# Patient Record
Sex: Female | Born: 1945 | Race: Black or African American | Hispanic: No | Marital: Married | Smoking: Former smoker
Health system: Southern US, Community
[De-identification: ages and names within clinical notes are randomized; demographics above are authoritative.]

## PROBLEM LIST (undated history)

## (undated) DIAGNOSIS — R0600 Dyspnea, unspecified: Secondary | ICD-10-CM

## (undated) DIAGNOSIS — R42 Dizziness and giddiness: Secondary | ICD-10-CM

## (undated) DIAGNOSIS — J45909 Unspecified asthma, uncomplicated: Secondary | ICD-10-CM

## (undated) DIAGNOSIS — M810 Age-related osteoporosis without current pathological fracture: Secondary | ICD-10-CM

## (undated) DIAGNOSIS — I10 Essential (primary) hypertension: Secondary | ICD-10-CM

## (undated) DIAGNOSIS — M1990 Unspecified osteoarthritis, unspecified site: Secondary | ICD-10-CM

## (undated) DIAGNOSIS — K635 Polyp of colon: Secondary | ICD-10-CM

## (undated) HISTORY — PX: COLONOSCOPY: SHX174

## (undated) HISTORY — PX: OOPHORECTOMY: SHX86

## (undated) HISTORY — PX: ABDOMINAL HYSTERECTOMY: SHX81

## (undated) HISTORY — PX: CHOLECYSTECTOMY: SHX55

---

## 2005-08-26 ENCOUNTER — Ambulatory Visit: Payer: Self-pay | Admitting: Gastroenterology

## 2007-01-27 ENCOUNTER — Ambulatory Visit: Payer: Self-pay | Admitting: Urology

## 2010-12-08 ENCOUNTER — Ambulatory Visit: Payer: Self-pay | Admitting: Gastroenterology

## 2010-12-09 LAB — PATHOLOGY REPORT

## 2014-11-21 ENCOUNTER — Encounter: Payer: Self-pay | Admitting: *Deleted

## 2014-11-22 ENCOUNTER — Ambulatory Visit: Payer: BLUE CROSS/BLUE SHIELD | Admitting: Anesthesiology

## 2014-11-22 ENCOUNTER — Encounter
Admission: RE | Disposition: A | Discharge status: Home / Self Care | Payer: Self-pay | Source: Ambulatory Visit | Attending: Gastroenterology

## 2014-11-22 ENCOUNTER — Ambulatory Visit
Admission: RE | Admit: 2014-11-22 | Discharge: 2014-11-22 | Disposition: A | Discharge status: Home / Self Care | Payer: BLUE CROSS/BLUE SHIELD | Source: Ambulatory Visit | Attending: Gastroenterology | Admitting: Gastroenterology

## 2014-11-22 DIAGNOSIS — Z79899 Other long term (current) drug therapy: Secondary | ICD-10-CM | POA: Diagnosis not present

## 2014-11-22 DIAGNOSIS — M1990 Unspecified osteoarthritis, unspecified site: Secondary | ICD-10-CM | POA: Insufficient documentation

## 2014-11-22 DIAGNOSIS — Z800 Family history of malignant neoplasm of digestive organs: Secondary | ICD-10-CM | POA: Diagnosis not present

## 2014-11-22 DIAGNOSIS — M810 Age-related osteoporosis without current pathological fracture: Secondary | ICD-10-CM | POA: Diagnosis not present

## 2014-11-22 DIAGNOSIS — Z87891 Personal history of nicotine dependence: Secondary | ICD-10-CM | POA: Insufficient documentation

## 2014-11-22 DIAGNOSIS — K5730 Diverticulosis of large intestine without perforation or abscess without bleeding: Secondary | ICD-10-CM | POA: Diagnosis not present

## 2014-11-22 DIAGNOSIS — J45909 Unspecified asthma, uncomplicated: Secondary | ICD-10-CM | POA: Insufficient documentation

## 2014-11-22 DIAGNOSIS — I10 Essential (primary) hypertension: Secondary | ICD-10-CM | POA: Insufficient documentation

## 2014-11-22 DIAGNOSIS — D127 Benign neoplasm of rectosigmoid junction: Secondary | ICD-10-CM | POA: Insufficient documentation

## 2014-11-22 DIAGNOSIS — D123 Benign neoplasm of transverse colon: Secondary | ICD-10-CM | POA: Insufficient documentation

## 2014-11-22 DIAGNOSIS — Z7982 Long term (current) use of aspirin: Secondary | ICD-10-CM | POA: Insufficient documentation

## 2014-11-22 DIAGNOSIS — Z86010 Personal history of colonic polyps: Secondary | ICD-10-CM | POA: Diagnosis present

## 2014-11-22 HISTORY — DX: Age-related osteoporosis without current pathological fracture: M81.0

## 2014-11-22 HISTORY — DX: Unspecified asthma, uncomplicated: J45.909

## 2014-11-22 HISTORY — PX: COLONOSCOPY: SHX5424

## 2014-11-22 HISTORY — DX: Unspecified osteoarthritis, unspecified site: M19.90

## 2014-11-22 HISTORY — DX: Polyp of colon: K63.5

## 2014-11-22 HISTORY — DX: Essential (primary) hypertension: I10

## 2014-11-22 SURGERY — COLONOSCOPY
Anesthesia: General

## 2014-11-22 MED ORDER — PROPOFOL 10 MG/ML IV BOLUS
INTRAVENOUS | Status: DC | PRN
  Administered 2014-11-22: 50 mg via INTRAVENOUS

## 2014-11-22 MED ORDER — SODIUM CHLORIDE 0.9 % IV SOLN
INTRAVENOUS | Status: DC
  Administered 2014-11-22: 1000 mL via INTRAVENOUS

## 2014-11-22 MED ORDER — SODIUM CHLORIDE 0.9 % IV SOLN: INTRAVENOUS | Status: DC

## 2014-11-22 MED ORDER — MIDAZOLAM HCL 5 MG/5ML IJ SOLN
INTRAMUSCULAR | Status: DC | PRN
  Administered 2014-11-22: 1 mg via INTRAVENOUS

## 2014-11-22 MED ORDER — FENTANYL CITRATE (PF) 100 MCG/2ML IJ SOLN
INTRAMUSCULAR | Status: DC | PRN
  Administered 2014-11-22: 50 ug via INTRAVENOUS

## 2014-11-22 MED ORDER — PROPOFOL 500 MG/50ML IV EMUL
INTRAVENOUS | Status: DC | PRN
Start: 2014-11-22 — End: 2014-11-22
  Administered 2014-11-22: 120 ug/kg/min via INTRAVENOUS

## 2014-11-22 NOTE — Anesthesia Postprocedure Evaluation (Signed)
  Anesthesia Post-op Note  Patient: Jaclyn Hall  Procedure(s) Performed: Procedure(s): COLONOSCOPY (N/A)  Anesthesia type:General  Patient location: PACU  Post pain: Pain level controlled  Post assessment: Post-op Vital signs reviewed, Patient's Cardiovascular Status Stable, Respiratory Function Stable, Patent Airway and No signs of Nausea or vomiting  Post vital signs: Reviewed and stable  Last Vitals:  Filed Vitals:   11/22/14 1405  BP: 168/82  Pulse: 56  Temp:   Resp: 16    Level of consciousness: awake, alert  and patient cooperative  Complications: No apparent anesthesia complications

## 2014-11-22 NOTE — Transfer of Care (Signed)
Immediate Anesthesia Transfer of Care Note  Patient: Jaclyn Hall  Procedure(s) Performed: Procedure(s): COLONOSCOPY (N/A)  Patient Location: PACU and Endoscopy Unit  Anesthesia Type:General  Level of Consciousness: awake, alert  and oriented  Airway & Oxygen Therapy: Patient Spontanous Breathing and Patient connected to nasal cannula oxygen  Post-op Assessment: Report given to RN and Post -op Vital signs reviewed and stable  Post vital signs: Reviewed and stable  Last Vitals:  Filed Vitals:   11/22/14 1340  BP:   Pulse:   Temp: 36.9 C  Resp:     Complications: No apparent anesthesia complications

## 2014-11-22 NOTE — H&P (Signed)
Outpatient short stay form Pre-procedure 11/22/2014 12:59 PM Lollie Sails MD  Primary Physician: Dr. Maryland Pink  Reason for visit:  Colonoscopy  History of present illness:  Patient is a 69 year old female presenting today for a colonoscopy. She has a personal history of colon polyps with her last colonoscopy being done in 2012. There is also a family history of colon cancer and a primary relative, a brother. She tolerated her prep well. She takes no blood thinners. She stopped taking take 81 mg aspirin several days ago and takes no other aspirin products.    Current facility-administered medications:  .  0.9 %  sodium chloride infusion, , Intravenous, Continuous, Lollie Sails, MD, Last Rate: 20 mL/hr at 11/22/14 1240, 1,000 mL at 11/22/14 1240 .  0.9 %  sodium chloride infusion, , Intravenous, Continuous, Lollie Sails, MD  Prescriptions prior to admission  Medication Sig Dispense Refill Last Dose  . amLODipine (NORVASC) 5 MG tablet Take 5 mg by mouth daily.     Marland Kitchen aspirin 81 MG tablet Take 81 mg by mouth daily.     . cetirizine (ZYRTEC) 10 MG tablet Take 10 mg by mouth daily.     . fexofenadine (ALLEGRA) 180 MG tablet Take 180 mg by mouth daily.     . fluocinonide ointment (LIDEX) 0.03 % Apply 1 application topically 2 (two) times daily.     . hydrOXYzine (ATARAX/VISTARIL) 25 MG tablet Take 25 mg by mouth 3 (three) times daily as needed.     . meloxicam (MOBIC) 15 MG tablet Take 15 mg by mouth daily.     Marland Kitchen omega-3 acid ethyl esters (LOVAZA) 1 G capsule Take by mouth daily.     Marland Kitchen triamterene-hydrochlorothiazide (MAXZIDE) 75-50 MG tablet Take by mouth daily.     . vitamin B-12 (CYANOCOBALAMIN) 1000 MCG tablet Take 1,000 mcg by mouth daily.        No Known Allergies   Past Medical History  Diagnosis Date  . Arthritis   . Asthma   . Hypertension   . Osteoporosis   . Colon polyps     Review of systems:      Physical Exam    Heart and lungs: Regular rate and  rhythm without rub or gallop, lungs are bilaterally clear    HEENT: Normocephalic atraumatic eyes are anicteric    Other:     Pertinant exam for procedure: Soft nontender nondistended bowel sounds positive normoactive    Planned proceedures: Colonoscopy and indicated procedures I have discussed the risks benefits and complications of procedures to include not limited to bleeding, infection, perforation and the risk of sedation and the patient wishes to proceed.    Lollie Sails, MD Gastroenterology 11/22/2014  12:59 PM

## 2014-11-22 NOTE — Op Note (Signed)
[facility] Gastroenterology Patient Name: Jaclyn Hall Procedure Date: 11/22/2014 1:06 PM MRN: [id number] Account #: [id number] Date of Birth: [birth year] Admit Type: Outpatient Age: 68 Room: [facility] ENDO ROOM 3 Gender: Female Note Status: Finalized Procedure:         Colonoscopy Indications:       Personal history of colonic polyps Providers:         Lollie Sails, MD Referring MD:      Irven Easterly. Kary Kos, MD (Referring MD) Medicines:         Monitored Anesthesia Care Complications:     No immediate complications. Procedure:         Pre-Anesthesia Assessment:                    - ASA Grade Assessment: III - A patient with severe                     systemic disease.                    After obtaining informed consent, the colonoscope was                     passed under direct vision. Throughout the procedure, the                     patient's blood pressure, pulse, and oxygen saturations                     were monitored continuously. The Colonoscope was                     introduced through the anus and advanced to the the cecum,                     identified by appendiceal orifice and ileocecal valve. The                     colonoscopy was performed without difficulty. The patient                     tolerated the procedure well. The quality of the bowel                     preparation was good. Findings:      A 3 mm polyp was found at the splenic flexure. The polyp was sessile.       The polyp was removed with a cold biopsy forceps. Resection and       retrieval were complete.      Four sessile polyps were found in the recto-sigmoid colon. The polyps       were less than 1 mm in size. These polyps were removed with a cold       biopsy forceps. Resection and retrieval were complete.      The retroflexed view of the distal rectum and anal verge was normal and       showed no anal or rectal abnormalities.      The digital rectal exam was normal.  Multiple small-mouthed diverticula were found in the sigmoid colon, in       the descending colon, in the transverse colon and in the ascending colon. Impression:        - One 3 mm polyp at the splenic flexure. Resected and  retrieved.                    - Four less than 1 mm polyps at the recto-sigmoid colon.                     Resected and retrieved.                    - The distal rectum and anal verge are normal on                     retroflexion view. Recommendation:    - Await pathology results.                    - Telephone GI clinic for pathology results in 1 week. Procedure Code(s): --- Professional ---                    [phone], Colonoscopy, flexible; with biopsy, single or                     multiple Diagnosis Code(s): --- Professional ---                    211.3, Benign neoplasm of colon                    211.4, Benign neoplasm of rectum and anal canal                    V12.72, Personal history of colonic polyps CPT copyright 2014 American Medical Association. All rights reserved. The codes documented in this report are preliminary and upon coder review may  be revised to meet current compliance requirements. Lollie Sails, MD 11/22/2014 1:32:47 PM This report has been signed electronically. Number of Addenda: 0 Note Initiated On: 11/22/2014 1:06 PM Scope Withdrawal Time: 0 hours 10 minutes 26 seconds  Total Procedure Duration: 0 hours 17 minutes 46 seconds       [facility]

## 2014-11-22 NOTE — Anesthesia Procedure Notes (Signed)
Date/Time: 11/22/2014 1:10 PM Performed by: Kennon Holter Pre-anesthesia Checklist: Patient identified, Emergency Drugs available, Suction available, Patient being monitored and Timeout performed Oxygen Delivery Method: Nasal cannula Preoxygenation: Pre-oxygenation with 100% oxygen Intubation Type: IV induction

## 2014-11-22 NOTE — Anesthesia Preprocedure Evaluation (Signed)
Anesthesia Evaluation  Patient identified by MRN, date of birth, ID band Patient awake    Reviewed: Allergy & Precautions, H&P , NPO status , Patient's Chart, lab work & pertinent test results  History of Anesthesia Complications Negative for: history of anesthetic complications  Airway Mallampati: III  TM Distance: >3 FB Neck ROM: limited    Dental no notable dental hx. (+) Teeth Intact   Pulmonary neg shortness of breath, asthma , former smoker,    Pulmonary exam normal breath sounds clear to auscultation       Cardiovascular Exercise Tolerance: Good hypertension, (-) angina(-) Past MI Normal cardiovascular exam Rhythm:regular Rate:Normal     Neuro/Psych negative neurological ROS  negative psych ROS   GI/Hepatic negative GI ROS, Neg liver ROS, neg GERD  ,  Endo/Other  negative endocrine ROS  Renal/GU negative Renal ROS  negative genitourinary   Musculoskeletal  (+) Arthritis ,   Abdominal   Peds  Hematology negative hematology ROS (+)   Anesthesia Other Findings Past Medical History:   Arthritis                                                    Asthma                                                       Hypertension                                                 Osteoporosis                                                 Colon polyps                                                Past Surgical History:   ABDOMINAL HYSTERECTOMY                                        CHOLECYSTECTOMY                                               OOPHORECTOMY                                                  COLONOSCOPY  BMI    Body Mass Index   37.80 kg/m 2      Reproductive/Obstetrics negative OB ROS                             Anesthesia Physical Anesthesia Plan  ASA: III  Anesthesia Plan: General   Post-op Pain Management:     Induction:   Airway Management Planned:   Additional Equipment:   Intra-op Plan:   Post-operative Plan:   Informed Consent: I have reviewed the patients History and Physical, chart, labs and discussed the procedure including the risks, benefits and alternatives for the proposed anesthesia with the patient or authorized representative who has indicated his/her understanding and acceptance.   Dental Advisory Given  Plan Discussed with: Anesthesiologist, CRNA and Surgeon  Anesthesia Plan Comments:         Anesthesia Quick Evaluation

## 2014-11-25 ENCOUNTER — Encounter: Payer: Self-pay | Admitting: Gastroenterology

## 2014-11-26 LAB — SURGICAL PATHOLOGY

## 2015-06-30 DIAGNOSIS — H8113 Benign paroxysmal vertigo, bilateral: Secondary | ICD-10-CM | POA: Insufficient documentation

## 2015-07-15 ENCOUNTER — Ambulatory Visit: Payer: BLUE CROSS/BLUE SHIELD | Attending: Neurology

## 2015-07-15 VITALS — BP 151/58 | HR 61

## 2015-07-15 DIAGNOSIS — R42 Dizziness and giddiness: Secondary | ICD-10-CM | POA: Insufficient documentation
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
## 2015-07-15 NOTE — Patient Instructions (Signed)
Pt issued VOR x 1 horizontal, 60 seconds x 3, 4 times/day

## 2015-07-23 ENCOUNTER — Ambulatory Visit: Payer: BLUE CROSS/BLUE SHIELD | Attending: Neurology | Admitting: Physical Therapy

## 2015-07-23 DIAGNOSIS — R42 Dizziness and giddiness: Secondary | ICD-10-CM | POA: Insufficient documentation
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
## 2015-07-30 ENCOUNTER — Encounter: Payer: Self-pay | Admitting: Physical Therapy

## 2015-07-30 ENCOUNTER — Ambulatory Visit: Payer: BLUE CROSS/BLUE SHIELD | Admitting: Physical Therapy

## 2015-07-30 DIAGNOSIS — R42 Dizziness and giddiness: Secondary | ICD-10-CM | POA: Diagnosis not present
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
## 2015-08-05 ENCOUNTER — Encounter: Payer: BLUE CROSS/BLUE SHIELD | Admitting: Physical Therapy

## 2015-08-08 ENCOUNTER — Ambulatory Visit: Payer: BLUE CROSS/BLUE SHIELD | Admitting: Physical Therapy

## 2015-08-08 ENCOUNTER — Encounter: Payer: Self-pay | Admitting: Physical Therapy

## 2015-08-08 DIAGNOSIS — R42 Dizziness and giddiness: Secondary | ICD-10-CM
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
## 2015-08-12 ENCOUNTER — Encounter: Payer: BLUE CROSS/BLUE SHIELD | Admitting: Physical Therapy

## 2015-08-19 ENCOUNTER — Encounter: Payer: BLUE CROSS/BLUE SHIELD | Admitting: Physical Therapy

## 2015-08-22 ENCOUNTER — Encounter: Payer: BLUE CROSS/BLUE SHIELD | Admitting: Physical Therapy

## 2015-08-26 ENCOUNTER — Encounter: Payer: BLUE CROSS/BLUE SHIELD | Admitting: Physical Therapy

## 2015-08-29 ENCOUNTER — Encounter: Payer: BLUE CROSS/BLUE SHIELD | Admitting: Physical Therapy

## 2016-02-23 ENCOUNTER — Emergency Department
Admission: EM | Admit: 2016-02-23 | Discharge: 2016-02-23 | Disposition: A | Discharge status: Home / Self Care | Payer: BLUE CROSS/BLUE SHIELD | Attending: Emergency Medicine | Admitting: Emergency Medicine

## 2016-02-23 ENCOUNTER — Encounter: Payer: Self-pay | Admitting: Emergency Medicine

## 2016-02-23 ENCOUNTER — Emergency Department: Payer: BLUE CROSS/BLUE SHIELD

## 2016-02-23 DIAGNOSIS — H81392 Other peripheral vertigo, left ear: Secondary | ICD-10-CM | POA: Diagnosis not present

## 2016-02-23 DIAGNOSIS — Z87891 Personal history of nicotine dependence: Secondary | ICD-10-CM | POA: Diagnosis not present

## 2016-02-23 DIAGNOSIS — R42 Dizziness and giddiness: Secondary | ICD-10-CM

## 2016-02-23 DIAGNOSIS — I10 Essential (primary) hypertension: Secondary | ICD-10-CM | POA: Insufficient documentation

## 2016-02-23 DIAGNOSIS — Z79899 Other long term (current) drug therapy: Secondary | ICD-10-CM | POA: Diagnosis not present

## 2016-02-23 DIAGNOSIS — J45909 Unspecified asthma, uncomplicated: Secondary | ICD-10-CM | POA: Insufficient documentation

## 2016-02-23 HISTORY — DX: Dizziness and giddiness: R42

## 2016-02-23 LAB — CBC
HCT: 43.5 % (ref 35.0–47.0)
Hemoglobin: 15.2 g/dL (ref 12.0–16.0)
MCH: 31 pg (ref 26.0–34.0)
MCHC: 35 g/dL (ref 32.0–36.0)
MCV: 88.6 fL (ref 80.0–100.0)
PLATELETS: 277 10*3/uL (ref 150–440)
RBC: 4.91 MIL/uL (ref 3.80–5.20)
RDW: 13 % (ref 11.5–14.5)
WBC: 4.3 10*3/uL (ref 3.6–11.0)

## 2016-02-23 LAB — BASIC METABOLIC PANEL
Anion gap: 8 (ref 5–15)
BUN: 16 mg/dL (ref 6–20)
CHLORIDE: 104 mmol/L (ref 101–111)
CO2: 25 mmol/L (ref 22–32)
CREATININE: 0.96 mg/dL (ref 0.44–1.00)
Calcium: 9.7 mg/dL (ref 8.9–10.3)
GFR calc Af Amer: >60 mL/min (ref >60)
GFR calc non Af Amer: 59 mL/min — ABNORMAL LOW (ref >60)
GLUCOSE: 93 mg/dL (ref 65–99)
Potassium: 3.7 mmol/L (ref 3.5–5.1)
Sodium: 137 mmol/L (ref 135–145)

## 2016-02-23 MED ORDER — ONDANSETRON HCL 4 MG PO TABS: 4.0000 mg | ORAL_TABLET | Freq: Three times a day (TID) | ORAL | 0 refills | Status: DC | PRN

## 2016-02-23 MED ORDER — MECLIZINE HCL 12.5 MG PO TABS: 12.5000 mg | ORAL_TABLET | Freq: Three times a day (TID) | ORAL | 1 refills | Status: DC | PRN

## 2016-02-23 MED ORDER — MECLIZINE HCL 25 MG PO TABS
25.0000 mg | ORAL_TABLET | Freq: Once | ORAL | Status: AC
  Administered 2016-02-23: 25 mg via ORAL
  Filled 2016-02-23: qty 1

## 2016-02-23 MED ORDER — ONDANSETRON 4 MG PO TBDP
4.0000 mg | ORAL_TABLET | Freq: Once | ORAL | Status: AC
  Administered 2016-02-23: 4 mg via ORAL
  Filled 2016-02-23: qty 1

## 2016-02-23 NOTE — ED Notes (Signed)
D/c inst to pt and family.   

## 2016-02-23 NOTE — Discharge Instructions (Signed)
Please return immediately if condition worsens. Please contact her primary physician or the physician you were given for referral. If you have any specialist physicians involved in her treatment and plan please also contact them. Thank you for using [place] regional emergency Department. ° °

## 2016-02-23 NOTE — ED Provider Notes (Signed)
Time Seen: Approximately1843 I have reviewed the triage notes  Chief Complaint: Dizziness   History of Present Illness: Jaclyn Hall is a 71 y.o. female who states a long history of intermittent peripheral vertigo. She has seen a neurologist in the past for similar symptoms. She states she was referred here by her primary physician for evaluation of vertigo since it seemed to be relatively persistent with intermittent episodes over the past week. No focal weakness, trouble with speech or swallowing. She denies any trouble with ambulation. Spinning sensation is exclusively with movement. She denies any fever. She denies any head trauma. She describes her dizziness is exclusively when she looks to the left.  Past Medical History:  Diagnosis Date  . Arthritis   . Asthma   . Colon polyps   . Hypertension   . Osteoporosis   . Vertigo     There are no active problems to display for this patient.   Past Surgical History:  Procedure Laterality Date  . ABDOMINAL HYSTERECTOMY    . CHOLECYSTECTOMY    . COLONOSCOPY    . COLONOSCOPY N/A 11/22/2014   Procedure: COLONOSCOPY;  Surgeon: Lollie Sails, MD;  Location: [facility] ENDOSCOPY;  Service: Endoscopy;  Laterality: N/A;  . OOPHORECTOMY      Past Surgical History:  Procedure Laterality Date  . ABDOMINAL HYSTERECTOMY    . CHOLECYSTECTOMY    . COLONOSCOPY    . COLONOSCOPY N/A 11/22/2014   Procedure: COLONOSCOPY;  Surgeon: Lollie Sails, MD;  Location: [facility] ENDOSCOPY;  Service: Endoscopy;  Laterality: N/A;  . OOPHORECTOMY      Current Outpatient Rx  . Order #: [id number] Class: Historical Med  . Order #: [id number] Class: Historical Med  . Order #: [id number] Class: Historical Med  . Order #: [id number] Class: Historical Med  . Order #: [id number] Class: Historical Med  . Order #: [id number] Class: Historical Med  . Order #: [id number] Class: Historical Med  . Order #: [id number] Class: Historical Med  . Order #: [id number] Class:  Historical Med  . Order #: [id number] Class: Historical Med  . Order #: [id number] Class: Historical Med  . Order #: [id number] Class: Historical Med    Allergies:  Patient has no known allergies.  Family History: No family history on file.  Social History: Social History  Substance Use Topics  . Smoking status: Former Research scientist (life sciences)  . Smokeless tobacco: Never Used  . Alcohol use No     Review of Systems:   10 point review of systems was performed and was otherwise negative:  Constitutional: No fever Eyes: No visual disturbances. No blind spots ENT: No sore throat, ear pain Cardiac: No chest pain Respiratory: No shortness of breath, wheezing, or stridor Abdomen: No abdominal pain, no vomiting, No diarrhea Endocrine: No weight loss, No night sweats Extremities: No peripheral edema, cyanosis Skin: No rashes, easy bruising Neurologic: No focal weakness, trouble with speech or swollowing Urologic: No dysuria, Hematuria, or urinary frequency   Physical Exam:  ED Triage Vitals [02/23/16 1433]  Enc Vitals Group     BP (!) 194/64     Pulse Rate 71     Resp 20     Temp 98.3 F (36.8 C)     Temp Source Oral     SpO2 100 %     Weight 190 lb (86.2 kg)     Height 5\' 1"  (1.549 m)     Head Circumference      Peak Flow      Pain Score      Pain Loc  Pain Edu?      Excl. in [place]?     General: Awake , Alert , and Oriented times 3; GCS 15 Head: Normal cephalic , atraumatic Eyes: Pupils equal , round, reactive to light. Right lateral nystagmus which is exhaustive Nose/Throat: No nasal drainage, patent upper airway without erythema or exudate.  Neck: Supple, Full range of motion, No anterior adenopathy or palpable thyroid masses Lungs: Clear to ascultation without wheezes , rhonchi, or rales Heart: Regular rate, regular rhythm without murmurs , gallops , or rubs Abdomen: Soft, non tender without rebound, guarding , or rigidity; bowel sounds positive and symmetric in all 4 quadrants.  No organomegaly .        Extremities: 2 plus symmetric pulses. No edema, clubbing or cyanosis Neurologic: normal ambulation, Motor symmetric without deficits, sensory intact Skin: warm, dry, no rashes   Labs:   All laboratory work was reviewed including any pertinent negatives or positives listed below:  Labs Reviewed  BASIC METABOLIC PANEL - Abnormal; Notable for the following:       Result Value   GFR calc non Af Amer 59 (*)    All other components within normal limits  CBC  Laboratory work was reviewed and showed no clinically significant abnormalities.   EKG:  ED ECG REPORT I, Daymon Larsen, the attending physician, personally viewed and interpreted this ECG.  Date: 02/23/2016 EKG Time: 1402 Rate: 62 Rhythm: normal sinus rhythm QRS Axis: normal Intervals: normal ST/T Wave abnormalities: normal Conduction Disturbances: none Narrative Interpretation: unremarkable Left ventricular hypertrophy  No acute ischemic changes   Radiology: * "Ct Head Wo Contrast  Result Date: 02/23/2016 CLINICAL DATA:  Dizziness and vertigo EXAM: CT HEAD WITHOUT CONTRAST TECHNIQUE: Contiguous axial images were obtained from the base of the skull through the vertex without intravenous contrast. COMPARISON:  None. FINDINGS: Brain: No mass lesion, intraparenchymal hemorrhage or extra-axial collection. No evidence of acute cortical infarct. Brain parenchyma and CSF-containing spaces are normal for age. Vascular: No hyperdense vessel or unexpected calcification. Skull: Normal visualized skull base, calvarium and extracranial soft tissues. Sinuses/Orbits: No sinus fluid levels or advanced mucosal thickening. No mastoid effusion. Normal orbits. IMPRESSION: Normal head CT. Electronically Signed   By: Ulyses Jarred M.D.   On: 02/23/2016 19:32  "   I personally reviewed the radiologic studies     ED Course:  Patient's stay here was uneventful and I felt her vertigo is most likely peripheral vertigo  based on her clinical presentation and objective findings. Patient was advised continue with outpatient follow-up and was prescribed Antivert and Zofran for nausea. She was advised take Tylenol for the mild abdominal pain that she has some dry heaves. She was advised to return here if she has any focal weakness, increasing headache, persistent vertiginous symptoms even if she is lying still in the bed.     Assessment:  Peripheral vertigo     Plan: * Outpatient " New Prescriptions   MECLIZINE (ANTIVERT) 12.5 MG TABLET    Take 1 tablet (12.5 mg total) by mouth 3 (three) times daily as needed for dizziness or nausea.   ONDANSETRON (ZOFRAN) 4 MG TABLET    Take 1 tablet (4 mg total) by mouth every 8 (eight) hours as needed for nausea or vomiting.  " Patient was advised to return immediately if condition worsens. Patient was advised to follow up with their primary care physician or other specialized physicians involved in their outpatient care. The patient and/or family member/power of attorney had laboratory  results reviewed at the bedside. All questions and concerns were addressed and appropriate discharge instructions were distributed by the nursing staff.             Daymon Larsen, MD 02/23/16 2002

## 2016-02-23 NOTE — ED Notes (Signed)
Pt reports dizziness for 2 years.  Pt states recent episode lasting for 1 week.  Pt taking meclizine without relief. No chest pain or sob.  Intermittent nausea.  Pt alert.  Speech clear.  Family with pt.

## 2016-02-23 NOTE — ED Triage Notes (Signed)
Pt with vertigo since last week, comes and goes. Has been taking meclizine and is not helping. Pt states her dr sent her here.

## 2016-04-14 ENCOUNTER — Ambulatory Visit
Admission: RE | Admit: 2016-04-14 | Discharge: 2016-04-14 | Disposition: A | Discharge status: Home / Self Care | Payer: BLUE CROSS/BLUE SHIELD | Source: Ambulatory Visit | Attending: Medical | Admitting: Medical

## 2016-04-14 ENCOUNTER — Encounter: Payer: Self-pay | Admitting: Medical

## 2016-04-14 ENCOUNTER — Ambulatory Visit: Payer: Self-pay | Admitting: Medical

## 2016-04-14 VITALS — BP 200/88 | HR 66 | Temp 97.6°F | Resp 16 | Ht 62.0 in | Wt 207.0 lb

## 2016-04-14 DIAGNOSIS — J40 Bronchitis, not specified as acute or chronic: Secondary | ICD-10-CM

## 2016-04-14 DIAGNOSIS — R05 Cough: Secondary | ICD-10-CM | POA: Diagnosis present

## 2016-04-14 DIAGNOSIS — R062 Wheezing: Secondary | ICD-10-CM | POA: Diagnosis not present

## 2016-04-14 DIAGNOSIS — R0602 Shortness of breath: Secondary | ICD-10-CM | POA: Diagnosis not present

## 2016-04-14 DIAGNOSIS — R059 Cough, unspecified: Secondary | ICD-10-CM

## 2016-04-14 MED ORDER — BENZONATATE 100 MG PO CAPS: 100.0000 mg | ORAL_CAPSULE | Freq: Two times a day (BID) | ORAL | 0 refills | Status: DC | PRN

## 2016-04-14 MED ORDER — ALBUTEROL SULFATE HFA 108 (90 BASE) MCG/ACT IN AERS: 2.0000 | INHALATION_SPRAY | Freq: Four times a day (QID) | RESPIRATORY_TRACT | 1 refills | Status: DC | PRN

## 2016-04-14 NOTE — Progress Notes (Signed)
Subjective:    Patient ID: Jaclyn Hall, female    DOB: February 05, 1945, 71 y.o.   MRN: [id number]  HPI Started 10 days with cough and bodyaches, stayed in bed for  3 days, with fever and chills ( "flu like symtoms").  Seemed to get better after 3 days but now has a lingering cough that is productive white and clear and thick.She has an expired albuterol MDI and would like a refill for her wheezing. Her grandchildren were over her house the weekend before and they had the flu and this is where she thinks she got it.    Review of Systems  Constitutional: Negative for chills, fatigue and fever.  HENT: Positive for congestion and sneezing. Negative for sore throat.   Eyes: Negative.   Respiratory: Positive for cough, chest tightness, shortness of breath and wheezing.   Cardiovascular: Negative for chest pain.  Gastrointestinal: Positive for nausea. Negative for diarrhea, rectal pain and vomiting.  Genitourinary: Negative for frequency and hematuria.  Musculoskeletal: Positive for back pain and myalgias.  Skin: Negative for rash and wound.  Neurological: Positive for dizziness, light-headedness and headaches. Negative for syncope.  Hematological: Negative for adenopathy.  Psychiatric/Behavioral: Negative for hallucinations and self-injury.   A lot of these are symptoms she says she had last week. Back pain and body aches, nausea, shortness of breath during the  3 days she spent in bed.. She continues to wheeze so that is what brought her in today. Now with soreness in sides of chest and back from coughing so much.  Patient had taken her maxide and norvasc at 5:30 am today.    Objective:   Physical Exam  Constitutional: She is oriented to person, place, and time. She appears well-developed and well-nourished.  HENT:  Head: Normocephalic and atraumatic.  Eyes: EOM are normal. Pupils are equal, round, and reactive to light.  Neck: Normal range of motion. Neck supple.  Cardiovascular: Normal  rate, regular rhythm and normal heart sounds.  Exam reveals no gallop and no friction rub.   No murmur heard. Pulmonary/Chest: Effort normal. She has wheezes.  Musculoskeletal: Normal range of motion.  Lymphadenopathy:    She has no cervical adenopathy.  Neurological: She is alert and oriented to person, place, and time.  Skin: Skin is warm and dry.  Psychiatric: She has a normal mood and affect. Her behavior is normal.  Nursing note and vitals reviewed.   s/p albuterol nebuliz treatment wheezing resolved but rhonci present.. Patient in no distress and states she feels better.      Assessment & Plan:  Viral Bronchitis s/p influenza ( last week). Albuterol nebulizer in clinic, wheezing  Almost resolved but still present on inspiration some rhonchi in bases. Sent patient for chest xray. Chest xray within normal limits. Called patient with results.  Prescribed Albuterol MDI  2 puffs every 6 hours as needed for cough, wheezing or shortness of breath. Benzonatate perles 100mg  one -two by mouth every eight hours #30 no rx Tussionex 30ml by mouth every 12 hours may cause sedation 30cc no rx Reviewed with patient to use one or the other of the cough medications , she verbalizes understanding. Since she had already taken her blood pressure medication , I recommended she contact her PCP and see if she could take another Norvasc 5mg  pill.  I also reviewed with patient that headaches and dizziness can be a symptom of high blood pressure. She says she will call him today. She is to return to  the clinic as needed.

## 2016-04-21 ENCOUNTER — Ambulatory Visit: Payer: Self-pay | Admitting: Medical

## 2017-03-14 ENCOUNTER — Encounter: Payer: Self-pay | Admitting: Medical

## 2017-03-14 ENCOUNTER — Ambulatory Visit: Payer: Self-pay | Admitting: Registered Nurse

## 2017-03-14 VITALS — BP 182/75 | HR 60 | Temp 98.2°F | Resp 18 | Ht 62.0 in | Wt 200.0 lb

## 2017-03-14 DIAGNOSIS — J302 Other seasonal allergic rhinitis: Secondary | ICD-10-CM

## 2017-03-14 DIAGNOSIS — M810 Age-related osteoporosis without current pathological fracture: Secondary | ICD-10-CM | POA: Insufficient documentation

## 2017-03-14 DIAGNOSIS — H6993 Unspecified Eustachian tube disorder, bilateral: Secondary | ICD-10-CM

## 2017-03-14 DIAGNOSIS — R42 Dizziness and giddiness: Secondary | ICD-10-CM

## 2017-03-14 DIAGNOSIS — I10 Essential (primary) hypertension: Secondary | ICD-10-CM | POA: Insufficient documentation

## 2017-03-14 DIAGNOSIS — M1990 Unspecified osteoarthritis, unspecified site: Secondary | ICD-10-CM | POA: Insufficient documentation

## 2017-03-14 DIAGNOSIS — T7840XA Allergy, unspecified, initial encounter: Secondary | ICD-10-CM | POA: Insufficient documentation

## 2017-03-14 DIAGNOSIS — H6983 Other specified disorders of Eustachian tube, bilateral: Secondary | ICD-10-CM

## 2017-03-14 MED ORDER — ACETAMINOPHEN 500 MG PO TABS: 1000.0000 mg | ORAL_TABLET | Freq: Four times a day (QID) | ORAL | 0 refills | Status: AC | PRN

## 2017-03-14 MED ORDER — FLUCONAZOLE 150 MG PO TABS: 150.0000 mg | ORAL_TABLET | Freq: Once | ORAL | 0 refills | Status: AC

## 2017-03-14 MED ORDER — AMOXICILLIN-POT CLAVULANATE 875-125 MG PO TABS: 1.0000 | ORAL_TABLET | Freq: Two times a day (BID) | ORAL | 0 refills | Status: DC

## 2017-03-14 NOTE — Patient Instructions (Signed)
Start taking meclizine 12.5mg  by mouth every 12 hours Continue allegra 180mg  by mouth daily If worsening ear pain despite allegra and meclizine and tylenol start augmentin Vaginal Yeast infection, Adult Vaginal yeast infection is a condition that causes soreness, swelling, and redness (inflammation) of the vagina. It also causes vaginal discharge. This is a common condition. Some women get this infection frequently. What are the causes? This condition is caused by a change in the normal balance of the yeast (candida) and bacteria that live in the vagina. This change causes an overgrowth of yeast, which causes the inflammation. What increases the risk? This condition is more likely to develop in: Women who take antibiotic medicines. Women who have diabetes. Women who take birth control pills. Women who are pregnant. Women who douche often. Women who have a weak defense (immune) system. Women who have been taking steroid medicines for a long time. Women who frequently wear tight clothing.  What are the signs or symptoms? Symptoms of this condition include: White, thick vaginal discharge. Swelling, itching, redness, and irritation of the vagina. The lips of the vagina (vulva) may be affected as well. Pain or a burning feeling while urinating. Pain during sex.  How is this diagnosed? This condition is diagnosed with a medical history and physical exam. This will include a pelvic exam. Your health care provider will examine a sample of your vaginal discharge under a microscope. Your health care provider may send this sample for testing to confirm the diagnosis. How is this treated? This condition is treated with medicine. Medicines may be over-the-counter or prescription. You may be told to use one or more of the following: Medicine that is taken orally. Medicine that is applied as a cream. Medicine that is inserted directly into the vagina (suppository).  Follow these instructions at  home: Take or apply over-the-counter and prescription medicines only as told by your health care provider. Do not have sex until your health care provider has approved. Tell your sex partner that you have a yeast infection. That person should go to his or her health care provider if he or she develops symptoms. Do not wear tight clothes, such as pantyhose or tight pants. Avoid using tampons until your health care provider approves. Eat more yogurt. This may help to keep your yeast infection from returning. Try taking a sitz bath to help with discomfort. This is a warm water bath that is taken while you are sitting down. The water should only come up to your hips and should cover your buttocks. Do this 3-4 times per day or as told by your health care provider. Do not douche. Wear breathable, cotton underwear. If you have diabetes, keep your blood sugar levels under control. Contact a health care provider if: You have a fever. Your symptoms go away and then return. Your symptoms do not get better with treatment. Your symptoms get worse. You have new symptoms. You develop blisters in or around your vagina. You have blood coming from your vagina and it is not your menstrual period. You develop pain in your abdomen. This information is not intended to replace advice given to you by your health care provider. Make sure you discuss any questions you have with your health care provider. Document Released: 10/14/2004 Document Revised: 06/18/2015 Document Reviewed: 07/08/2014 Elsevier Interactive Patient Education  2018 [employer].   Vertigo Vertigo is the feeling that you or your surroundings are moving when they are not. Vertigo can be dangerous if it occurs while  you are doing something that could endanger you or others, such as driving. What are the causes? This condition is caused by a disturbance in the signals that are sent by your body's sensory systems to your brain. Different causes of  a disturbance can lead to vertigo, including: Infections, especially in the inner ear. A bad reaction to a drug, or misuse of alcohol and medicines. Withdrawal from drugs or alcohol. Quickly changing positions, as when lying down or rolling over in bed. Migraine headaches. Decreased blood flow to the brain. Decreased blood pressure. Increased pressure in the brain from a head or neck injury, stroke, infection, tumor, or bleeding. Central nervous system disorders.  What are the signs or symptoms? Symptoms of this condition usually occur when you move your head or your eyes in different directions. Symptoms may start suddenly, and they usually last for less than a minute. Symptoms may include: Loss of balance and falling. Feeling like you are spinning or moving. Feeling like your surroundings are spinning or moving. Nausea and vomiting. Blurred vision or double vision. Difficulty hearing. Slurred speech. Dizziness. Involuntary eye movement (nystagmus).  Symptoms can be mild and cause only slight annoyance, or they can be severe and interfere with daily life. Episodes of vertigo may return (recur) over time, and they are often triggered by certain movements. Symptoms may improve over time. How is this diagnosed? This condition may be diagnosed based on medical history and the quality of your nystagmus. Your health care provider may test your eye movements by asking you to quickly change positions to trigger the nystagmus. This may be called the Dix-Hallpike test, head thrust test, or roll test. You may be referred to a health care provider who specializes in ear, nose, and throat (ENT) problems (otolaryngologist) or a provider who specializes in disorders of the central nervous system (neurologist). You may have additional testing, including: A physical exam. Blood tests. MRI. A CT scan. An electrocardiogram (ECG). This records electrical activity in your heart. An electroencephalogram  (EEG). This records electrical activity in your brain. Hearing tests.  How is this treated? Treatment for this condition depends on the cause and the severity of the symptoms. Treatment options include: Medicines to treat nausea or vertigo. These are usually used for severe cases. Some medicines that are used to treat other conditions may also reduce or eliminate vertigo symptoms. These include: Medicines that control allergies (antihistamines). Medicines that control seizures (anticonvulsants). Medicines that relieve depression (antidepressants). Medicines that relieve anxiety (sedatives). Head movements to adjust your inner ear back to normal. If your vertigo is caused by an ear problem, your health care provider may recommend certain movements to correct the problem. Surgery. This is rare.  Follow these instructions at home: Safety Move slowly.Avoid sudden body or head movements. Avoid driving. Avoid operating heavy machinery. Avoid doing any tasks that would cause danger to you or others if you would have a vertigo episode during the task. If you have trouble walking or keeping your balance, try using a cane for stability. If you feel dizzy or unstable, sit down right away. Return to your normal activities as told by your health care provider. Ask your health care provider what activities are safe for you. General instructions Take over-the-counter and prescription medicines only as told by your health care provider. Avoid certain positions or movements as told by your health care provider. Drink enough fluid to keep your urine clear or pale yellow. Keep all follow-up visits as told by your health  care provider. This is important. Contact a health care provider if: Your medicines do not relieve your vertigo or they make it worse. You have a fever. Your condition gets worse or you develop new symptoms. Your family or friends notice any behavioral changes. Your nausea or vomiting  gets worse. You have numbness or a "pins and needles" sensation in part of your body. Get help right away if: You have difficulty moving or speaking. You are always dizzy. You faint. You develop severe headaches. You have weakness in your hands, arms, or legs. You have changes in your hearing or vision. You develop a stiff neck. You develop sensitivity to light. This information is not intended to replace advice given to you by your health care provider. Make sure you discuss any questions you have with your health care provider. Document Released: 10/14/2004 Document Revised: 06/18/2015 Document Reviewed: 04/29/2014 Elsevier Interactive Patient Education  2018 [employer]. Otitis Media, Adult Otitis media occurs when there is inflammation and fluid in the middle ear. Your middle ear is a part of the ear that contains bones for hearing as well as air that helps send sounds to your brain. What are the causes? This condition is caused by a blockage in the eustachian tube. This tube drains fluid from the ear to the back of the nose (nasopharynx). A blockage in this tube can be caused by an object or by swelling (edema) in the tube. Problems that can cause a blockage include:  A cold or other upper respiratory infection.  Allergies.  An irritant, such as tobacco smoke.  Enlarged adenoids. The adenoids are areas of soft tissue located high in the back of the throat, behind the nose and the roof of the mouth.  A mass in the nasopharynx.  Damage to the ear caused by pressure changes (barotrauma).  What are the signs or symptoms? Symptoms of this condition include:  Ear pain.  A fever.  Decreased hearing.  A headache.  Tiredness (lethargy).  Fluid leaking from the ear.  Ringing in the ear.  How is this diagnosed? This condition is diagnosed with a physical exam. During the exam your health care provider will use an instrument called an otoscope to look into your ear and  check for redness, swelling, and fluid. He or she will also ask about your symptoms. Your health care provider may also order tests, such as:  A test to check the movement of the eardrum (pneumatic otoscopy). This test is done by squeezing a small amount of air into the ear.  A test that changes air pressure in the middle ear to check how well the eardrum moves and whether the eustachian tube is working (tympanogram).  How is this treated? This condition usually goes away on its own within 3-5 days. But if the condition is caused by a bacteria infection and does not go away own its own, or keeps coming back, your health care provider may:  Prescribe antibiotic medicines to treat the infection.  Prescribe or recommend medicines to control pain.  Follow these instructions at home:  Take over-the-counter and prescription medicines only as told by your health care provider.  If you were prescribed an antibiotic medicine, take it as told by your health care provider. Do not stop taking the antibiotic even if you start to feel better.  Keep all follow-up visits as told by your health care provider. This is important. Contact a health care provider if:  You have bleeding from your nose.  There is a lump on your neck.  You are not getting better in 5 days.  You feel worse instead of better. Get help right away if:  You have severe pain that is not controlled with medicine.  You have swelling, redness, or pain around your ear.  You have stiffness in your neck.  A part of your face is paralyzed.  The bone behind your ear (mastoid) is tender when you touch it.  You develop a severe headache. Summary  Otitis media is redness, soreness, and swelling of the middle ear.  This condition usually goes away on its own within 3-5 days.  If the problem does not go away in 3-5 days, your health care provider may prescribe or recommend medicines to treat your symptoms.  If you were  prescribed an antibiotic medicine, take it as told by your health care provider. This information is not intended to replace advice given to you by your health care provider. Make sure you discuss any questions you have with your health care provider. Document Released: 10/10/2003 Document Revised: 12/26/2015 Document Reviewed: 12/26/2015 Elsevier Interactive Patient Education  2018 [place]. Eustachian Tube Dysfunction The eustachian tube connects the middle ear to the back of the nose. It regulates air pressure in the middle ear by allowing air to move between the ear and nose. It also helps to drain fluid from the middle ear space. When the eustachian tube does not function properly, air pressure, fluid, or both can build up in the middle ear. Eustachian tube dysfunction can affect one or both ears. What are the causes? This condition happens when the eustachian tube becomes blocked or cannot open normally. This may result from:  Ear infections.  Colds and other upper respiratory infections.  Allergies.  Irritation, such as from cigarette smoke or acid from the stomach coming up into the esophagus (gastroesophageal reflux).  Sudden changes in air pressure, such as from descending in an airplane.  Abnormal growths in the nose or throat, such as nasal polyps, tumors, or enlarged tissue at the back of the throat (adenoids).  What increases the risk? This condition may be more likely to develop in people who smoke and people who are overweight. Eustachian tube dysfunction may also be more likely to develop in children, especially children who have:  Certain birth defects of the mouth, such as cleft palate.  Large tonsils and adenoids.  What are the signs or symptoms? Symptoms of this condition may include:  A feeling of fullness in the ear.  Ear pain.  Clicking or popping noises in the ear.  Ringing in the ear.  Hearing loss.  Loss of balance.  Symptoms may get worse  when the air pressure around you changes, such as when you travel to an area of high elevation or fly on an airplane. How is this diagnosed? This condition may be diagnosed based on:  Your symptoms.  A physical exam of your ear, nose, and throat.  Tests, such as those that measure: ? The movement of your eardrum (tympanogram). ? Your hearing (audiometry).  How is this treated? Treatment depends on the cause and severity of your condition. If your symptoms are mild, you may be able to relieve your symptoms by moving air into ("popping") your ears. If you have symptoms of fluid in your ears, treatment may include:  Decongestants.  Antihistamines.  Nasal sprays or ear drops that contain medicines that reduce swelling (steroids).  In some cases, you may need to have  a procedure to drain the fluid in your eardrum (myringotomy). In this procedure, a small tube is placed in the eardrum to:  Drain the fluid.  Restore the air in the middle ear space.  Follow these instructions at home:  Take over-the-counter and prescription medicines only as told by your health care provider.  Use techniques to help pop your ears as recommended by your health care provider. These may include: ? Chewing gum. ? Yawning. ? Frequent, forceful swallowing. ? Closing your mouth, holding your nose closed, and gently blowing as if you are trying to blow air out of your nose.  Do not do any of the following until your health care provider approves: ? Travel to high altitudes. ? Fly in airplanes. ? Work in a [employer] scheme manager or room. ? Scuba dive.  Keep your ears dry. Dry your ears completely after showering or bathing.  Do not smoke.  Keep all follow-up visits as told by your health care provider. This is important. Contact a health care provider if:  Your symptoms do not go away after treatment.  Your symptoms come back after treatment.  You are unable to pop your ears.  You have: ? A  fever. ? Pain in your ear. ? Pain in your head or neck. ? Fluid draining from your ear.  Your hearing suddenly changes.  You become very dizzy.  You lose your balance. This information is not intended to replace advice given to you by your health care provider. Make sure you discuss any questions you have with your health care provider. Document Released: 01/31/2015 Document Revised: 06/12/2015 Document Reviewed: 01/23/2014 Elsevier Interactive Patient Education  [employer].

## 2017-03-14 NOTE — Progress Notes (Signed)
Subjective:    Patient ID: Jaclyn Hall, female    DOB: [birth year], 72 y.o.   MRN: [id number]  71y/o african [place] female established patient here for right ear pain and vertigo.  Has been taking meclizine once a day and allegra daily.  Thinks she has an ear infection.  PMHx seasonal spring allergies      Review of Systems  Constitutional: Negative for activity change, appetite change, chills, diaphoresis, fatigue, fever and unexpected weight change.  HENT: Positive for ear pain and postnasal drip. Negative for congestion, dental problem, drooling, ear discharge, facial swelling, hearing loss, mouth sores, nosebleeds, rhinorrhea, sinus pressure, sinus pain, sneezing, sore throat, tinnitus, trouble swallowing and voice change.   Eyes: Negative for photophobia, pain, discharge, redness, itching and visual disturbance.  Respiratory: Negative for cough, choking, chest tightness, shortness of breath, wheezing and stridor.   Cardiovascular: Negative for chest pain, palpitations and leg swelling.  Gastrointestinal: Negative for abdominal distention, abdominal pain, blood in stool, constipation, diarrhea, nausea and vomiting.  Endocrine: Negative for cold intolerance and heat intolerance.  Genitourinary: Negative for difficulty urinating, dysuria and hematuria.  Musculoskeletal: Negative for arthralgias, back pain, gait problem, joint swelling, myalgias, neck pain and neck stiffness.  Skin: Negative for color change, pallor, rash and wound.  Allergic/Immunologic: Positive for environmental allergies. Negative for food allergies.  Neurological: Positive for dizziness. Negative for tremors, seizures, syncope, facial asymmetry, speech difficulty, weakness, light-headedness, numbness and headaches.  Hematological: Negative for adenopathy. Does not bruise/bleed easily.  Psychiatric/Behavioral: Negative for agitation, confusion and sleep disturbance.       Objective:   Physical Exam    Constitutional: She is oriented to person, place, and time. She appears well-developed and well-nourished. She is active and cooperative.  Non-toxic appearance. She does not have a sickly appearance. She appears ill. No distress.  HENT:  Head: Normocephalic and atraumatic.  Right Ear: Hearing, external ear and ear canal normal. A middle ear effusion is present.  Left Ear: Hearing, external ear and ear canal normal. A middle ear effusion is present.  Nose: Mucosal edema and rhinorrhea present. No nose lacerations, sinus tenderness, nasal deformity, septal deviation or nasal septal hematoma. No epistaxis.  No foreign bodies. Right sinus exhibits no maxillary sinus tenderness and no frontal sinus tenderness. Left sinus exhibits no maxillary sinus tenderness and no frontal sinus tenderness.  Mouth/Throat: Uvula is midline and mucous membranes are normal. Mucous membranes are not pale, not dry and not cyanotic. She does not have dentures. No oral lesions. No trismus in the jaw. Normal dentition. No dental abscesses, uvula swelling, lacerations or dental caries. Posterior oropharyngeal edema and posterior oropharyngeal erythema present. No oropharyngeal exudate or tonsillar abscesses.  Cobblestoning posterior pharynx; bilateral TMs air fluid level right 75% opacity bulging compared to left only opacity at 6 oclock; bilateral nasal turbinates edema erythema scant clear yellow discharge; bilateral allergic shiners  Eyes: Conjunctivae, EOM and lids are normal. Pupils are equal, round, and reactive to light. Right eye exhibits no chemosis, no discharge, no exudate and no hordeolum. No foreign body present in the right eye. Left eye exhibits no chemosis, no discharge, no exudate and no hordeolum. No foreign body present in the left eye. Right conjunctiva is not injected. Right conjunctiva has no hemorrhage. Left conjunctiva is not injected. Left conjunctiva has no hemorrhage. No scleral icterus. Right eye exhibits  normal extraocular motion and no nystagmus. Left eye exhibits normal extraocular motion and no nystagmus. Right pupil is round and reactive. Left  pupil is round and reactive. Pupils are equal.  Neck: Trachea normal, normal range of motion and phonation normal. Neck supple. No tracheal tenderness and no muscular tenderness present. No neck rigidity. No tracheal deviation, no edema, no erythema and normal range of motion present. No thyroid mass and no thyromegaly present.  Cardiovascular: Normal rate, regular rhythm, S1 normal, S2 normal, normal heart sounds and intact distal pulses. PMI is not displaced. Exam reveals no gallop and no friction rub.  No murmur heard. Pulmonary/Chest: Effort normal and breath sounds normal. No accessory muscle usage or stridor. No respiratory distress. She has no decreased breath sounds. She has no wheezes. She has no rhonchi. She has no rales. She exhibits no tenderness.  Abdominal: Soft. Normal appearance. She exhibits no distension, no fluid wave and no ascites. There is no rigidity and no guarding.  Musculoskeletal: Normal range of motion. She exhibits no edema or tenderness.       Right shoulder: Normal.       Left shoulder: Normal.       Right elbow: Normal.      Left elbow: Normal.       Right hip: Normal.       Left hip: Normal.       Right knee: Normal.       Left knee: Normal.       Cervical back: Normal.       Thoracic back: She exhibits pain.       Lumbar back: Normal.       Back:       Right hand: Normal.       Left hand: Normal.  Right pain under my shoulder blade at rest or with moving  Lymphadenopathy:       Head (right side): No submental, no submandibular, no tonsillar, no preauricular, no posterior auricular and no occipital adenopathy present.       Head (left side): No submental, no submandibular, no tonsillar, no preauricular, no posterior auricular and no occipital adenopathy present.    She has no cervical adenopathy.       Right  cervical: No superficial cervical, no deep cervical and no posterior cervical adenopathy present.      Left cervical: No superficial cervical, no deep cervical and no posterior cervical adenopathy present.  Neurological: She is alert and oriented to person, place, and time. She has normal strength. She is not disoriented. She displays no atrophy and no tremor. No cranial nerve deficit or sensory deficit. She exhibits normal muscle tone. She displays no seizure activity. Coordination and gait normal. GCS eye subscore is 4. GCS verbal subscore is 5. GCS motor subscore is 6.  In/out of chair without difficulty; slight dizziness requiring assist onto exam table; gait sure and steady on discharge in hallway  Skin: Skin is warm, dry and intact. No abrasion, no bruising, no burn, no ecchymosis, no laceration, no lesion, no petechiae and no rash noted. She is not diaphoretic. No cyanosis or erythema. No pallor. Nails show no clubbing.  Psychiatric: She has a normal mood and affect. Her speech is normal and behavior is normal. Judgment and thought content normal. Cognition and memory are normal.  Vitals reviewed.         Assessment & Plan:  A-eustachian tube dysfunction, vertigo, seasonal allergic rhinitis  P-Patient may use normal saline nasal spray 2 sprays each nostril q2h wa as needed. flonase 13mcg 1 spray each nostril BID OTC.  Patient denied personal or family history of ENT  cancer.  OTC antihistamine of choice allegra 180mg  po daily.  Avoid triggers if possible.  Shower prior to bedtime if exposed to triggers.  If allergic dust/dust mites recommend mattress/pillow covers/encasements; washing linens, vacuuming, sweeping, dusting weekly.  Call or return to clinic as needed if these symptoms worsen or fail to improve as anticipated.   Exitcare handout on allergic rhinitis and sinus rinse given to patient.  Patient verbalized understanding of instructions, agreed with plan of care and had no further  questions at this time.  P2:  Avoidance and hand washing.  Discussed fluid starting to get cloudy could turn into an ear infectionSupportive treatment.   No evidence of invasive bacterial infection, non toxic and well hydrated.  This is most likely self limiting viral infection.  I do not see where any further testing or imaging is necessary at this time.   I will suggest supportive care, rest, good hygiene and encourage the patient to take adequate fluids.  The patient is to return to clinic or EMERGENCY ROOM if symptoms worsen or change significantly e.g. ear pain, fever, purulent discharge from ears or bleeding.  Exitcare handout on otitis media and eustachian tube dysfunction given to patient.  Patient verbalized agreement and understanding of treatment plan and had no further questions at this time.    Discussed with patient otitis media with effusion probably causing vertigo but could also be age. Supportive treatment may take up to 8 doses meclizine 12.5mg  per day max 100mg  per 24 hours.  Discussed signs/symptoms stroke. recommended not driving during vertigo episodes.  Follow up if aphasia, dysphasia, visual changes, weakness, fall, worst headache of life, incoordination, fever, ear discharge.  Consider ENT evaluation/follow up with PCM if worsening symptoms not controlled with meclizine or needs Rx refill.  Patient verbalized understanding of information/agreed with plan of care and had no further questions at this time.

## 2017-06-01 ENCOUNTER — Encounter: Payer: Self-pay | Admitting: Adult Health

## 2017-06-01 ENCOUNTER — Ambulatory Visit: Payer: Self-pay | Admitting: Adult Health

## 2017-06-01 VITALS — BP 137/82 | HR 63 | Temp 98.0°F | Resp 16 | Ht 62.0 in | Wt 203.0 lb

## 2017-06-01 DIAGNOSIS — N898 Other specified noninflammatory disorders of vagina: Secondary | ICD-10-CM

## 2017-06-01 DIAGNOSIS — B372 Candidiasis of skin and nail: Secondary | ICD-10-CM

## 2017-06-01 DIAGNOSIS — R399 Unspecified symptoms and signs involving the genitourinary system: Secondary | ICD-10-CM

## 2017-06-01 LAB — POCT URINALYSIS DIPSTICK
APPEARANCE: NORMAL
Bilirubin, UA: NEGATIVE
Glucose, UA: NEGATIVE
Ketones, UA: NEGATIVE
LEUKOCYTES UA: NEGATIVE
Nitrite, UA: NEGATIVE
PH UA: 8 (ref 5.0–8.0)
PROTEIN UA: NEGATIVE
Spec Grav, UA: 1.025 (ref 1.010–1.025)
UROBILINOGEN UA: 0.2 U/dL

## 2017-06-01 MED ORDER — NYSTATIN 100000 UNIT/GM EX CREA: 1.0000 "application " | TOPICAL_CREAM | Freq: Two times a day (BID) | CUTANEOUS | 1 refills | Status: AC

## 2017-06-01 MED ORDER — SULFAMETHOXAZOLE-TRIMETHOPRIM 800-160 MG PO TABS: 1.0000 | ORAL_TABLET | Freq: Two times a day (BID) | ORAL | 0 refills | Status: DC

## 2017-06-01 NOTE — Patient Instructions (Signed)
Return to office for a repeat urine to be sure blood has resolved 5 days after finishing antibiotic. Call for appointment.   Hematuria, Adult Hematuria is blood in your urine. It can be caused by a bladder infection, kidney infection, prostate infection, kidney stone, or cancer of your urinary tract. Infections can usually be treated with medicine, and a kidney stone usually will pass through your urine. If neither of these is the cause of your hematuria, further workup to find out the reason may be needed. It is very important that you tell your health care provider about any blood you see in your urine, even if the blood stops without treatment or happens without causing pain. Blood in your urine that happens and then stops and then happens again can be a symptom of a very serious condition. Also, pain is not a symptom in the initial stages of many urinary cancers. Follow these instructions at home:  Drink lots of fluid, 3-4 quarts a day. If you have been diagnosed with an infection, cranberry juice is especially recommended, in addition to large amounts of water.  Avoid caffeine, tea, and carbonated beverages because they tend to irritate the bladder.  Avoid alcohol because it may irritate the prostate.  Take all medicines as directed by your health care provider.  If you were prescribed an antibiotic medicine, finish it all even if you start to feel better.  If you have been diagnosed with a kidney stone, follow your health care provider's instructions regarding straining your urine to catch the stone.  Empty your bladder often. Avoid holding urine for long periods of time.  After a bowel movement, women should cleanse front to back. Use each tissue only once.  Empty your bladder before and after sexual intercourse if you are a female. Contact a health care provider if:  You develop back pain.  You have a fever.  You have a feeling of sickness in your stomach (nausea) or  vomiting.  Your symptoms are not better in 3 days. Return sooner if you are getting worse. Get help right away if:  You develop severe vomiting and are unable to keep the medicine down.  You develop severe back or abdominal pain despite taking your medicines.  You begin passing a large amount of blood or clots in your urine.  You feel extremely weak or faint, or you pass out. This information is not intended to replace advice given to you by your health care provider. Make sure you discuss any questions you have with your health care provider. Document Released: 01/04/2005 Document Revised: 06/12/2015 Document Reviewed: 09/04/2012 Elsevier Interactive Patient Education  2017 [employer] Vaginal Yeast infection, Adult Vaginal yeast infection is a condition that causes soreness, swelling, and redness (inflammation) of the vagina. It also causes vaginal discharge. This is a common condition. Some women get this infection frequently. What are the causes? This condition is caused by a change in the normal balance of the yeast (candida) and bacteria that live in the vagina. This change causes an overgrowth of yeast, which causes the inflammation. What increases the risk? This condition is more likely to develop in:  Women who take antibiotic medicines.  Women who have diabetes.  Women who take birth control pills.  Women who are pregnant.  Women who douche often.  Women who have a weak defense (immune) system.  Women who have been taking steroid medicines for a long time.  Women who frequently wear tight clothing.  What are the  signs or symptoms? Symptoms of this condition include:  White, thick vaginal discharge.  Swelling, itching, redness, and irritation of the vagina. The lips of the vagina (vulva) may be affected as well.  Pain or a burning feeling while urinating.  Pain during sex.  How is this diagnosed? This condition is diagnosed with a medical history and  physical exam. This will include a pelvic exam. Your health care provider will examine a sample of your vaginal discharge under a microscope. Your health care provider may send this sample for testing to confirm the diagnosis. How is this treated? This condition is treated with medicine. Medicines may be over-the-counter or prescription. You may be told to use one or more of the following:  Medicine that is taken orally.  Medicine that is applied as a cream.  Medicine that is inserted directly into the vagina (suppository).  Follow these instructions at home:  Take or apply over-the-counter and prescription medicines only as told by your health care provider.  Do not have sex until your health care provider has approved. Tell your sex partner that you have a yeast infection. That person should go to his or her health care provider if he or she develops symptoms.  Do not wear tight clothes, such as pantyhose or tight pants.  Avoid using tampons until your health care provider approves.  Eat more yogurt. This may help to keep your yeast infection from returning.  Try taking a sitz bath to help with discomfort. This is a warm water bath that is taken while you are sitting down. The water should only come up to your hips and should cover your buttocks. Do this 3-4 times per day or as told by your health care provider.  Do not douche.  Wear breathable, cotton underwear.  If you have diabetes, keep your blood sugar levels under control. Contact a health care provider if:  You have a fever.  Your symptoms go away and then return.  Your symptoms do not get better with treatment.  Your symptoms get worse.  You have new symptoms.  You develop blisters in or around your vagina.  You have blood coming from your vagina and it is not your menstrual period.  You develop pain in your abdomen. This information is not intended to replace advice given to you by your health care provider.  Make sure you discuss any questions you have with your health care provider. Document Released: 10/14/2004 Document Revised: 06/18/2015 Document Reviewed: 07/08/2014 Elsevier Interactive Patient Education  2018 [employer]. Urinary Tract Infection, Adult A urinary tract infection (UTI) is an infection of any part of the urinary tract. The urinary tract includes the:  Kidneys.  Ureters.  Bladder.  Urethra.  These organs make, store, and get rid of pee (urine) in the body. Follow these instructions at home:  Take over-the-counter and prescription medicines only as told by your doctor.  If you were prescribed an antibiotic medicine, take it as told by your doctor. Do not stop taking the antibiotic even if you start to feel better.  Avoid the following drinks: ? Alcohol. ? Caffeine. ? Tea. ? Carbonated drinks.  Drink enough fluid to keep your pee clear or pale yellow.  Keep all follow-up visits as told by your doctor. This is important.  Make sure to: ? Empty your bladder often and completely. Do not to hold pee for long periods of time. ? Empty your bladder before and after sex. ? Wipe from front to back  after a bowel movement if you are female. Use each tissue one time when you wipe. Contact a doctor if:  You have back pain.  You have a fever.  You feel sick to your stomach (nauseous).  You throw up (vomit).  Your symptoms do not get better after 3 days.  Your symptoms go away and then come back. Get help right away if:  You have very bad back pain.  You have very bad lower belly (abdominal) pain.  You are throwing up and cannot keep down any medicines or water. This information is not intended to replace advice given to you by your health care provider. Make sure you discuss any questions you have with your health care provider. Document Released: 06/23/2007 Document Revised: 06/12/2015 Document Reviewed: 11/25/2014 Elsevier Interactive Patient Education   [employer].

## 2017-06-01 NOTE — Progress Notes (Addendum)
Subjective:     Patient ID: Jaclyn Hall, female   DOB: May 15, 1945, 72 y.o.   MRN: [id number]  HPI  Patient is a 72 year old female in no acute distress she reports she was treated for  H pylor in April 2019. She is feeling much better now.   She comes in today for burning with urination and urgency  x 5 days- vaginal irritation and inner thigh rash and itching. She has been on Clarithromycin and Amoxicillin for H Pylori and was also on Augmentin in February.   She reports vaginal itching and rash inner thighs present x 2 weeks.  She denies any repeat urinary tract infection.   Patient  denies any fever, body aches,chills, rash, chest pain, shortness of breath, nausea, vomiting, or diarrhea.    Review of Systems  Constitutional: Negative.   HENT: Negative.   Eyes: Negative.   Respiratory: Negative.   Cardiovascular: Negative.   Endocrine: Negative.   Genitourinary: Positive for dysuria, hematuria (by POCT none seen in toilet per patient- denies history of . ) and urgency. Negative for decreased urine volume, difficulty urinating, dyspareunia, enuresis, flank pain, frequency, genital sores, menstrual problem, pelvic pain, vaginal bleeding, vaginal discharge and vaginal pain.  Musculoskeletal: Negative.   Skin: Negative.   Neurological: Negative.   Hematological: Negative.   Psychiatric/Behavioral: Negative.        Objective:   Physical Exam  Constitutional: She is oriented to person, place, and time. She appears well-developed and well-nourished.  HENT:  Head: Normocephalic and atraumatic.  Eyes: Pupils are equal, round, and reactive to light. Conjunctivae and EOM are normal.  Neck: Normal range of motion. Neck supple.  Cardiovascular: Normal rate, regular rhythm, normal heart sounds and intact distal pulses. Exam reveals no gallop and no friction rub.  No murmur heard. Pulmonary/Chest: Effort normal and breath sounds normal. No stridor. No respiratory distress. She has no  wheezes. She has no rales. She exhibits no tenderness.  Abdominal: Soft. Bowel sounds are normal.  Genitourinary:  Genitourinary Comments: No gynecology exams done in this office currently and no equipment available. Patient is aware she will have to see gynecology if needed for any pelvic/vaginal exam and will follow up with gynecology/obgyn as needed. Yearly gynecology pelvic exam recommended. Patient verbalized understanding of instructions and denies any further questions at this time.    Musculoskeletal: Normal range of motion.  Neurological: She is alert and oriented to person, place, and time.  Skin: Skin is warm and dry. Capillary refill takes less than 2 seconds. Rash noted. No cyanosis. Nails show no clubbing.     Bilateral inner thighs with darker moist skin with flat mild erythema  bilateral inner thighs and groin folds   Psychiatric: She has a normal mood and affect. Her behavior is normal. Judgment and thought content normal.  Vitals reviewed.      Assessment:     Urinary symptom or sign - Plan: POCT urinalysis dipstick, Urine Culture  Itching in the vaginal area  Yeast infection of the skin     Results for orders placed or performed in visit on 06/01/17 (from the past 24 hour(s))  POCT urinalysis dipstick     Status: Normal   Collection Time: 06/01/17  3:21 PM  Result Value Ref Range   Color, UA yellow    Clarity, UA clear    Glucose, UA negative    Bilirubin, UA negative    Ketones, UA negative    Spec Grav, UA 1.025 1.010 -  1.025   Blood, UA moderate    pH, UA 8.0 5.0 - 8.0   Protein, UA negative    Urobilinogen, UA 0.2 0.2 or 1.0 E.U./dL   Nitrite, UA negative    Leukocytes, UA Negative Negative   Appearance normal    Odor none       Plan:     Meds ordered this encounter  Medications  . sulfamethoxazole-trimethoprim (BACTRIM DS,SEPTRA DS) 800-160 MG tablet    Sig: Take 1 tablet by mouth 2 (two) times daily.    Dispense:  14 tablet    Refill:  0   . nystatin cream (MYCOSTATIN)    Sig: Apply 1 application topically 2 (two) times daily.    Dispense:  30 g    Refill:  1    Apply Nystatin to outer vaginal area and inner thighs/ groin folds. Discussed eating yogurt with probiotics, keeping skin clean and dry.   Discussed ways  to prevent yeast infection. Also advised having recheck on urine POCT and follow up appointment after 5 days of completing Bactrim.   Advised patient call the office or your primary care doctor for an appointment if no improvement within 72 hours or if any symptoms change or worsen at any time  Advised ER or urgent Care if after hours or on weekend. Call 911 for emergency symptoms at any time.Patinet verbalized understanding of all instructions given/reviewed and treatment plan and has no further questions or concerns at this time.    Patient verbalized understanding of all instructions given and denies any further questions at this time.

## 2017-06-03 LAB — URINE CULTURE

## 2017-06-03 NOTE — Progress Notes (Signed)
No significant bacterial growth - keeping with office visit plan and follow up.

## 2017-06-23 ENCOUNTER — Ambulatory Visit: Payer: Self-pay

## 2017-06-24 ENCOUNTER — Encounter: Payer: Self-pay | Admitting: Adult Health

## 2017-06-24 ENCOUNTER — Ambulatory Visit: Payer: Self-pay | Admitting: Adult Health

## 2017-06-24 VITALS — BP 150/82 | HR 73 | Temp 98.1°F | Resp 16 | Wt 206.2 lb

## 2017-06-24 DIAGNOSIS — Z09 Encounter for follow-up examination after completed treatment for conditions other than malignant neoplasm: Secondary | ICD-10-CM

## 2017-06-24 DIAGNOSIS — R319 Hematuria, unspecified: Secondary | ICD-10-CM

## 2017-06-24 LAB — POCT URINALYSIS DIPSTICK
Bilirubin, UA: NEGATIVE
GLUCOSE UA: NEGATIVE
Ketones, UA: NEGATIVE
LEUKOCYTES UA: NEGATIVE
Nitrite, UA: 0.2
Protein, UA: NEGATIVE
Spec Grav, UA: 1.01 (ref 1.010–1.025)
Urobilinogen, UA: NEGATIVE E.U./dL — AB
pH, UA: 6.5 (ref 5.0–8.0)

## 2017-06-24 NOTE — Patient Instructions (Signed)

## 2017-06-24 NOTE — Progress Notes (Signed)
Subjective:     Patient ID: Jaclyn Hall, female   DOB: February 26, 1945, 72 y.o.   MRN: [id number]  HPI   Blood pressure (!) 150/82, pulse 73, temperature 98.1 F (36.7 C), temperature source Tympanic, resp. rate 16, weight 206 lb 3.2 oz (93.5 kg), SpO2 100 %.  Patient is a 72 year old female in no acute distress who comes to the clinic for follow up urine dipstick and exam after treatment 06/01/17 with Bactrim x 7 days.She also was given Nystatin cream for groin/inner thigh yeast rash. She reports she has no symptoms now and rash inner thighs and groin has resolved.   She finished all of her Bactrim and used Nystatin cream as directed.  She denies any symptoms currently.   She denies any vaginal or rectal bleeding. Denies seeing any blood in urine.  Patient  denies any fever, body aches,chills, rash, chest pain, shortness of breath, nausea, vomiting, or diarrhea.   Today she has no leukocytes in urine dipstick but does have trace hematuria on dipstick. Provider discussed this with patient and recommend urology consult. Patient declines and says she has had this before in past and will see her primary care MD for full physical the first of July and she will discuss with him and have him recheck her urine at that time. Provider completely discussed differential diagnosis not limited to but including possible malignancy. Patient verbalized understanding and still declines referral at this time. She is aware she can call the office at anytime to have this referral placed.    No LMP recorded. Patient has had a hysterectomy.    Review of Systems  Constitutional: Negative.   HENT: Negative.   Eyes: Negative.   Respiratory: Negative.   Cardiovascular: Negative.   Gastrointestinal: Negative.   Endocrine: Negative.   Genitourinary: Negative.   Musculoskeletal: Negative.   Skin: Negative.   Allergic/Immunologic: Negative.   Neurological: Negative.   Hematological: Negative.    Psychiatric/Behavioral: Negative.        Objective:   Physical Exam  Constitutional: She is oriented to person, place, and time. She appears well-developed and well-nourished. No distress.  Patient is alert and oriented and responsive to questions Engages in eye contact with provider. Speaks in full sentences without any pauses without any shortness of breath or distress.    HENT:  Head: Normocephalic and atraumatic.  Mouth/Throat: No oropharyngeal exudate.  Eyes: Pupils are equal, round, and reactive to light. Conjunctivae and EOM are normal.  Neck: Normal range of motion. Neck supple.  Cardiovascular: Normal rate, regular rhythm, normal heart sounds and intact distal pulses. Exam reveals no gallop and no friction rub.  No murmur heard. Pulmonary/Chest: Effort normal and breath sounds normal. No stridor. No respiratory distress. She has no wheezes. She has no rales. She exhibits no tenderness.  Abdominal: Soft. Bowel sounds are normal. She exhibits no distension and no mass. There is no tenderness. There is no rebound and no guarding. No hernia.  Genitourinary:  Genitourinary Comments: No gynecology exams done in this office currently and no equipment available. Patient is aware she will have to see gynecology if needed for any pelvic/vaginal exam and will follow up with gynecology/obgyn as needed. Yearly gynecology pelvic exam recommended. Patient verbalized understanding of instructions and denies any further questions at this time.    Musculoskeletal: Normal range of motion.  Patient moves on and off of exam table and in room without difficulty. Gait is normal in hall and in room. Patient is  oriented to person place time and situation. Patient answers questions appropriately and engages in conversation.   Neurological: She is alert and oriented to person, place, and time.  Patient moves on and off of exam table and in room without difficulty. Gait is normal in hall and in room. Patient  is oriented to person place time and situation. Patient answers questions appropriately and engages in conversation.   Skin: Skin is warm and dry. Capillary refill takes less than 2 seconds. She is not diaphoretic.  Psychiatric: She has a normal mood and affect. Her behavior is normal. Judgment and thought content normal.  Vitals reviewed.      Assessment:     Follow up - Plan: POCT urinalysis dipstick  Results for orders placed or performed in visit on 06/24/17 (from the past 24 hour(s))  POCT urinalysis dipstick     Status: Abnormal   Collection Time: 06/24/17  8:09 AM  Result Value Ref Range   Color, UA yellow    Clarity, UA clear    Glucose, UA Negative Negative   Bilirubin, UA neg    Ketones, UA neg    Spec Grav, UA 1.010 1.010 - 1.025   Blood, UA trace    pH, UA 6.5 5.0 - 8.0   Protein, UA Negative Negative   Urobilinogen, UA negative (A) 0.2 or 1.0 E.U./dL   Nitrite, UA 0.2    Leukocytes, UA Negative Negative   Appearance     Odor        Plan:      Provider recommends referral to urology - patient declines despite education- risks versus benefits. She will call if she decides she would like referral to urology.   Keep July 2019 physical appointment for physical and also discuss hematuria - blood in urine with primary care for further evaluation and work up.   Advised patient call the office or your primary care doctor for an appointment if no improvement within 72 hours or if any symptoms change or worsen at any time  Advised ER or urgent Care if after hours or on weekend. Call 911 for emergency symptoms at any time.Patinet verbalized understanding of all instructions given/reviewed and treatment plan and has no further questions or concerns at this time.    Patient verbalized understanding of all instructions given and denies any further questions at this time.

## 2017-07-18 ENCOUNTER — Ambulatory Visit: Payer: Self-pay | Admitting: Medical

## 2017-07-18 ENCOUNTER — Encounter: Payer: Self-pay | Admitting: Medical

## 2017-07-18 VITALS — BP 170/89 | HR 79 | Temp 98.2°F | Resp 18 | Wt 200.2 lb

## 2017-07-18 DIAGNOSIS — H6983 Other specified disorders of Eustachian tube, bilateral: Secondary | ICD-10-CM

## 2017-07-18 DIAGNOSIS — R42 Dizziness and giddiness: Secondary | ICD-10-CM

## 2017-07-18 DIAGNOSIS — H6993 Unspecified Eustachian tube disorder, bilateral: Secondary | ICD-10-CM

## 2017-07-18 DIAGNOSIS — J302 Other seasonal allergic rhinitis: Secondary | ICD-10-CM

## 2017-07-18 MED ORDER — FLUTICASONE PROPIONATE 50 MCG/ACT NA SUSP: 2.0000 | Freq: Every day | NASAL | 6 refills | Status: AC

## 2017-07-18 MED ORDER — MECLIZINE HCL 12.5 MG PO TABS: 12.5000 mg | ORAL_TABLET | Freq: Three times a day (TID) | ORAL | 0 refills | Status: DC

## 2017-07-18 NOTE — Patient Instructions (Signed)
Allergic Rhinitis, Adult Allergic rhinitis is an allergic reaction that affects the mucous membrane inside the nose. It causes sneezing, a runny or stuffy nose, and the feeling of mucus going down the back of the throat (postnasal drip). Allergic rhinitis can be mild to severe. There are two types of allergic rhinitis:  Seasonal. This type is also called hay fever. It happens only during certain seasons.  Perennial. This type can happen at any time of the year.  What are the causes? This condition happens when the body's defense system (immune system) responds to certain harmless substances called allergens as though they were germs.  Seasonal allergic rhinitis is triggered by pollen, which can come from grasses, trees, and weeds. Perennial allergic rhinitis may be caused by:  House dust mites.  Pet dander.  Mold spores.  What are the signs or symptoms? Symptoms of this condition include:  Sneezing.  Runny or stuffy nose (nasal congestion).  Postnasal drip.  Itchy nose.  Tearing of the eyes.  Trouble sleeping.  Daytime sleepiness.  How is this diagnosed? This condition may be diagnosed based on:  Your medical history.  A physical exam.  Tests to check for related conditions, such as: ? Asthma. ? Pink eye. ? Ear infection. ? Upper respiratory infection.  Tests to find out which allergens trigger your symptoms. These may include skin or blood tests.  How is this treated? There is no cure for this condition, but treatment can help control symptoms. Treatment may include:  Taking medicines that block allergy symptoms, such as antihistamines. Medicine may be given as a shot, nasal spray, or pill.  Avoiding the allergen.  Desensitization. This treatment involves getting ongoing shots until your body becomes less sensitive to the allergen. This treatment may be done if other treatments do not help.  If taking medicine and avoiding the allergen does not work, new,  stronger medicines may be prescribed.  Follow these instructions at home:  Find out what you are allergic to. Common allergens include smoke, dust, and pollen.  Avoid the things you are allergic to. These are some things you can do to help avoid allergens: ? Replace carpet with wood, tile, or vinyl flooring. Carpet can trap dander and dust. ? Do not smoke. Do not allow smoking in your home. ? Change your heating and air conditioning filter at least once a month. ? During allergy season:  Keep windows closed as much as possible.  Plan outdoor activities when pollen counts are lowest. This is usually during the evening hours.  When coming indoors, change clothing and shower before sitting on furniture or bedding.  Take over-the-counter and prescription medicines only as told by your health care provider.  Keep all follow-up visits as told by your health care provider. This is important. Contact a health care provider if:  You have a fever.  You develop a persistent cough.  You make whistling sounds when you breathe (you wheeze).  Your symptoms interfere with your normal daily activities. Get help right away if:  You have shortness of breath. Summary  This condition can be managed by taking medicines as directed and avoiding allergens.  Contact your health care provider if you develop a persistent cough or fever.  During allergy season, keep windows closed as much as possible. This information is not intended to replace advice given to you by your health care provider. Make sure you discuss any questions you have with your health care provider. Document Released: 09/29/2000 Document Revised: 02/12/2016  Document Reviewed: 02/12/2016 Elsevier Interactive Patient Education  2018 [place]. Eustachian Tube Dysfunction The eustachian tube connects the middle ear to the back of the nose. It regulates air pressure in the middle ear by allowing air to move between the ear and  nose. It also helps to drain fluid from the middle ear space. When the eustachian tube does not function properly, air pressure, fluid, or both can build up in the middle ear. Eustachian tube dysfunction can affect one or both ears. What are the causes? This condition happens when the eustachian tube becomes blocked or cannot open normally. This may result from:  Ear infections.  Colds and other upper respiratory infections.  Allergies.  Irritation, such as from cigarette smoke or acid from the stomach coming up into the esophagus (gastroesophageal reflux).  Sudden changes in air pressure, such as from descending in an airplane.  Abnormal growths in the nose or throat, such as nasal polyps, tumors, or enlarged tissue at the back of the throat (adenoids).  What increases the risk? This condition may be more likely to develop in people who smoke and people who are overweight. Eustachian tube dysfunction may also be more likely to develop in children, especially children who have:  Certain birth defects of the mouth, such as cleft palate.  Large tonsils and adenoids.  What are the signs or symptoms? Symptoms of this condition may include:  A feeling of fullness in the ear.  Ear pain.  Clicking or popping noises in the ear.  Ringing in the ear.  Hearing loss.  Loss of balance.  Symptoms may get worse when the air pressure around you changes, such as when you travel to an area of high elevation or fly on an airplane. How is this diagnosed? This condition may be diagnosed based on:  Your symptoms.  A physical exam of your ear, nose, and throat.  Tests, such as those that measure: ? The movement of your eardrum (tympanogram). ? Your hearing (audiometry).  How is this treated? Treatment depends on the cause and severity of your condition. If your symptoms are mild, you may be able to relieve your symptoms by moving air into ("popping") your ears. If you have symptoms of  fluid in your ears, treatment may include:  Decongestants.  Antihistamines.  Nasal sprays or ear drops that contain medicines that reduce swelling (steroids).  In some cases, you may need to have a procedure to drain the fluid in your eardrum (myringotomy). In this procedure, a small tube is placed in the eardrum to:  Drain the fluid.  Restore the air in the middle ear space.  Follow these instructions at home:  Take over-the-counter and prescription medicines only as told by your health care provider.  Use techniques to help pop your ears as recommended by your health care provider. These may include: ? Chewing gum. ? Yawning. ? Frequent, forceful swallowing. ? Closing your mouth, holding your nose closed, and gently blowing as if you are trying to blow air out of your nose.  Do not do any of the following until your health care provider approves: ? Travel to high altitudes. ? Fly in airplanes. ? Work in a Pension scheme manager or room. ? Scuba dive.  Keep your ears dry. Dry your ears completely after showering or bathing.  Do not smoke.  Keep all follow-up visits as told by your health care provider. This is important. Contact a health care provider if:  Your symptoms do not go away  after treatment.  Your symptoms come back after treatment.  You are unable to pop your ears.  You have: ? A fever. ? Pain in your ear. ? Pain in your head or neck. ? Fluid draining from your ear.  Your hearing suddenly changes.  You become very dizzy.  You lose your balance. This information is not intended to replace advice given to you by your health care provider. Make sure you discuss any questions you have with your health care provider. Document Released: 01/31/2015 Document Revised: 06/12/2015 Document Reviewed: 01/23/2014 Elsevier Interactive Patient Education  2018 [employer]. Vertigo Vertigo means that you feel like you are moving when you are not. Vertigo can also make  you feel like things around you are moving when they are not. This feeling can come and go at any time. Vertigo often goes away on its own. Follow these instructions at home:  Avoid making fast movements.  Avoid driving.  Avoid using heavy machinery.  Avoid doing any task or activity that might cause danger to you or other people if you would have a vertigo attack while you are doing it.  Sit down right away if you feel dizzy or have trouble with your balance.  Take over-the-counter and prescription medicines only as told by your doctor.  Follow instructions from your doctor about which positions or movements you should avoid.  Drink enough fluid to keep your pee (urine) clear or pale yellow.  Keep all follow-up visits as told by your doctor. This is important. Contact a doctor if:  Medicine does not help your vertigo.  You have a fever.  Your problems get worse or you have new symptoms.  Your family or friends see changes in your behavior.  You feel sick to your stomach (nauseous) or you throw up (vomit).  You have a "pins and needles" feeling or you are numb in part of your body. Get help right away if:  You have trouble moving or talking.  You are always dizzy.  You pass out (faint).  You get very bad headaches.  You feel weak or have trouble using your hands, arms, or legs.  You have changes in your hearing.  You have changes in your seeing (vision).  You get a stiff neck.  Bright light starts to bother you. This information is not intended to replace advice given to you by your health care provider. Make sure you discuss any questions you have with your health care provider. Document Released: 10/14/2007 Document Revised: 06/12/2015 Document Reviewed: 04/29/2014 Elsevier Interactive Patient Education  [employer].

## 2017-07-18 NOTE — Progress Notes (Signed)
Subjective:    Patient ID: Jaclyn Hall, female    DOB: Nov 27, 1945, 72 y.o.   MRN: [id number]  HPI Started in the afternoon when she woke up on Tuesday. Took her Meclizine with 2-3 doses. Then she ran out of medication and husband went and got Dramamine which made her symptoms worse. She felt the Meclizine was working.  History of Vertigo. Patient says this feels like her Vertigo. Did not  On 3rd shift takes medication at night. But when she is off she take blood pressure medication in the morning.    Blood pressure (!) 170/89, pulse 79, temperature 98.2 F (36.8 C), temperature source Tympanic, resp. rate 18, weight 200 lb 3.2 oz (90.8 kg), SpO2 99 %.  Review of Systems  Constitutional: Negative for chills and fever.  HENT: Positive for ear pain (right started 2 weeks ago, eats and hears a cracking sound while chewing,, ear felt sensitive treated with sweet oil.) and sinus pressure (around eyes and bridge nose.). Negative for congestion, facial swelling, hearing loss and sore throat.   Eyes: Negative for photophobia and visual disturbance.  Respiratory: Positive for chest tightness and wheezing (2 weeks ago had "Bronchitis" did not see a doctor , "it did not last long"). Negative for cough and shortness of breath.   Cardiovascular: Positive for leg swelling (swollen legs  2 weeks ago "it is good now"). Negative for chest pain and palpitations.  Gastrointestinal: Negative for abdominal pain and blood in stool.  Endocrine: Positive for polyuria ("yes I take a fluid pill"). Negative for polydipsia and polyphagia.  Genitourinary: Negative for dysuria.  Musculoskeletal: Positive for neck stiffness (with the dizziness, room spining and loss of balance , "I was bumping into stuff"). Negative for back pain.  Skin: Negative for rash.  Neurological: Positive for dizziness, weakness (inintially with shaking and was hot, similar to episodes of Vertigo) and headaches. Negative for tremors,  seizures, syncope, speech difficulty, light-headedness and numbness.  Hematological: Negative for adenopathy.  Psychiatric/Behavioral: Negative for behavioral problems, confusion, hallucinations, self-injury and suicidal ideas.   Has a back of headache. Taking Claritin not Allergra No headache now Has had to have the crystals in ears realigned.    Objective:   Physical Exam  Constitutional: She appears well-developed and well-nourished.  HENT:  Head: Normocephalic and atraumatic.  Right Ear: External ear normal. A middle ear effusion is present.  Left Ear: External ear normal. A middle ear effusion is present.  Nose: Mucosal edema (right side) present.  Mouth/Throat: Uvula is midline, oropharynx is clear and moist and mucous membranes are normal. Tonsils are 0 on the right. Tonsils are 0 on the left.  Eyes: Pupils are equal, round, and reactive to light. Conjunctivae and EOM are normal.  Neck: Normal range of motion. Neck supple.  Cardiovascular: Normal rate, regular rhythm and normal heart sounds.  Pulmonary/Chest: Effort normal and breath sounds normal.  Musculoskeletal: Normal range of motion.  Lymphadenopathy:    She has no cervical adenopathy.       Right: No supraclavicular adenopathy present.       Left: No supraclavicular adenopathy present.  Neurological: She is alert. She has normal strength. No cranial nerve deficit or sensory deficit. She displays a negative Romberg sign. Coordination and gait normal. GCS eye subscore is 4. GCS verbal subscore is 5. GCS motor subscore is 6.  Skin: Skin is warm and dry.  Psychiatric: She has a normal mood and affect. Her behavior is normal. Judgment and thought content  normal.  Nursing note and vitals reviewed.  Negative Rhomberg, finger to nose wnl Easily gets on /off the exam table Gait wnl, steady        Assessment & Plan:  Vertigo, Eustachian tube dysfunction , allergic rhinitis Meds ordered this encounter  Medications  .  meclizine (ANTIVERT) 12.5 MG tablet    Sig: Take 1 tablet (12.5 mg total) by mouth 3 (three) times daily.    Dispense:  30 tablet    Refill:  0  . fluticasone (FLONASE) 50 MCG/ACT nasal spray    Sig: Place 2 sprays into both nostrils daily.    Dispense:  16 g    Refill:  6  She says she will take her blood pressure medication when she gets home. Reviewed with patient the importance of taking her blood pressure medication. Reviewed signs and symptoms of stroke, patient knew most of them. If these are to occur she is to call 911 and go to the Emergency Department Return to the clinic as needed. Patient verbalizes understanding and has no questions at discharge.

## 2017-10-04 ENCOUNTER — Encounter: Payer: Self-pay | Admitting: Emergency Medicine

## 2017-10-04 ENCOUNTER — Emergency Department
Admission: EM | Admit: 2017-10-04 | Discharge: 2017-10-04 | Disposition: A | Discharge status: Other Acute Inpatient Hospital | Payer: BLUE CROSS/BLUE SHIELD | Attending: Emergency Medicine | Admitting: Emergency Medicine

## 2017-10-04 ENCOUNTER — Emergency Department: Payer: BLUE CROSS/BLUE SHIELD

## 2017-10-04 ENCOUNTER — Other Ambulatory Visit: Payer: Self-pay

## 2017-10-04 DIAGNOSIS — Z79899 Other long term (current) drug therapy: Secondary | ICD-10-CM | POA: Diagnosis not present

## 2017-10-04 DIAGNOSIS — Z87891 Personal history of nicotine dependence: Secondary | ICD-10-CM | POA: Insufficient documentation

## 2017-10-04 DIAGNOSIS — J45909 Unspecified asthma, uncomplicated: Secondary | ICD-10-CM | POA: Diagnosis not present

## 2017-10-04 DIAGNOSIS — Z7982 Long term (current) use of aspirin: Secondary | ICD-10-CM | POA: Diagnosis not present

## 2017-10-04 DIAGNOSIS — I10 Essential (primary) hypertension: Secondary | ICD-10-CM | POA: Diagnosis not present

## 2017-10-04 DIAGNOSIS — Z9049 Acquired absence of other specified parts of digestive tract: Secondary | ICD-10-CM | POA: Diagnosis not present

## 2017-10-04 DIAGNOSIS — G9520 Unspecified cord compression: Secondary | ICD-10-CM | POA: Diagnosis not present

## 2017-10-04 DIAGNOSIS — M545 Low back pain: Secondary | ICD-10-CM | POA: Diagnosis present

## 2017-10-04 LAB — BASIC METABOLIC PANEL
ANION GAP: 10 (ref 5–15)
BUN: 13 mg/dL (ref 8–23)
CO2: 29 mmol/L (ref 22–32)
Calcium: 9.7 mg/dL (ref 8.9–10.3)
Chloride: 100 mmol/L (ref 98–111)
Creatinine, Ser: 0.87 mg/dL (ref 0.44–1.00)
GFR calc Af Amer: >60 mL/min (ref >60)
GLUCOSE: 107 mg/dL — AB (ref 70–99)
POTASSIUM: 3.7 mmol/L (ref 3.5–5.1)
Sodium: 139 mmol/L (ref 135–145)

## 2017-10-04 LAB — CBC
HEMATOCRIT: 39.8 % (ref 35.0–47.0)
HEMOGLOBIN: 14.2 g/dL (ref 12.0–16.0)
MCH: 31.8 pg (ref 26.0–34.0)
MCHC: 35.8 g/dL (ref 32.0–36.0)
MCV: 88.8 fL (ref 80.0–100.0)
Platelets: 312 10*3/uL (ref 150–440)
RBC: 4.48 MIL/uL (ref 3.80–5.20)
RDW: 13 % (ref 11.5–14.5)
WBC: 5.2 10*3/uL (ref 3.6–11.0)

## 2017-10-04 MED ORDER — MIDAZOLAM HCL 5 MG/5ML IJ SOLN
INTRAMUSCULAR | Status: AC
  Filled 2017-10-04: qty 5

## 2017-10-04 MED ORDER — KETOROLAC TROMETHAMINE 60 MG/2ML IM SOLN
30.0000 mg | Freq: Once | INTRAMUSCULAR | Status: AC
  Administered 2017-10-04: 30 mg via INTRAMUSCULAR
  Filled 2017-10-04: qty 2

## 2017-10-04 MED ORDER — MORPHINE SULFATE (PF) 4 MG/ML IV SOLN
4.0000 mg | Freq: Once | INTRAVENOUS | Status: AC
  Administered 2017-10-04: 4 mg via INTRAVENOUS
  Filled 2017-10-04: qty 1

## 2017-10-04 MED ORDER — MIDAZOLAM HCL 2 MG/2ML IJ SOLN: 2.0000 mg | Freq: Once | INTRAMUSCULAR | Status: DC

## 2017-10-04 MED ORDER — KETOROLAC TROMETHAMINE 30 MG/ML IJ SOLN: 30.0000 mg | Freq: Once | INTRAMUSCULAR | Status: DC

## 2017-10-04 NOTE — ED Provider Notes (Signed)
[facility] Emergency Department Provider Note ____________________________________________   First MD Initiated Contact with Patient 10/04/17 1159     (approximate)  I have reviewed the triage vital signs and the nursing notes.   HISTORY  Chief Complaint No chief complaint on file.    HPI Jaclyn Hall is a 72 y.o. female with PMH as noted below who presents with bilateral mid and lower back pain over the last week, acute onset after she states that she pulled a muscle in her back, and associated with tingling and numbness to her lower abdomen and legs.  The patient states that she initially injured herself when jerking backwards to keep from tripping over a dog.  She felt a sudden pain across her mid back.  She reports that the pain has been persistent since then despite taking a muscle relaxant and seeing her doctor twice.  She also reports that over the last day she has developed decreased sensation from her mid abdomen down, and tingling in bilateral lower extremities.  She denies urinary incontinence but does report some numbness and tingling around her pelvic region.  She states that she has had difficulty walking.  Past Medical History:  Diagnosis Date  . Arthritis   . Asthma   . Colon polyps   . Hypertension   . Osteoporosis   . Vertigo     Patient Active Problem List   Diagnosis Date Noted  . Allergic state 03/14/2017  . Arthritis 03/14/2017  . Hypertension 03/14/2017  . Osteoporosis, post-menopausal 03/14/2017  . 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 ABDOMINAL HYSTERECTOMY    . CHOLECYSTECTOMY    . COLONOSCOPY    . COLONOSCOPY N/A 11/22/2014   Procedure: COLONOSCOPY;  Surgeon: Lollie Sails, MD;  Location: [facility] ENDOSCOPY;  Service: Endoscopy;  Laterality: N/A;  . OOPHORECTOMY      Prior to Admission medications   Medication  Sig Start Date End Date Taking? Authorizing Provider  amLODipine (NORVASC) 5 MG tablet Take 5 mg by mouth daily.    [provider]  aspirin 81 MG tablet Take 81 mg by mouth daily.    [provider]  fexofenadine (ALLEGRA) 180 MG tablet Take 180 mg by mouth daily.    [provider]  fluocinonide ointment (LIDEX) 6.96 % Apply 0.5 application topically as needed.    [provider]  fluticasone (FLONASE) 50 MCG/ACT nasal spray Place 2 sprays into both nostrils daily. 07/18/17   Ratcliffe, Heather R, PA-C  hydrOXYzine (ATARAX/VISTARIL) 25 MG tablet Take 25 mg by mouth 3 (three) times daily as needed. Reported on 07/15/2015    [provider]  lisinopril (PRINIVIL,ZESTRIL) 10 MG tablet Take 10 mg by mouth daily. 03/09/17   [provider]  meclizine (ANTIVERT) 12.5 MG tablet Take 1 tablet (12.5 mg total) by mouth 3 (three) times daily. 07/18/17   Ratcliffe, Heather R, PA-C  meloxicam (MOBIC) 15 MG tablet Take 15 mg by mouth daily.    [provider]  Multiple Vitamins-Minerals (MULTIVITAMIN WITH MINERALS) tablet Take 1 tablet by mouth daily.    [provider]  nystatin cream (MYCOSTATIN) Apply 1 application topically 2 (two) times daily. 06/01/17   Flinchum, Kelby Aline, FNP  omega-3 acid ethyl esters (LOVAZA) 1 G capsule Take by mouth daily.    [provider]  omeprazole (PRILOSEC) 20 MG capsule Take 20 mg by mouth daily.  10/04/16 10/04/17  [provider]  triamterene-hydrochlorothiazide (MAXZIDE) 75-50 MG tablet Take 75 tablets by mouth daily. 08/11/15   [provider]  vitamin B-12 (CYANOCOBALAMIN) 1000 MCG tablet Take 1,000 mcg by mouth daily.    [provider]    Allergies Patient has no known allergies.  No family history on file.  Social History Social History   Tobacco Use  . Smoking status: Former Research scientist (life sciences)  . Smokeless tobacco: Never Used  Substance Use Topics  . Alcohol use: No    . Drug use: No    Review of Systems  Constitutional: No fever. Eyes: No redness. ENT: No neck pain. Cardiovascular: Denies chest pain. Respiratory: Denies shortness of breath. Gastrointestinal: No abdominal pain.  Genitourinary: Negative for dysuria.  Musculoskeletal: Positive for back pain. Skin: Negative for rash. Neurological: Negative for headache.  Positive for tingling and numbness to bilateral lower extremities.   ____________________________________________   PHYSICAL EXAM:  VITAL SIGNS: ED Triage Vitals  Enc Vitals Group     BP 10/04/17 1109 (!) 138/58     Pulse Rate 10/04/17 1109 64     Resp 10/04/17 1109 16     Temp 10/04/17 1109 97.7 F (36.5 C)     Temp Source 10/04/17 1109 Oral     SpO2 10/04/17 1109 96 %     Weight 10/04/17 1108 200 lb (90.7 kg)     Height 10/04/17 1108 5\' 4"  (1.626 m)     Head Circumference --      Peak Flow --      Pain Score 10/04/17 1108 8     Pain Loc --      Pain Edu? --      Excl. in [place]? --     Constitutional: Alert and oriented.  Uncomfortable appearing but in no acute distress. Eyes: Conjunctivae are normal.  Head: Atraumatic. Nose: No congestion/rhinnorhea. Mouth/Throat: Mucous membranes are moist.   Neck: Normal range of motion.  No midline cervical spinal tenderness.  Full range of motion. Cardiovascular: Good peripheral circulation. Respiratory: Normal respiratory effort.  Gastrointestinal: Soft and nontender. No distention.  Genitourinary: No flank tenderness. Musculoskeletal: No lower extremity edema.  Extremities warm and well perfused.  No midline spinal tenderness. Neurologic:  Normal speech and language.  4/5 motor strength in bilateral lower extremities.  Subjective decreased sensation to lower abdomen and bilateral lower extremities.  Skin:  Skin is warm and dry. No rash noted. Psychiatric: Mood and affect are normal. Speech and behavior are normal.  ____________________________________________    LABS (all labs ordered are listed, but only abnormal results are displayed)  Labs Reviewed  BASIC METABOLIC PANEL - Abnormal; Notable for the following components:      Result Value   Glucose, Bld 107 (*)    All other components within normal limits  CBC  URINALYSIS, COMPLETE (UACMP) WITH MICROSCOPIC  CBG MONITORING, ED   ____________________________________________  EKG   ____________________________________________  RADIOLOGY  MR thoracic spine: T4-T5 facet area inflammation with swelling and spinal cord compression; no evidence of abscess MR lumbar spine: No acute finding  ____________________________________________   PROCEDURES  Procedure(s) performed: No  Procedures  Critical Care performed: No ____________________________________________   INITIAL IMPRESSION / ASSESSMENT AND PLAN / ED COURSE  Pertinent labs & imaging results that were available during my care of the patient were reviewed by me and considered in my medical decision making (see chart for details).  72 year old female with PMH as noted above presents with primarily thoracic back pain  since an injury last week, with development of bilateral lower abdominal and lower extremity decreased sensation and tingling since yesterday.  On exam, the patient is uncomfortable but not acutely ill-appearing.  Her vital signs are normal.  The remainder of the exam is as described above, with decreased sensation of bilateral lower extremities and some decreased strength as well.  Overall I am concerned for possible spinal involvement especially in the thoracic or upper lumbar region based on the location of her initial pain.  However the fact that it is bilateral makes this somewhat less likely.  Differential also includes sciatica, radiculopathy, or muscle strain.  We will obtain basic labs, give analgesia, and MR of the thoracic and lumbar spine.  There is no evidence of cervical involvement based on the  location of the symptoms and the lack of any neck pain.  ----------------------------------------- 5:19 PM on 10/04/2017 -----------------------------------------  MRI shows inflammatory process around the T4-T5 area with compression on the spinal cord.  This is consistent with the patient's symptoms.  I consulted Dr. Wyline Mood from neurosurgery who evaluated the patient.  He advises that she will need transfer to another facility with higher level neurosurgical care.  He does not recommend any medical intervention at this time.  I have contacted the [facility] transfer center and am waiting to hear from the neurosurgeon.  ----------------------------------------- 5:58 PM on 10/04/2017 -----------------------------------------  Patient has been accepted to direct neurosurgery under Dr. Cari Caraway.  Awaiting a bed.  Her symptoms are stable and her pain is under control at this time. ____________________________________________   FINAL CLINICAL IMPRESSION(S) / ED DIAGNOSES  Final diagnoses:  Spinal cord compression ([place])      NEW MEDICATIONS STARTED DURING THIS VISIT:  New Prescriptions   No medications on file     Note:  This document was prepared using Dragon voice recognition software and may include unintentional dictation errors.    Arta Silence, MD 10/04/17 1758

## 2017-10-04 NOTE — ED Notes (Signed)
Pt taken [facility] in [place] via [facility]. VSS. NAD.

## 2017-10-04 NOTE — Consult Note (Signed)
NEUROSURGERY INPATIENT CONSULT  Jaclyn Hall Oct 01, 1945 [id number] 10/04/2017 0  Date of Service:  10/04/2017  Patient Care Team: [place] Pink, MD as PCP - General (Family Medicine)  Consult Requested by:  Arta Silence, M.D.  Chief Complaint: I can't stand.  History of Present Illness: 72 year old woman presented to emergency room department today complaining of inability to stand.  She reports that approximately a week ago she injured her upper back when trying to avoid stepping on her new puppy.  When she jerked out of position she noted pain behind the right scapula.  This pain persisted but she did not fall.  Yesterday morning when she awoke she noted numbness and tingling involving both lower extremities however more severe on the right.  This morning when she awoke she reports feeling heaviness in both legs as well as a sensation of numbness from the mid abdomen down.  She attempted to stand and walk however is unable to do so without assistance.  No recent fevers, chills, or sweats.  Past Medical History  Patient Active Problem List   Diagnosis Date Noted  . Allergic state 03/14/2017  . Arthritis 03/14/2017  . Hypertension 03/14/2017  . Osteoporosis, post-menopausal 03/14/2017  . Benign paroxysmal positional vertigo due to bilateral vestibular disorder 06/30/2015    Past Medical History:  Diagnosis Date  . Arthritis   . Asthma   . Colon polyps   . Hypertension   . Osteoporosis   . Vertigo      Past Surgical History:  Procedure Laterality Date  . ABDOMINAL HYSTERECTOMY    . CHOLECYSTECTOMY    . COLONOSCOPY    . COLONOSCOPY N/A 11/22/2014   Procedure: COLONOSCOPY;  Surgeon: Lollie Sails, MD;  Location: [facility] ENDOSCOPY;  Service: Endoscopy;  Laterality: N/A;  . OOPHORECTOMY      No Known Allergies   Social History   Tobacco Use  . Smoking status: Former Research scientist (life sciences)  . Smokeless tobacco: Never Used  Substance Use Topics  . Alcohol use: No     No  family history on file.   ROS: General: Not Present- Fever/chills and weight Loss. Skin: Not Present- Bruising and rash. HEENT: Not Present- Mouth sores, change/blurred vision, diplopia, ear infection, hearing loss, tinnitus, vertigo, nasal congestion and recent sore throat. Respiratory: Not Present- Cough and shortness of breath. Cardiovascular: Not Present- Calf pain when walking, chest pain, leg swelling, irregular heart beat.  Gastrointestinal: Not Present- Abdominal pain, blood in stool, diarrhea, nausea and vomiting. Genitourinary: Not Present- Dysuria, hematuria and incontinence. Musculoskeletal: Not Present- Joint pain/swelling and muscle pain. Neurological:  As per HPI Psychiatric: Not Present- Anxiety and depression. Endocrine: Not Present- Excessive thirst, cold/heat intolerance, low blood sugar, hormone replacement therapy, and steroid use. Hematology: Not Present- Excessive bleeding.   EXAMINATION  VITALS: BP (!) 194/58 (BP Location: Left Arm)   Pulse 66   Temp 97.7 F (36.5 C) (Oral)   Resp 16   Ht 5\' 4"  (1.626 m)   Wt 90.7 kg   SpO2 98%   BMI 34.33 kg/m    Vitals Current Average / Min / Max     Temp    97.7 F (36.5 C)    Temp  Min: 97.7 F (36.5 C)  Max: 97.7 F (36.5 C)     BP     (!) 194/58     BP  Min: 138/58  Max: 194/58     HR    66    Pulse  Avg: 65  Min:  15  Max: 66     RR    16    Resp  Avg: 16  Min: 16  Max: 16     Sats    98 %    SpO2  Min: 96 %  Max: 98 %     Weight    90.7 kg    Admit: 90.7 kg    GENERAL: Healthy-appearing, Well-developed, well-nourished.   ABDOMEN: Soft and nontender.    MUSCULOSKELETAL:  Normal bulk and tone of upper and lower extremities.   No abnormal movements or fasiculations.   HIGHER INTEGRATIVE FUNCTIONS:  GCS: 15 Oriented to person, place, and time Recent and remote memory normal Attention and concentration normal Language function: normal naming, repetition and repeating phrases Fund of knowledge  normal.   CRANIAL NERVES: 3rd, 4th, 6th Cranial nerves:  Pupils equal, round, react to light, full extraocular movements. 7th CN:  Facial muscles strong and symmetric. 9th CN:  Palate movement full and symmetric. 11th CN:  No deficit in shoulder shrug. 12th CN:  Tongue protrusion Midline.   STRENGTH:  Full and symmetric strength in upper extremities, including deltoid, biceps, triceps, brachioradialis, grasp, wrist and finger extensors, wrist flexors.   Right lower extremity strength is 4+/5 throughout. Left lower extremity strength is 5/5.   REFLEXES: Normal 2+ biceps triceps brachioradialis; 3+ patellar and 2+ Achilles bilaterally. Babinski: right-mute; left - downgoing No clonus bilaterally No Hoffman's sign bilaterally   SENSATION: RLE reduced to LT  PROPRIOCEPTION: Intact on left Unable to detect "down" on right   Unable to stand without assistance; required full support by examiner Unable to ambulate due to RLE weakness  LABORATORY: Lab Results  Component Value Date   NA 139 10/04/2017   K 3.7 10/04/2017   CL 100 10/04/2017   WBC 5.2 10/04/2017   HGB 14.2 10/04/2017   HCT 39.8 10/04/2017     IMAGING:  I personally reviewed the patient's thoracic and lumbar MRI.  There is a dorsal mass on the right at T4-5 causing anterior displacement and compression of the high thoracic cord.  No cord signal change.  See the radiology report for additional details.  IMPRESSION: 1. Inflammatory posterior epidural process at T4-5, likely related to inflammation of the right T4-5 facet joint. This could be due to infection or noninfectious inflammation. There is resulting mass effect on upper thoracic cord which may contribute to the patient's symptoms. Neuro surgical consultation recommended. 2. No evidence of drainable epidural abscess, discitis or osteomyelitis. 3. No acute findings in the lumbar spine. 4. Mild multifactorial spinal stenosis at L4-5 with severe facet  and ligamentous hypertrophy. 5. These results were called by telephone at the time of interpretation on 10/04/2017 at 3:38 pm to Dr. Arta Silence , who verbally acknowledged these results.   ASSESSMENT AND PLAN:   73 year old woman with acute thoracic myelopathy.  Her symptoms have progressed in a relatively rapid fashion as yesterday she had tingling involving the lower extremities and today is unable to support herself due to weakness of the right lower extremity.  There are currently no long track signs however her reflexes in the patella bilaterally are 3+.  MRI demonstrates a dorsal lesion to the cord at T4-5 that appears inflammatory in nature, displaces the cord anteriorly with mild compression.  There is no evidence of cord signal change.  It is in the best medical interest of the patient to be transferred to a tertiary care center with expertise in treating complex spinal disorders.  The care she requires is currently beyond the scope of [facility].  I have discussed this with the patient, her husband, and Dr. Cherylann Banas.  Arrangements are attempting to be made for transfer to the [place].  DIAGNOSIS: Thoracic myelopathy  This note was dictated using voice recognition software.  Please contact me if there are any questions regarding its content.  Electronically signed by: @MECRED @ 10/04/2017 4:54 PM

## 2017-10-04 NOTE — ED Notes (Signed)
Pt resting in bed , No questions or concerns at this time. Awaiting room assignment. VSS. NAD. Will continue to monitor for changes.

## 2017-10-04 NOTE — ED Notes (Signed)
Pt assisted to the toilet, while trying to make it to the toilet her leg would give out on her several times. She is unable to bend or move her right leg. States that her stomach feels numb, she was unable to urinate or have a BM while on the toilet.

## 2017-10-04 NOTE — ED Notes (Signed)
[facility] called and report given at this time.

## 2017-10-04 NOTE — ED Triage Notes (Signed)
Pt to ED via POV with c/o shoulder blade pain after" pulling a muscle" last week, pt states numbness and tingling/pain to lower extrem since yesterday. Speech clear, denies headache.

## 2017-10-04 NOTE — ED Notes (Signed)
Attempted to call report, gave RN my number to return my call. I called [phone].

## 2017-10-04 NOTE — ED Notes (Signed)
Pt returned to ER room at this time.

## 2017-10-04 NOTE — ED Notes (Signed)
Pt to MRI

## 2017-10-26 ENCOUNTER — Ambulatory Visit: Payer: BLUE CROSS/BLUE SHIELD | Attending: Nurse Practitioner | Admitting: Physical Therapy

## 2017-10-26 ENCOUNTER — Other Ambulatory Visit: Payer: Self-pay

## 2017-10-26 DIAGNOSIS — M6281 Muscle weakness (generalized): Secondary | ICD-10-CM | POA: Diagnosis present

## 2017-10-26 DIAGNOSIS — R262 Difficulty in walking, not elsewhere classified: Secondary | ICD-10-CM | POA: Diagnosis not present

## 2017-10-26 MED ORDER — LORATADINE 10 MG PO TABS
10.00 | ORAL_TABLET | ORAL | Status: DC
Start: 2017-10-26 — End: 2017-10-26

## 2017-10-26 MED ORDER — SIMETHICONE 80 MG PO CHEW
80.00 | CHEWABLE_TABLET | ORAL | Status: DC
Start: 2017-10-25 — End: 2017-10-26

## 2017-10-26 MED ORDER — ONDANSETRON 4 MG PO TBDP
4.00 | ORAL_TABLET | ORAL | Status: DC
Start: ? — End: 2017-10-26

## 2017-10-26 MED ORDER — TRIAMTERENE-HCTZ 75-50 MG PO TABS
0.50 | ORAL_TABLET | ORAL | Status: DC
Start: 2017-10-26 — End: 2017-10-26

## 2017-10-26 MED ORDER — SENNOSIDES-DOCUSATE SODIUM 8.6-50 MG PO TABS
2.00 | ORAL_TABLET | ORAL | Status: DC
Start: 2017-10-25 — End: 2017-10-26

## 2017-10-26 MED ORDER — THERA PO TABS
1.00 | ORAL_TABLET | ORAL | Status: DC
Start: 2017-10-26 — End: 2017-10-26

## 2017-10-26 MED ORDER — ENOXAPARIN SODIUM 40 MG/0.4ML ~~LOC~~ SOLN
40.00 | SUBCUTANEOUS | Status: DC
Start: 2017-10-26 — End: 2017-10-26

## 2017-10-26 MED ORDER — GENERIC EXTERNAL MEDICATION
1.00 | Status: DC
Start: ? — End: 2017-10-26

## 2017-10-26 MED ORDER — MECLIZINE HCL 12.5 MG PO TABS
12.50 | ORAL_TABLET | ORAL | Status: DC
Start: ? — End: 2017-10-26

## 2017-10-26 MED ORDER — GENERIC EXTERNAL MEDICATION
1000.00 | Status: DC
Start: 2017-10-26 — End: 2017-10-26

## 2017-10-26 MED ORDER — AMLODIPINE BESYLATE 5 MG PO TABS
5.00 | ORAL_TABLET | ORAL | Status: DC
Start: 2017-10-26 — End: 2017-10-26

## 2017-10-26 MED ORDER — GABAPENTIN 100 MG PO CAPS
200.00 | ORAL_CAPSULE | ORAL | Status: DC
Start: 2017-10-25 — End: 2017-10-26

## 2017-10-26 MED ORDER — CALCIUM CARBONATE-VITAMIN D 600-400 MG-UNIT PO TABS
1.00 | ORAL_TABLET | ORAL | Status: DC
Start: 2017-10-25 — End: 2017-10-26

## 2017-10-26 MED ORDER — GENERIC EXTERNAL MEDICATION
Status: DC
Start: ? — End: 2017-10-26

## 2017-10-26 MED ORDER — VITAMIN C 500 MG PO TABS
500.00 | ORAL_TABLET | ORAL | Status: DC
Start: 2017-10-25 — End: 2017-10-26

## 2017-10-26 MED ORDER — METHOCARBAMOL 500 MG PO TABS
500.00 | ORAL_TABLET | ORAL | Status: DC
Start: 2017-10-25 — End: 2017-10-26

## 2017-10-26 MED ORDER — ALBUTEROL SULFATE HFA 108 (90 BASE) MCG/ACT IN AERS
2.00 | INHALATION_SPRAY | RESPIRATORY_TRACT | Status: DC
Start: ? — End: 2017-10-26

## 2017-10-26 MED ORDER — PANTOPRAZOLE SODIUM 40 MG PO TBEC
40.00 | DELAYED_RELEASE_TABLET | ORAL | Status: DC
Start: 2017-10-26 — End: 2017-10-26

## 2017-10-26 MED ORDER — ZINC SULFATE 220 (50 ZN) MG PO CAPS
220.00 | ORAL_CAPSULE | ORAL | Status: DC
Start: 2017-10-26 — End: 2017-10-26

## 2017-10-26 MED ORDER — OXYCODONE HCL 5 MG PO TABS
5.00 | ORAL_TABLET | ORAL | Status: DC
Start: ? — End: 2017-10-26

## 2017-10-26 MED ORDER — ACETAMINOPHEN 325 MG PO TABS
975.00 | ORAL_TABLET | ORAL | Status: DC
Start: 2017-10-25 — End: 2017-10-26

## 2017-10-26 MED ORDER — POLYETHYLENE GLYCOL 3350 17 G PO PACK
17.00 | PACK | ORAL | Status: DC
Start: ? — End: 2017-10-26

## 2017-10-26 MED ORDER — BISACODYL 10 MG RE SUPP
10.00 | RECTAL | Status: DC
Start: ? — End: 2017-10-26

## 2017-10-26 NOTE — Therapy (Signed)
[place] MAIN [facility] SERVICES [address] [place], [place], [address] Phone: [phone]   Fax:  [phone]  Physical Therapy Evaluation  Patient Details  Name: Jaclyn Hall MRN: [id number] Date of Birth: 1945-09-11 Referring Provider (PT): Merrilee Seashore, NP   Encounter Date: 10/26/2017    Past Medical History:  Diagnosis Date  . Arthritis   . Asthma   . Colon polyps   . Hypertension   . Osteoporosis   . Vertigo     Past Surgical History:  Procedure Laterality Date  . ABDOMINAL HYSTERECTOMY    . CHOLECYSTECTOMY    . COLONOSCOPY    . COLONOSCOPY N/A 11/22/2014   Procedure: COLONOSCOPY;  Surgeon: Lollie Sails, MD;  Location: [facility] ENDOSCOPY;  Service: Endoscopy;  Laterality: N/A;  . OOPHORECTOMY      There were no vitals filed for this visit.  Subjective Assessment - 10/26/17 1421    Subjective  Pt is a pleasant 72 year old female presenting to clinic s/p laminectomy at T4/5 with c/o of decreased mobility, decreased independence, increased pain, decreased strength in BLE, and decreased participation in social activities. Patient wears a TLSO when OOB and is performing log roll for bed mobility. Patient reports having knee buckling without predictability as well as hip pain L>R.  Patient describes B LE pain into her feet that goes to the toes and feels like burning "when you come in from the cold." She expressed that she is very motivated and has strong support at home and church. 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 Patient goes back to see her neurologist on 11/08/2017.     Patient is accompained by:  Family member   husband Licensed conveyancer)   Pertinent History  Patient is 72 year old female with diagnosis of spinal cord compression at T4/5 s/p laminectomy with decreased mobility and function. PMH significant for hypertension, osteoporosis, arthritis, and BPPV.     Limitations   Lifting;Standing;Walking;House hold activities    How long can you sit comfortably?  30 min    How long can you stand comfortably?  <5 min    How long can you walk comfortably?  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  Yes    Pain Score  4     Pain Location  Back    Pain Orientation  Mid;Lower    Pain Descriptors / Indicators  Numbness;Tightness;Burning    Pain Type  Acute pain    Pain Radiating Towards  abdomen to feet and toes     Pain Onset  1 to 4 weeks ago    Pain Frequency  Constant    Aggravating Factors   standing or WB    Pain Relieving Factors  rest, tylenol    Effect of Pain on Daily Activities  overall mobility limited    Multiple Pain Sites  Yes    Pain Score  4    Pain Location  Hip    Pain Orientation  Left;Right    Pain Descriptors / Indicators  Burning    Pain Radiating Towards  toes    Pain Onset  1 to 4 weeks ago    Pain Frequency  Intermittent    Aggravating Factors   standing, walking  Pain Relieving Factors  rest    Effect of Pain on Daily Activities  overall mobility limited        [facility] PT Assessment - 10/26/17 0001      Assessment   Medical Diagnosis  T4/5 spinal cord compression    Referring Provider (PT)  Merrilee Seashore, NP    Onset Date/Surgical Date  10/05/16    Hand Dominance  Right    Prior Therapy  inpatient ([facility]) s/p laminectomy 09/2017; outpatient for BPPV 2017      Precautions   Precautions  Back    Precaution Comments  WBAT, NO BLT    Required Braces or Orthoses  --   TLSO OOB     Restrictions   Weight Bearing Restrictions  Yes   WBAT, TLSO     Balance Screen   Has the patient fallen in the past 6 months  No    Has the patient had a decrease in activity level because of a fear of falling?   Yes      [place]  Private residence    Living  Arrangements  Spouse/significant other    Type of [place] to enter   3   Entrance Stairs-Number of Steps  3    Entrance Stairs-Rails  None   patio wall that can be reached to hold   Home Layout  One level    [place] - 2 wheels;Wheelchair - manual      Prior Function   Level of Independence  Independent    Vocation  Full time employment    Vocation Requirements  custodial work, 3rd shift      Cognition   Overall Cognitive Status  Within Functional Limits for tasks assessed    Attention  Focused    Focused Attention  Appears intact    Memory  Appears intact    Awareness  Appears intact    Problem Solving  Appears intact      Observation/Other Assessments   Modified [place]  48   severe disability     Sensation   Light Touch  Appears Intact      Coordination   Gross Motor Movements are Fluid and Coordinated  Yes    Fine Motor Movements are Fluid and Coordinated  Yes    Heel Shin Test  Limitations 2/2 ROM on L hip, no indication for coordination deficits      Posture/Postural Control   Posture/Postural Control  Postural limitations    Postural Limitations  Rounded Shoulders;Forward head;Increased thoracic kyphosis      ROM / Strength   AROM / PROM / Strength  AROM;Strength      AROM   Right/Left Shoulder  Right;Left   WFL for hygiene and ADL tasks   Right/Left Hip  Right;Left   WFL for BADLs and gait   Right/Left Knee  Right;Left   WNL   Right/Left Ankle  Right;Left   WNL     Strength   Strength Assessment Site  Hip;Knee;Ankle    Right/Left Hip  Right;Left    Right Hip Flexion  3/5    Right Hip ABduction  4+/5   sitting   Right Hip ADduction  4+/5   sitting   Left Hip Flexion  3+/5    Left Hip ABduction  4+/5   sitting   Left Hip ADduction  4+/5   sitting   Right/Left Knee  Right;Left  Right Knee Flexion  4-/5    Right Knee Extension  5/5    Left Knee Flexion  3+/5    Left Knee Extension  5/5    Right/Left  Ankle  Right;Left    Right Ankle Dorsiflexion  4/5   sitting   Right Ankle Plantar Flexion  4/5   sitting   Left Ankle Dorsiflexion  4/5   sitting   Left Ankle Plantar Flexion  4/5   sitting     Transfers   Transfers  Sit to Stand;Stand to Sit;Stand Pivot Transfers    Sit to Stand  6: Modified independent (Device/Increase time);5: Supervision    Sit to Stand Details  Verbal cues for technique    Five time sit to stand comments   29 sec    Stand to Sit  6: Modified independent (Device/Increase time)    Stand Pivot Transfers  4: Min guard      Ambulation/Gait   Ambulation/Gait  Yes    Ambulation/Gait Assistance  4: Min guard    Ambulation Distance (Feet)  25 Feet   25 ft, seated rest break, 20 ft standing rest break, 10 ft   Assistive device  Rolling walker    Gait Pattern  Step-through pattern;Within Functional Limits   increased WB through BUE   Ambulation Surface  Level;Indoor    Gait velocity  decreased      Static Sitting Balance   Static Sitting - Balance Support  No upper extremity supported    Static Sitting - Level of Assistance  7: Independent    Static Sitting - Comment/# of Minutes  unlimited      Static Standing Balance   Static Standing - Balance Support  No upper extremity supported    Static Standing - Level of Assistance  6: Modified independent (Device/Increase time)   RW available   Static Standing - Comment/# of Minutes  >5 minutes      Dynamic Standing Balance   Dynamic Standing - Balance Support  Right upper extremity supported;Left upper extremity supported   alternates support   Dynamic Standing - Level of Assistance  6: Modified independent (Device/Increase time);5: Stand by assistance    Dynamic Standing - Balance Activities  Forward lean/weight shifting;Lateral lean/weight shitfting;Reaching for objects    Dynamic Standing - Comments  No LOB, patient articulated balance strategies (ankle and stepping)      Standardized Balance Assessment    Standardized Balance Assessment  Five Times Sit to Stand;Berg Balance Test    Five times sit to stand comments   29 sec   increased risk for falls     Berg Balance Test   Sit to Stand  Able to stand  independently using hands    Standing Unsupported  Able to stand 2 minutes with supervision    Sitting with Back Unsupported but Feet Supported on Floor or Stool  Able to sit safely and securely 2 minutes    Stand to Sit  Controls descent by using hands    Transfers  Able to transfer with verbal cueing and /or supervision    Standing Unsupported with Eyes Closed  Unable to keep eyes closed 3 seconds but stays steady    Standing Ubsupported with Feet Together  Able to place feet together independently but unable to hold for 30 seconds    From Standing, Reach Forward with Outstretched Arm  Can reach forward >12 cm safely (5")    From Standing Position, Pick up Object from Floor  Unable  to try/needs assist to keep balance    From Standing Position, Turn to Look Behind Over each Shoulder  Needs assist to keep from losing balance and falling    Turn 360 Degrees  Able to turn 360 degrees safely but slowly    Standing Unsupported, Alternately Place Feet on Step/Stool  Needs assistance to keep from falling or unable to try    Standing Unsupported, One Foot in [employer] balance while stepping or standing    Standing on One Leg  Unable to try or needs assist to prevent fall    Total Score  23    Berg comment:  High Fall risk      High Level Balance   High Level Balance Activites  Turns    High Level Balance Comments  no LOB during turns around obstacles   RW       Objective measurements completed on examination: See above findings.   TREATMENT  Therapeutic exercise Glute sets x10 Sit-to-stand w/ RW and UE support: 3x5 with ample rest  Patient and spouse educated on proper body mechanics during transfers and bed mobility, HEP and reviewed precautions for transfers and overall safety.      PT End of Session - 10/27/17 0832    Visit Number  1    Number of Visits  25    Date for PT Re-Evaluation  01/19/18    Authorization Type  1/10 progress note (10/27/2017)    PT Start Time  1430    PT Stop Time  1526    PT Time Calculation (min)  56 min    Equipment Utilized During Treatment  Gait belt    Activity Tolerance  Patient tolerated treatment well;Patient limited by pain;Patient limited by fatigue;No increased pain    Behavior During Therapy  [facility] for tasks assessed/performed        Plan - 10/27/17 0834    Clinical Impression Statement  Patient is a pleasant 72 year old female presenting to clinic with decreased mobility, decreased strength, decreased balance, decreased endurance, and decreased tolerance to activity s/p T4/5 laminectomy. Patient demonstrates good understanding of precautions for movement (no bend, lift, twist), log roll technique for bed mobility, and safe transfers for sit <>stand. Patient's limitations in BLE strength is most evident in hip flexion (grossly 3/5) and knee flexion (grossly 3/5). Her decreased tolerance to activity and limitations in BLE muscular endurance are evident during gait with the patient able to ambulate 10-25 feet on level surface, CGA, using a RW  before patient decreases gait speed, narrows base of support, relies more heavily on UE support, and c/o of unsteadiness. Patient requires CGA for transfers to and from the w/c with minimal verbal cues for set-up. Patient will benefit from skilled therapeutic intervention to address aforementioned deficits and limitations, as well as improve overall QOL and return to PLOF (independent).     History and Personal Factors relevant to plan of care:  strong family support, prior level of function: independent, single story home, OA, HTN, osteoporosis, age, impaired sensation in BLE    Clinical Presentation  Evolving    Clinical Presentation due to:  s/p laminectomy, pain, TLSO and RW, pain, decreased  endurance    Clinical Decision Making  Moderate    Rehab Potential  Good    PT Frequency  2x / week    PT Duration  12 weeks    PT Treatment/Interventions  Cryotherapy;Moist Heat;Ultrasound;Gait training;Stair training;Functional mobility training;Therapeutic activities;Therapeutic exercise;Balance training;Neuromuscular re-education;Patient/family education;Orthotic Fit/Training;Manual  techniques;Scar mobilization;Dry needling;Energy conservation;Electrical Stimulation    PT Next Visit Plan  assess TUG if pt amenable, B LE strengthening, balance    PT Home Exercise Plan  glute sets, LAQ, sit-to-stand with supervision and RW, standing hip aBduction (HEP provided at hospital d/c)    Consulted and Agree with Plan of Care  Patient;Family member/caregiver    Family Member Consulted  Spouse      PT Short Term Goals - 10/27/17 [phone]      PT SHORT TERM GOAL #1   Title  Patient will be adherent to HEP at least 3x a week to improve functional strength in BLE and balance in order to decrease risk of falls and maintain safety at home.    Baseline  Not inititated (10/26/2017)    Time  3    Period  Weeks    Status  New    Target Date  11/17/17      PT SHORT TERM GOAL #2   Title  Patient will complete five times sit to stand test in < 24 seconds (2 MCIDs) indicating increased LE strength and improved balance in order to improve mobility and decrease risk of falls.    Baseline  (10/26/2017) 29 sec    Time  3    Period  Weeks    Status  New    Target Date  11/17/17      PT SHORT TERM GOAL #3   Title  Patient will ambulate with RW, SBA, on level surfaces for >150 feet with no LOB and no sitting breaks in order to demonstrate increased independence and improved mobility.    Baseline  (10/26/2017) <25 feet    Time  3    Period  Weeks    Status  New    Target Date  11/17/17      PT Long Term Goals - 10/27/17 1004      PT LONG TERM GOAL #1   Title  Patient will increase Berg Balance score to 46/56  to demonstrate decreased fall risk during functional activities and maintain safety.    Baseline  (10/26/2017) 23/56    Time  8    Period  Weeks    Status  New    Target Date  12/22/17      PT LONG TERM GOAL #2   Title  Patient will complete five times sit to stand test in < 15 seconds (2 MCIDs) indicating increased LE strength and improved balance in order to improve mobility and decrease risk of falls.    Baseline  (10/26/2017) 29 sec    Time  12    Period  Weeks    Status  New    Target Date  01/19/18      PT LONG TERM GOAL #3   Title  Patient will be independent in bending down towards floor and picking up small object (<5 pounds) and then stand back up without loss of balance in order to return to PLOF and meet demands at work.    Baseline  (10/26/2017) not assessed 2/2 BLT precautions    Time  12    Period  Weeks    Status  New    Target Date  01/19/18      PT LONG TERM GOAL #4   Title  Patient will reduce timed up and go to <11 seconds to reduce fall risk and demonstrate improved transfer/gait ability.    Baseline  10/26/2017: not assessed 2/2 to fatigue/endurance  Time  12    Period  Weeks    Status  New    Target Date  01/19/18         Patient will benefit from skilled therapeutic intervention in order to improve the following deficits and impairments:    Abnormal gait; Decreased endurance; Impaired sensation; Decreased activity tolerance; Decreased strength; Pain; Decreased balance; Decreased mobility; Difficulty walking; Improper body mechanics; Decreased range of motion; Decreased coordination; Postural dysfunction  Visit Diagnosis: Difficulty in walking, not elsewhere classified  Muscle weakness (generalized)     Problem List Patient Active Problem List   Diagnosis Date Noted  . Allergic state 03/14/2017  . Arthritis 03/14/2017  . Hypertension 03/14/2017  . Osteoporosis, post-menopausal 03/14/2017  . Benign paroxysmal positional vertigo due to bilateral  vestibular disorder 06/30/2015   Myles Gip PT, DPT [phone] 10/26/2017, 6:01 PM  [place] MAIN [facility] SERVICES [address] [place], [place], [address] Phone: [phone]   Fax:  [phone]  Name: Jaclyn Hall MRN: [id number] Date of Birth: August 21, 1945

## 2017-10-31 ENCOUNTER — Ambulatory Visit: Payer: BLUE CROSS/BLUE SHIELD

## 2017-10-31 DIAGNOSIS — M6281 Muscle weakness (generalized): Secondary | ICD-10-CM

## 2017-10-31 DIAGNOSIS — R262 Difficulty in walking, not elsewhere classified: Secondary | ICD-10-CM

## 2017-10-31 NOTE — Therapy (Signed)
[place] MAIN [facility] SERVICES [address] [place], [place], [address] Phone: [phone]   Fax:  [phone]  Physical Therapy Treatment  Patient Details  Name: Jaclyn Hall MRN: [id number] Date of Birth: [birth year] Referring Provider (PT): Merrilee Seashore, NP   Encounter Date: 10/31/2017  PT End of Session - 10/31/17 1441    Visit Number  2    Number of Visits  25    Date for PT Re-Evaluation  01/19/18    Authorization Type  1/10 progress note (10/27/2017)    PT Start Time  1432    PT Stop Time  1512    PT Time Calculation (min)  40 min    Activity Tolerance  Patient tolerated treatment well;Patient limited by fatigue;No increased pain    Behavior During Therapy  WFL for tasks assessed/performed       Past Medical History:  Diagnosis Date  . Arthritis   . Asthma   . Colon polyps   . Hypertension   . Osteoporosis   . Vertigo     Past Surgical History:  Procedure Laterality Date  . ABDOMINAL HYSTERECTOMY    . CHOLECYSTECTOMY    . COLONOSCOPY    . COLONOSCOPY N/A 11/22/2014   Procedure: COLONOSCOPY;  Surgeon: Lollie Sails, MD;  Location: [facility] ENDOSCOPY;  Service: Endoscopy;  Laterality: N/A;  . OOPHORECTOMY      There were no vitals filed for this visit.  Subjective Assessment - 10/31/17 1434    Subjective  Pt reports things are going well in general. She reports some continued pain in the Left trochanteric area of the hip, particularly worse with light or sustained pressure. Pt reports continued numbness in toes, but motor funciton seems to be improving. Motor fucniton in legs seems transient per patient, esp regarding HEP performance.     Patient is accompained by:  Family member   [name]    Pertinent History  Patient is 72 year old female with diagnosis of spinal cord compression at T4/5 s/p laminectomy with decreased mobility and function. PMH significant for hypertension, osteoporosis, arthritis, and BPPV.     Currently in Pain?  No/denies    Pain Score  5     Pain Location  --   Left hip and Left Knee       Therapeutic Exercise:  -Stand pivot transfer WC to/from plinth, no Assistive device, MinA for hand jhelp assist for safety.   -Bridging 2x15, heels wide and near buttocks to target hip extensors and decrease lumbar spine loading, VC to knee control in transverse plane -Heel Slides 2x15 bilat (modA for AA/ROM on Right) -Hooklying Marching: 2x15, MaxA for AA/ROM needed bilat -SAQ over 9" bolster: 2x15 bilat, VC for eccentric control  -Hooklying Clamshells with yellowTB, soccer ball betwixt feet 2x15, (mina to LLE to equalize excursion of each limb)  -seated STS transfers elevated plinth, with chair back self assist: 1x15  -seated heel raises 1x20 -seated ankle DF 1x15      PT Short Term Goals - 10/27/17 0956      PT SHORT TERM GOAL #1   Title  Patient will be adherent to HEP at least 3x a week to improve functional strength in BLE and balance in order to decrease risk of falls and maintain safety at home.    Baseline  Not inititated (10/26/2017)    Time  3    Period  Weeks    Status  New    Target Date  11/17/17      PT SHORT TERM GOAL #2   Title  Patient will complete five times sit to stand test in < 24 seconds (2 MCIDs) indicating increased LE strength and improved balance in order to improve mobility and decrease risk of falls.    Baseline  (10/26/2017) 29 sec    Time  3    Period  Weeks    Status  New    Target Date  11/17/17      PT SHORT TERM GOAL #3   Title  Patient will ambulate with RW, SBA, on level surfaces for >150 feet with no LOB and no sitting breaks in order to demonstrate increased independence and improved mobility.    Baseline  (10/26/2017) <25 feet    Time  3    Period  Weeks    Status  New    Target Date  11/17/17        PT Long Term Goals - 10/27/17 1004      PT LONG TERM GOAL #1   Title  Patient will increase Berg Balance score to 46/56 to  demonstrate decreased fall risk during functional activities and maintain safety.    Baseline  (10/26/2017) 23/56    Time  8    Period  Weeks    Status  New    Target Date  12/22/17      PT LONG TERM GOAL #2   Title  Patient will complete five times sit to stand test in < 15 seconds (2 MCIDs) indicating increased LE strength and improved balance in order to improve mobility and decrease risk of falls.    Baseline  (10/26/2017) 29 sec    Time  12    Period  Weeks    Status  New    Target Date  01/19/18      PT LONG TERM GOAL #3   Title  Patient will be independent in bending down towards floor and picking up small object (<5 pounds) and then stand back up without loss of balance in order to return to PLOF and meet demands at work.    Baseline  (10/26/2017) not assessed 2/2 BLT precautions    Time  12    Period  Weeks    Status  New    Target Date  01/19/18      PT LONG TERM GOAL #4   Title  Patient will reduce timed up and go to <11 seconds to reduce fall risk and demonstrate improved transfer/gait ability.    Baseline  10/26/2017: not assessed 2/2 to fatigue/endurance    Time  12    Period  Weeks    Status  New    Target Date  01/19/18            Plan - 10/31/17 1500    Clinical Impression Statement  Pt tolerating session well today overall, able to progress generalized strengthening program for hips, knees, ankles, and trunk. Pt requires intermittent physical assistance for varying muscle groups throughout, but is encouraged to control movement. Pt remains highly motivated, and is less limited by pain this date. TLSO is maintained throuhgout.     Rehab Potential  Good    PT Frequency  2x / week    PT Duration  12 weeks    PT Treatment/Interventions  Cryotherapy;Moist Heat;Ultrasound;Gait training;Stair training;Functional mobility training;Therapeutic activities;Therapeutic exercise;Balance training;Neuromuscular re-education;Patient/family education;Orthotic  Fit/Training;Manual techniques;Scar mobilization;Dry needling;Energy conservation;Electrical Stimulation    PT Next Visit Plan  assess TUG  if pt amenable, B LE strengthening, balance    PT Home Exercise Plan  glute sets, LAQ, sit-to-stand with supervision and RW, standing hip aBduction (HEP provided at hospital d/c)    Consulted and Agree with Plan of Care  Patient;Family member/caregiver    Family Member Consulted  Spouse       Patient will benefit from skilled therapeutic intervention in order to improve the following deficits and impairments:  Abnormal gait, Decreased endurance, Impaired sensation, Decreased activity tolerance, Decreased strength, Pain, Decreased balance, Decreased mobility, Difficulty walking, Improper body mechanics, Decreased range of motion, Decreased coordination, Postural dysfunction  Visit Diagnosis: Difficulty in walking, not elsewhere classified  Muscle weakness (generalized)     Problem List Patient Active Problem List   Diagnosis Date Noted  . Allergic state 03/14/2017  . Arthritis 03/14/2017  . Hypertension 03/14/2017  . Osteoporosis, post-menopausal 03/14/2017  . Benign paroxysmal positional vertigo due to bilateral vestibular disorder 06/30/2015   3:12 PM, 10/31/17 Etta Grandchild, PT, DPT Physical Therapist - [facility]  Outpatient Physical Therapy- [place] [phone]     Etta Grandchild 10/31/2017, 3:05 PM  [place] MAIN [facility] SERVICES [address] [place], [place], [address] Phone: [phone]   Fax:  [phone]  Name: SKYLAN GIFT MRN: [id number] Date of Birth: 03-03-1945

## 2017-11-02 ENCOUNTER — Ambulatory Visit: Payer: BLUE CROSS/BLUE SHIELD

## 2017-11-02 DIAGNOSIS — M6281 Muscle weakness (generalized): Secondary | ICD-10-CM

## 2017-11-02 DIAGNOSIS — R262 Difficulty in walking, not elsewhere classified: Secondary | ICD-10-CM

## 2017-11-02 NOTE — Therapy (Signed)
[place] MAIN [facility] SERVICES [address] [place], [place], [address] Phone: [phone]   Fax:  [phone]  Physical Therapy Treatment  Patient Details  Name: Jaclyn Hall MRN: [id number] Date of Birth: 09/01/1945 Referring Provider (PT): Merrilee Seashore, NP   Encounter Date: 11/02/2017  PT End of Session - 11/02/17 1307    Visit Number  3    Number of Visits  25    Date for PT Re-Evaluation  01/19/18    Authorization Type  3/10 progress note (10/27/2017)    PT Start Time  1302    PT Stop Time  1345    PT Time Calculation (min)  43 min    Activity Tolerance  Patient tolerated treatment well;Patient limited by fatigue;No increased pain    Behavior During Therapy  WFL for tasks assessed/performed       Past Medical History:  Diagnosis Date  . Arthritis   . Asthma   . Colon polyps   . Hypertension   . Osteoporosis   . Vertigo     Past Surgical History:  Procedure Laterality Date  . ABDOMINAL HYSTERECTOMY    . CHOLECYSTECTOMY    . COLONOSCOPY    . COLONOSCOPY N/A 11/22/2014   Procedure: COLONOSCOPY;  Surgeon: Lollie Sails, MD;  Location: [facility] ENDOSCOPY;  Service: Endoscopy;  Laterality: N/A;  . OOPHORECTOMY      There were no vitals filed for this visit.  Subjective Assessment - 11/02/17 1301    Subjective  Pt reports things are going well in general. She denies any pain upon arrival but reports feeling tired. No specific questions or concerns at this time.    Patient is accompained by:  Family member   [name]    Pertinent History  Patient is 72 year old female with diagnosis of spinal cord compression at T4/5 s/p laminectomy with decreased mobility and function. PMH significant for hypertension, osteoporosis, arthritis, and BPPV.     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  No/denies        TREATMENT  Ther-ex  Octane L2 x 4 minutes for warm-up during history (3 minutes unbilled); TUG performed with patient who took 43 seconds to perform Sit to stand without UE support from elevated mat table 2 x 10; Hooklying marching: 2 x 15; Hooklying bridging 2 x 15, heels wide and near buttocks to target hip extensors and decrease lumbar spine loading, VC to knee control in transverse plane Heel Slides 2 x 15 bilat (modA for AA/ROM on Right) Hooklying isometric clams with manual resistance 3s hold x 10; Hooklying isometric adduction squeeze with manual resistance 3s hold x 10; SAQ over 9" bolster x 15 bilateral;    Pt educated throughout session about proper posture and technique with exercises. Improved exercise technique, movement at target joints, use of target muscles after min to mod verbal, visual, tactile cues.    Pt demonstrates excellent motivation during session. She is able to progress generalized strengthening program for hips, knees, ankles, and trunk. She is very weak with hip flexion and requires assist for motor control. TLSO is maintained throuhgout. Pt will benefit from PT services to address deficits in strength, balance, and mobility in order to return to full function at home and work.  PT Short Term Goals - 10/27/17 0956      PT SHORT TERM GOAL #1   Title  Patient will be adherent to HEP at least 3x a week to improve functional strength in BLE and balance in order to decrease risk of falls and maintain safety at home.    Baseline  Not inititated (10/26/2017)    Time  3    Period  Weeks    Status  New    Target Date  11/17/17      PT SHORT TERM GOAL #2   Title  Patient will complete five times sit to stand test in < 24 seconds (2 MCIDs) indicating increased LE strength and improved balance in order to improve mobility and decrease risk of falls.    Baseline  (10/26/2017) 29 sec    Time  3     Period  Weeks    Status  New    Target Date  11/17/17      PT SHORT TERM GOAL #3   Title  Patient will ambulate with RW, SBA, on level surfaces for >150 feet with no LOB and no sitting breaks in order to demonstrate increased independence and improved mobility.    Baseline  (10/26/2017) <25 feet    Time  3    Period  Weeks    Status  New    Target Date  11/17/17        PT Long Term Goals - 11/02/17 1315      PT LONG TERM GOAL #1   Title  Patient will increase Berg Balance score to 46/56 to demonstrate decreased fall risk during functional activities and maintain safety.    Baseline  (10/26/2017) 23/56    Time  8    Period  Weeks    Status  New    Target Date  12/22/17      PT LONG TERM GOAL #2   Title  Patient will complete five times sit to stand test in < 15 seconds (2 MCIDs) indicating increased LE strength and improved balance in order to improve mobility and decrease risk of falls.    Baseline  (10/26/2017) 29 sec    Time  12    Period  Weeks    Status  New    Target Date  01/19/18      PT LONG TERM GOAL #3   Title  Patient will be independent in bending down towards floor and picking up small object (<5 pounds) and then stand back up without loss of balance in order to return to PLOF and meet demands at work.    Baseline  (10/26/2017) not assessed 2/2 BLT precautions    Time  12    Period  Weeks    Status  New    Target Date  01/19/18      PT LONG TERM GOAL #4   Title  Patient will reduce timed up and go to <11 seconds to reduce fall risk and demonstrate improved transfer/gait ability.    Baseline  10/26/2017: not assessed 2/2 to fatigue/endurance; 11/02/17: 43 seconds    Time  12    Period  Weeks    Status  New    Target Date  01/19/17            Plan - 11/02/17 1307    Clinical Impression Statement  Pt demonstrates excellent motivation during session. She is able to progress generalized strengthening program for hips, knees, ankles, and trunk. She is  very  weak with hip flexion and requires assist for motor control. TLSO is maintained throuhgout. Pt will benefit from PT services to address deficits in strength, balance, and mobility in order to return to full function at home and work.     Rehab Potential  Good    PT Frequency  2x / week    PT Duration  12 weeks    PT Treatment/Interventions  Cryotherapy;Moist Heat;Ultrasound;Gait training;Stair training;Functional mobility training;Therapeutic activities;Therapeutic exercise;Balance training;Neuromuscular re-education;Patient/family education;Orthotic Fit/Training;Manual techniques;Scar mobilization;Dry needling;Energy conservation;Electrical Stimulation    PT Next Visit Plan  B LE strengthening, balance    PT Home Exercise Plan  glute sets, LAQ, sit-to-stand with supervision and RW, standing hip aBduction (HEP provided at hospital d/c)    Consulted and Agree with Plan of Care  Patient;Family member/caregiver    Family Member Consulted  Spouse       Patient will benefit from skilled therapeutic intervention in order to improve the following deficits and impairments:  Abnormal gait, Decreased endurance, Impaired sensation, Decreased activity tolerance, Decreased strength, Pain, Decreased balance, Decreased mobility, Difficulty walking, Improper body mechanics, Decreased range of motion, Decreased coordination, Postural dysfunction  Visit Diagnosis: Difficulty in walking, not elsewhere classified  Muscle weakness (generalized)     Problem List Patient Active Problem List   Diagnosis Date Noted  . Allergic state 03/14/2017  . Arthritis 03/14/2017  . Hypertension 03/14/2017  . Osteoporosis, post-menopausal 03/14/2017  . Benign paroxysmal positional vertigo due to bilateral vestibular disorder 06/30/2015   Phillips Grout PT, DPT, GCS  Aeralyn Barna 11/02/2017, 1:46 PM  [place] MAIN [facility] SERVICES [address] [place], [place], [address] Phone:  [phone]   Fax:  [phone]  Name: Jaclyn Hall MRN: [id number] Date of Birth: Mar 06, 1945

## 2017-11-07 ENCOUNTER — Ambulatory Visit: Payer: BLUE CROSS/BLUE SHIELD

## 2017-11-07 DIAGNOSIS — M6281 Muscle weakness (generalized): Secondary | ICD-10-CM

## 2017-11-07 DIAGNOSIS — R262 Difficulty in walking, not elsewhere classified: Secondary | ICD-10-CM | POA: Diagnosis not present

## 2017-11-07 NOTE — Therapy (Signed)
[place] MAIN [facility] SERVICES [address] [place], [place], [address] Phone: [phone]   Fax:  [phone]  Physical Therapy Treatment  Patient Details  Name: Jaclyn Hall MRN: [id number] Date of Birth: 22-Apr-1945 Referring Provider (PT): Merrilee Seashore, NP   Encounter Date: 11/07/2017  PT End of Session - 11/07/17 1436    Visit Number  4    Number of Visits  25    Date for PT Re-Evaluation  01/19/18    Authorization Type  4/10 progress note (10/27/2017)    PT Start Time  1430    PT Stop Time  1515    PT Time Calculation (min)  45 min    Activity Tolerance  Patient tolerated treatment well;Patient limited by fatigue;No increased pain    Behavior During Therapy  WFL for tasks assessed/performed       Past Medical History:  Diagnosis Date  . Arthritis   . Asthma   . Colon polyps   . Hypertension   . Osteoporosis   . Vertigo     Past Surgical History:  Procedure Laterality Date  . ABDOMINAL HYSTERECTOMY    . CHOLECYSTECTOMY    . COLONOSCOPY    . COLONOSCOPY N/A 11/22/2014   Procedure: COLONOSCOPY;  Surgeon: Lollie Sails, MD;  Location: [facility] ENDOSCOPY;  Service: Endoscopy;  Laterality: N/A;  . OOPHORECTOMY      There were no vitals filed for this visit.  Subjective Assessment - 11/07/17 1435    Subjective  Pt reports that she is doing well. She  denies any pain upon arrival but reports some R posterior shoulder pain last night. No specific questions or concerns at this time.    Patient is accompained by:  Family member   [name]    Pertinent History  Patient is 72 year old female with diagnosis of spinal cord compression at T4/5 s/p laminectomy with decreased mobility and function. PMH significant for hypertension, osteoporosis, arthritis, and BPPV.     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  No/denies           TREATMENT  Ther-ex  NuStep L2 x 5 minutes for warm-up during history (4 minutes unbilled); Sit to stand without UE support from elevated mat table at progressively lower height 2 x 10; Hooklying marching x 15; Hooklying bridging x 15, heels wide and near buttocks as tolerated to target hip extensors and decrease lumbar spine loading, VC to knee control in transverse plane. Pt with difficulty controlling knees so ball placed between knees for active adduction; Hooklying manually resisted isometric trunk rotation 5s hold x 10 each direction; Hooklying clams with manual resistance 3s hold x 15; Hooklying adduction squeeze with manual resistance 3s hold x 15; Seated LAQ with manual resistance x 15 bilateral; Seated HS curls with green tband x 15 bilateral; Mini squats in // bars x 10; Standing heel raises in // bars x 10; Standing hip abduction in // bars x 10     Pt educated throughout session about proper posture and technique with exercises. Improved exercise technique, movement at target joints, use of target muscles after min to mod verbal, visual, tactile cues.    Pt demonstrates excellent motivation during session. She continues to demonstrate progress with therapy but fatigues easily and continues to experience intermittent  LE buckling. Seated and stnading rest breaks provided throughout session. She relies heavily on UE support on walker during ambulation due to LE weakness. TLSO is maintained throuhgout session. Pt will benefit from continued PT services to address deficits in strength, balance, and mobility in order to return to full function at home and work.                        PT Short Term Goals - 10/27/17 0956      PT SHORT TERM GOAL #1   Title  Patient will be adherent to HEP at least 3x a week to improve functional strength in BLE and balance in order to decrease risk of falls and  maintain safety at home.    Baseline  Not inititated (10/26/2017)    Time  3    Period  Weeks    Status  New    Target Date  11/17/17      PT SHORT TERM GOAL #2   Title  Patient will complete five times sit to stand test in < 24 seconds (2 MCIDs) indicating increased LE strength and improved balance in order to improve mobility and decrease risk of falls.    Baseline  (10/26/2017) 29 sec    Time  3    Period  Weeks    Status  New    Target Date  11/17/17      PT SHORT TERM GOAL #3   Title  Patient will ambulate with RW, SBA, on level surfaces for >150 feet with no LOB and no sitting breaks in order to demonstrate increased independence and improved mobility.    Baseline  (10/26/2017) <25 feet    Time  3    Period  Weeks    Status  New    Target Date  11/17/17        PT Long Term Goals - 11/02/17 1315      PT LONG TERM GOAL #1   Title  Patient will increase Berg Balance score to 46/56 to demonstrate decreased fall risk during functional activities and maintain safety.    Baseline  (10/26/2017) 23/56    Time  8    Period  Weeks    Status  New    Target Date  12/22/17      PT LONG TERM GOAL #2   Title  Patient will complete five times sit to stand test in < 15 seconds (2 MCIDs) indicating increased LE strength and improved balance in order to improve mobility and decrease risk of falls.    Baseline  (10/26/2017) 29 sec    Time  12    Period  Weeks    Status  New    Target Date  01/19/18      PT LONG TERM GOAL #3   Title  Patient will be independent in bending down towards floor and picking up small object (<5 pounds) and then stand back up without loss of balance in order to return to PLOF and meet demands at work.    Baseline  (10/26/2017) not assessed 2/2 BLT precautions    Time  12    Period  Weeks    Status  New    Target Date  01/19/18      PT LONG TERM GOAL #4   Title  Patient will reduce timed up and go to <11 seconds to reduce fall risk and demonstrate improved  transfer/gait ability.    Baseline  10/26/2017: not assessed 2/2 to fatigue/endurance; 11/02/17: 43 seconds    Time  12    Period  Weeks    Status  New    Target Date  01/19/17            Plan - 11/07/17 1436    Clinical Impression Statement  Pt demonstrates excellent motivation during session. She continues to demonstrate progress with therapy but fatigues easily and continues to experience intermittent LE buckling. Seated and stnading rest breaks provided throughout session. She relies heavily on UE support on walker during ambulation due to LE weakness. TLSO is maintained throuhgout session. Pt will benefit from continued PT services to address deficits in strength, balance, and mobility in order to return to full function at home and work.     Rehab Potential  Good    PT Frequency  2x / week    PT Duration  12 weeks    PT Treatment/Interventions  Cryotherapy;Moist Heat;Ultrasound;Gait training;Stair training;Functional mobility training;Therapeutic activities;Therapeutic exercise;Balance training;Neuromuscular re-education;Patient/family education;Orthotic Fit/Training;Manual techniques;Scar mobilization;Dry needling;Energy conservation;Electrical Stimulation    PT Next Visit Plan  B LE strengthening, balance    PT Home Exercise Plan  glute sets, LAQ, sit-to-stand with supervision and RW, standing hip aBduction (HEP provided at hospital d/c)    Consulted and Agree with Plan of Care  Patient;Family member/caregiver    Family Member Consulted  Spouse       Patient will benefit from skilled therapeutic intervention in order to improve the following deficits and impairments:  Abnormal gait, Decreased endurance, Impaired sensation, Decreased activity tolerance, Decreased strength, Pain, Decreased balance, Decreased mobility, Difficulty walking, Improper body mechanics, Decreased range of motion, Decreased coordination, Postural dysfunction  Visit Diagnosis: Difficulty in walking, not  elsewhere classified  Muscle weakness (generalized)     Problem List Patient Active Problem List   Diagnosis Date Noted  . Allergic state 03/14/2017  . Arthritis 03/14/2017  . Hypertension 03/14/2017  . Osteoporosis, post-menopausal 03/14/2017  . Benign paroxysmal positional vertigo due to bilateral vestibular disorder 06/30/2015   Phillips Grout PT, DPT, GCS  Midori Dado 11/07/2017, 5:18 PM  [place] MAIN [facility] SERVICES [address] [place], [place], [address] Phone: [phone]   Fax:  [phone]  Name: Jaclyn Hall MRN: [id number] Date of Birth: [birth year]

## 2017-11-09 ENCOUNTER — Ambulatory Visit: Payer: BLUE CROSS/BLUE SHIELD | Admitting: Physical Therapy

## 2017-11-09 ENCOUNTER — Encounter: Payer: Self-pay | Admitting: Physical Therapy

## 2017-11-09 DIAGNOSIS — M6281 Muscle weakness (generalized): Secondary | ICD-10-CM

## 2017-11-09 DIAGNOSIS — R262 Difficulty in walking, not elsewhere classified: Secondary | ICD-10-CM | POA: Diagnosis not present

## 2017-11-09 NOTE — Therapy (Signed)
[place] MAIN [facility] SERVICES [address] [place], [place], [address] Phone: [phone]   Fax:  [phone]  Physical Therapy Treatment  Patient Details  Name: CINDI GHAZARIAN MRN: [id number] Date of Birth: 1945/08/24 Referring Provider (PT): Merrilee Seashore, NP   Encounter Date: 11/09/2017  PT End of Session - 11/09/17 1506    Visit Number  5    Number of Visits  25    Date for PT Re-Evaluation  01/19/18    Authorization Type  4/10 progress note (10/27/2017)    PT Start Time  1430    PT Stop Time  1515    PT Time Calculation (min)  45 min    Equipment Utilized During Treatment  Gait belt    Activity Tolerance  Patient tolerated treatment well;Patient limited by fatigue;No increased pain    Behavior During Therapy  WFL for tasks assessed/performed       Past Medical History:  Diagnosis Date  . Arthritis   . Asthma   . Colon polyps   . Hypertension   . Osteoporosis   . Vertigo     Past Surgical History:  Procedure Laterality Date  . ABDOMINAL HYSTERECTOMY    . CHOLECYSTECTOMY    . COLONOSCOPY    . COLONOSCOPY N/A 11/22/2014   Procedure: COLONOSCOPY;  Surgeon: Lollie Sails, MD;  Location: [facility] ENDOSCOPY;  Service: Endoscopy;  Laterality: N/A;  . OOPHORECTOMY      There were no vitals filed for this visit.  Subjective Assessment - 11/09/17 1502    Subjective  Patient states she is doing well today and is excited to report that her MD told her to stop wearing the TLSO. She also stated that at her MD appt they were happy with the imaging done yesterday.    Patient is accompained by:  Family member   [name]    Pertinent History  Patient is 72 year old female with diagnosis of spinal cord compression at T4/5 s/p laminectomy with decreased mobility and function. PMH significant for hypertension, osteoporosis, arthritis, and BPPV.     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  No/denies       TREATMENT  Therapeutic Exercise: Sit<>stand w/ airex, no UE, 2x10 Seated B marching, 3# cuff, x30 B LAQ, 3# cuff, 2x10 // bars:  gait fwd/bwd, BUE x3 laps, SBA   gait fwd, LUE x 3 laps, SBA   gait fwd, RUE x 2 laps, SBA   side-stepping, BUE, x4 laps, SBA  Patient educated on body mechanics, posture, stride length, foot clearance, and HEP.  Patient continues to experience knee buckling and unsteadiness during exercise. She continues to require frequent rest breaks. During gait in // bars, patient was educated on the faded UE support as a step toward AD progression. Patient stated that she prefers her right hand for that. Patient was educated on the benfit of L hand support and through demonstration, patient agreed that LUE support created more normalized and steady gait pattern.  Treatments unbilled: Nu-step, L2 4 min, L3, 1.5 min, SPM 80-90    PT Short Term Goals - 10/27/17 0956      PT SHORT TERM GOAL #1   Title  Patient will be adherent to HEP at least 3x  a week to improve functional strength in BLE and balance in order to decrease risk of falls and maintain safety at home.    Baseline  Not inititated (10/26/2017)    Time  3    Period  Weeks    Status  New    Target Date  11/17/17      PT SHORT TERM GOAL #2   Title  Patient will complete five times sit to stand test in < 24 seconds (2 MCIDs) indicating increased LE strength and improved balance in order to improve mobility and decrease risk of falls.    Baseline  (10/26/2017) 29 sec    Time  3    Period  Weeks    Status  New    Target Date  11/17/17      PT SHORT TERM GOAL #3   Title  Patient will ambulate with RW, SBA, on level surfaces for >150 feet with no LOB and no sitting breaks in order to demonstrate increased independence and improved  mobility.    Baseline  (10/26/2017) <25 feet    Time  3    Period  Weeks    Status  New    Target Date  11/17/17        PT Long Term Goals - 11/02/17 1315      PT LONG TERM GOAL #1   Title  Patient will increase Berg Balance score to 46/56 to demonstrate decreased fall risk during functional activities and maintain safety.    Baseline  (10/26/2017) 23/56    Time  8    Period  Weeks    Status  New    Target Date  12/22/17      PT LONG TERM GOAL #2   Title  Patient will complete five times sit to stand test in < 15 seconds (2 MCIDs) indicating increased LE strength and improved balance in order to improve mobility and decrease risk of falls.    Baseline  (10/26/2017) 29 sec    Time  12    Period  Weeks    Status  New    Target Date  01/19/18      PT LONG TERM GOAL #3   Title  Patient will be independent in bending down towards floor and picking up small object (<5 pounds) and then stand back up without loss of balance in order to return to PLOF and meet demands at work.    Baseline  (10/26/2017) not assessed 2/2 BLT precautions    Time  12    Period  Weeks    Status  New    Target Date  01/19/18      PT LONG TERM GOAL #4   Title  Patient will reduce timed up and go to <11 seconds to reduce fall risk and demonstrate improved transfer/gait ability.    Baseline  10/26/2017: not assessed 2/2 to fatigue/endurance; 11/02/17: 43 seconds    Time  12    Period  Weeks    Status  New    Target Date  01/19/17            Plan - 11/09/17 1525    Clinical Impression Statement  Patient continues to present to clinicmotivated to participate in physical therapy. She demonstrates improving strength in BLE as evidenced by her ability to sit<>stand from an elevated surface witout use of UE.  Deficits remain in strength, activity tolerance, motor control, and balance which will continue to benefit from skilled therapeutic intervention.  Rehab Potential  Good    PT Frequency  2x / week     PT Duration  12 weeks    PT Treatment/Interventions  Cryotherapy;Moist Heat;Ultrasound;Gait training;Stair training;Functional mobility training;Therapeutic activities;Therapeutic exercise;Balance training;Neuromuscular re-education;Patient/family education;Orthotic Fit/Training;Manual techniques;Scar mobilization;Dry needling;Energy conservation;Electrical Stimulation    PT Next Visit Plan  B LE strengthening, balance    PT Home Exercise Plan  glute sets, LAQ, sit-to-stand with supervision and RW, standing hip aBduction (HEP provided at hospital d/c)    Consulted and Agree with Plan of Care  Patient;Family member/caregiver    Family Member Consulted  Spouse       Patient will benefit from skilled therapeutic intervention in order to improve the following deficits and impairments:  Abnormal gait, Decreased endurance, Impaired sensation, Decreased activity tolerance, Decreased strength, Pain, Decreased balance, Decreased mobility, Difficulty walking, Improper body mechanics, Decreased range of motion, Decreased coordination, Postural dysfunction  Visit Diagnosis: Difficulty in walking, not elsewhere classified  Muscle weakness (generalized)     Problem List Patient Active Problem List   Diagnosis Date Noted  . Allergic state 03/14/2017  . Arthritis 03/14/2017  . Hypertension 03/14/2017  . Osteoporosis, post-menopausal 03/14/2017  . Benign paroxysmal positional vertigo due to bilateral vestibular disorder 06/30/2015   Myles Gip PT, DPT [phone] 11/09/2017, 3:27 PM  [place] MAIN [facility] SERVICES [address] [place], [place], [address] Phone: [phone]   Fax:  [phone]  Name: TREVA HUYETT MRN: [id number] Date of Birth: 10/25/1945

## 2017-11-14 ENCOUNTER — Ambulatory Visit: Payer: BLUE CROSS/BLUE SHIELD

## 2017-11-14 DIAGNOSIS — M6281 Muscle weakness (generalized): Secondary | ICD-10-CM

## 2017-11-14 DIAGNOSIS — R262 Difficulty in walking, not elsewhere classified: Secondary | ICD-10-CM | POA: Diagnosis not present

## 2017-11-14 NOTE — Therapy (Signed)
[place] MAIN [facility] SERVICES [address] [place], [place], [address] Phone: [phone]   Fax:  [phone]  Physical Therapy Treatment  Patient Details  Name: Jaclyn Hall MRN: [id number] Date of Birth: 03-Feb-1945 Referring Provider (PT): Merrilee Seashore, NP   Encounter Date: 11/14/2017  PT End of Session - 11/14/17 1441    Visit Number  6    Number of Visits  25    Date for PT Re-Evaluation  01/19/18    Authorization Type  5/10 progress note (10/27/2017)    PT Start Time  1430    PT Stop Time  1515    PT Time Calculation (min)  45 min    Equipment Utilized During Treatment  Gait belt    Activity Tolerance  Patient tolerated treatment well;Patient limited by fatigue;No increased pain    Behavior During Therapy  WFL for tasks assessed/performed       Past Medical History:  Diagnosis Date  . Arthritis   . Asthma   . Colon polyps   . Hypertension   . Osteoporosis   . Vertigo     Past Surgical History:  Procedure Laterality Date  . ABDOMINAL HYSTERECTOMY    . CHOLECYSTECTOMY    . COLONOSCOPY    . COLONOSCOPY N/A 11/22/2014   Procedure: COLONOSCOPY;  Surgeon: Lollie Sails, MD;  Location: [facility] ENDOSCOPY;  Service: Endoscopy;  Laterality: N/A;  . OOPHORECTOMY      There were no vitals filed for this visit.  Subjective Assessment - 11/14/17 1438    Subjective  Patient states she is doing well today. She saw her neurologist and he was suprised that she had the TLSO brace because he never prescribed it for her. No specific questions or concerns at this time. Pt arrives without wearing brace and states she has discontinued the brace    Patient is accompained by:  Family member   [name]    Pertinent History  Patient is 72 year old female with diagnosis of spinal cord compression at T4/5 s/p laminectomy with decreased mobility and function. PMH significant for hypertension, osteoporosis, arthritis, and BPPV.     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  No/denies           TREATMENT  Therapeutic Exercise: Nu-step L2 x 5 minutes for warm-up during history (2 minutes unbilled); Gait in gym x 150' with rolling walker, fatigue monitored  Sit<>stand from mat table without UE support 2 x 10; Seated clams with manual resistance 2 x 10; Setaed adductor squeeze with manual resistance 2 x 10; Seated isometric trunk flexion, extension, and lateral flexion with manual resistance x 5 each Hooklying SLR 2 x 10 bilateral; Hooklying bridges with adductor ball squeeze to prevent hip ER 2 x 10; Seated LAQ with manual resistance x 15 bilateral; Seated HS curls with green tband x 15 bilateral;   Pt educated throughout session about proper posture and technique with exercises. Improved exercise technique, movement at target joints, use of target muscles after min to mod verbal, visual, tactile cues.    Patient continues to demonstrate excellent motivation during session. She has more truncal instability without her TLSO brace so worked on some isometric core strengthening. LE fatigue noted during gait with intermittent buckling.  Pt encouraged to continue HEP. Deficits remain in strength, activity tolerance, motor control, and balance which will continue to benefit from skilled therapeutic intervention.                      PT Short Term Goals - 10/27/17 0956      PT SHORT TERM GOAL #1   Title  Patient will be adherent to HEP at least 3x a week to improve functional strength in BLE and balance in order to decrease risk of falls and maintain safety at home.    Baseline  Not inititated (10/26/2017)    Time  3    Period  Weeks    Status  New    Target Date  11/17/17      PT SHORT TERM GOAL  #2   Title  Patient will complete five times sit to stand test in < 24 seconds (2 MCIDs) indicating increased LE strength and improved balance in order to improve mobility and decrease risk of falls.    Baseline  (10/26/2017) 29 sec    Time  3    Period  Weeks    Status  New    Target Date  11/17/17      PT SHORT TERM GOAL #3   Title  Patient will ambulate with RW, SBA, on level surfaces for >150 feet with no LOB and no sitting breaks in order to demonstrate increased independence and improved mobility.    Baseline  (10/26/2017) <25 feet    Time  3    Period  Weeks    Status  New    Target Date  11/17/17        PT Long Term Goals - 11/02/17 1315      PT LONG TERM GOAL #1   Title  Patient will increase Berg Balance score to 46/56 to demonstrate decreased fall risk during functional activities and maintain safety.    Baseline  (10/26/2017) 23/56    Time  8    Period  Weeks    Status  New    Target Date  12/22/17      PT LONG TERM GOAL #2   Title  Patient will complete five times sit to stand test in < 15 seconds (2 MCIDs) indicating increased LE strength and improved balance in order to improve mobility and decrease risk of falls.    Baseline  (10/26/2017) 29 sec    Time  12    Period  Weeks    Status  New    Target Date  01/19/18      PT LONG TERM GOAL #3   Title  Patient will be independent in bending down towards floor and picking up small object (<5 pounds) and then stand back up without loss of balance in order to return to PLOF and meet demands at work.    Baseline  (10/26/2017) not assessed 2/2 BLT precautions    Time  12    Period  Weeks    Status  New    Target Date  01/19/18      PT LONG TERM GOAL #4   Title  Patient will reduce timed up and go to <11 seconds to reduce fall risk and demonstrate improved transfer/gait ability.    Baseline  10/26/2017: not assessed 2/2 to fatigue/endurance; 11/02/17: 43 seconds    Time  12    Period  Weeks    Status  New    Target  Date  01/19/17            Plan - 11/14/17 1442    Clinical Impression Statement  Patient continues to demonstrate excellent motivation during session. She has more truncal instability without her TLSO brace so worked on some isometric core strengthening. LE fatigue noted during gait with intermittent buckling. Pt encouraged to continue HEP. Deficits remain in strength, activity tolerance, motor control, and balance which will continue to benefit from skilled therapeutic intervention.     Rehab Potential  Good    PT Frequency  2x / week    PT Duration  12 weeks    PT Treatment/Interventions  Cryotherapy;Moist Heat;Ultrasound;Gait training;Stair training;Functional mobility training;Therapeutic activities;Therapeutic exercise;Balance training;Neuromuscular re-education;Patient/family education;Orthotic Fit/Training;Manual techniques;Scar mobilization;Dry needling;Energy conservation;Electrical Stimulation    PT Next Visit Plan  B LE strengthening, balance    PT Home Exercise Plan  glute sets, LAQ, sit-to-stand with supervision and RW, standing hip aBduction (HEP provided at hospital d/c)    Consulted and Agree with Plan of Care  Patient;Family member/caregiver    Family Member Consulted  Spouse       Patient will benefit from skilled therapeutic intervention in order to improve the following deficits and impairments:  Abnormal gait, Decreased endurance, Impaired sensation, Decreased activity tolerance, Decreased strength, Pain, Decreased balance, Decreased mobility, Difficulty walking, Improper body mechanics, Decreased range of motion, Decreased coordination, Postural dysfunction  Visit Diagnosis: Difficulty in walking, not elsewhere classified  Muscle weakness (generalized)     Problem List Patient Active Problem List   Diagnosis Date Noted  . Allergic state 03/14/2017  . Arthritis 03/14/2017  . Hypertension 03/14/2017  . Osteoporosis, post-menopausal 03/14/2017  . Benign  paroxysmal positional vertigo due to bilateral vestibular disorder 06/30/2015   Phillips Grout PT, DPT, GCS  Karissa Meenan 11/14/2017, 4:15 PM  [place] MAIN [facility] SERVICES [address] [place], [place], [address] Phone: [phone]   Fax:  [phone]  Name: JAQUANNA BALLENTINE MRN: [id number] Date of Birth: 1945-08-25

## 2017-11-16 ENCOUNTER — Ambulatory Visit: Payer: BLUE CROSS/BLUE SHIELD

## 2017-11-16 DIAGNOSIS — R262 Difficulty in walking, not elsewhere classified: Secondary | ICD-10-CM | POA: Diagnosis not present

## 2017-11-16 DIAGNOSIS — M6281 Muscle weakness (generalized): Secondary | ICD-10-CM

## 2017-11-16 NOTE — Therapy (Signed)
[place] MAIN [facility] SERVICES [address] [place], [place], [address] Phone: [phone]   Fax:  [phone]  Physical Therapy Treatment  Patient Details  Name: Jaclyn Hall MRN: [id number] Date of Birth: July 14, 1945 Referring Provider (PT): Merrilee Seashore, NP   Encounter Date: 11/16/2017  PT End of Session - 11/16/17 1436    Visit Number  7    Number of Visits  25    Date for PT Re-Evaluation  01/19/18    Authorization Type  6/10 progress note (10/27/2017)    PT Start Time  1432    PT Stop Time  1515    PT Time Calculation (min)  43 min    Equipment Utilized During Treatment  Gait belt    Activity Tolerance  Patient tolerated treatment well;Patient limited by fatigue;No increased pain    Behavior During Therapy  WFL for tasks assessed/performed       Past Medical History:  Diagnosis Date  . Arthritis   . Asthma   . Colon polyps   . Hypertension   . Osteoporosis   . Vertigo     Past Surgical History:  Procedure Laterality Date  . ABDOMINAL HYSTERECTOMY    . CHOLECYSTECTOMY    . COLONOSCOPY    . COLONOSCOPY N/A 11/22/2014   Procedure: COLONOSCOPY;  Surgeon: Lollie Sails, MD;  Location: [facility] ENDOSCOPY;  Service: Endoscopy;  Laterality: N/A;  . OOPHORECTOMY      There were no vitals filed for this visit.  Subjective Assessment - 11/16/17 1435    Subjective  Patient states she is doing well today with the exception of some stiffness in her legs. Denies pain today. Husband comes with patient today so he can observe session and be able to help pt with her HEP.    Patient is accompained by:  Family member   Jaclyn Hall    Pertinent History  Patient is 72 year old female with diagnosis of spinal cord compression at T4/5 s/p laminectomy with decreased mobility and function. PMH significant for hypertension, osteoporosis, arthritis, and BPPV.     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  No/denies          TREATMENT   Therapeutic Exercise: Nu-step L2-4 x 4 minutes for warm-up during history (2 minutes unbilled); Gait in gym and out in hallway x 250' with rolling walker, fatigue monitored with increased foot drag noted toward end of ambulation and pt reporting fatigue; Sit<>stand from mat table without UE support x 10, added 4# medicine ball x 10; Hooklying isometric manually resisted trunk rotation 3s hold x 10 each direction; Hooklying SLR 2 x 10 bilateral; Hooklying bridges 2 x 10; Sidelying SLR hip abduction x 10 bilateral; Standing balance on Airex with balloon taps x 3 minutes;   Pt educated throughout session about proper posture and technique with exercises. Improved exercise technique, movement at target joints, use of target muscles after min to mod verbal, visual, tactile cues.    Patient continues to demonstrate excellent motivation during session. Progressed ambulation distance with patient and she demonstrates some fatigue at the end of the distance. Practiced standing stability on Airex pad with balloon taps and pt demonstrates notable instability and fatigue. Pt encouraged to continue HEP and husband provided  instructions about different variations of exercises she can perform. Deficits remain in strength, activity tolerance, motor control, and balance which will continue to benefit from skilled therapeutic intervention.                        PT Short Term Goals - 10/27/17 0956      PT SHORT TERM GOAL #1   Title  Patient will be adherent to HEP at least 3x a week to improve functional strength in BLE and balance in order to decrease risk of falls and maintain safety at home.    Baseline  Not inititated (10/26/2017)    Time  3    Period  Weeks    Status  New     Target Date  11/17/17      PT SHORT TERM GOAL #2   Title  Patient will complete five times sit to stand test in < 24 seconds (2 MCIDs) indicating increased LE strength and improved balance in order to improve mobility and decrease risk of falls.    Baseline  (10/26/2017) 29 sec    Time  3    Period  Weeks    Status  New    Target Date  11/17/17      PT SHORT TERM GOAL #3   Title  Patient will ambulate with RW, SBA, on level surfaces for >150 feet with no LOB and no sitting breaks in order to demonstrate increased independence and improved mobility.    Baseline  (10/26/2017) <25 feet    Time  3    Period  Weeks    Status  New    Target Date  11/17/17        PT Long Term Goals - 11/02/17 1315      PT LONG TERM GOAL #1   Title  Patient will increase Berg Balance score to 46/56 to demonstrate decreased fall risk during functional activities and maintain safety.    Baseline  (10/26/2017) 23/56    Time  8    Period  Weeks    Status  New    Target Date  12/22/17      PT LONG TERM GOAL #2   Title  Patient will complete five times sit to stand test in < 15 seconds (2 MCIDs) indicating increased LE strength and improved balance in order to improve mobility and decrease risk of falls.    Baseline  (10/26/2017) 29 sec    Time  12    Period  Weeks    Status  New    Target Date  01/19/18      PT LONG TERM GOAL #3   Title  Patient will be independent in bending down towards floor and picking up small object (<5 pounds) and then stand back up without loss of balance in order to return to PLOF and meet demands at work.    Baseline  (10/26/2017) not assessed 2/2 BLT precautions    Time  12    Period  Weeks    Status  New    Target Date  01/19/18      PT LONG TERM GOAL #4   Title  Patient will reduce timed up and go to <11 seconds to reduce fall risk and demonstrate improved transfer/gait ability.    Baseline  10/26/2017: not assessed 2/2 to fatigue/endurance; 11/02/17: 43 seconds    Time   12    Period  Weeks    Status  New    Target Date  01/19/17            Plan - 11/16/17 1437    Clinical Impression Statement  Patient continues to demonstrate excellent motivation during session. Progressed ambulation distance with patient and she demonstrates some fatigue at the end of the distance. Practiced standing stability on Airex pad with balloon taps and pt demonstrates notable instability and fatigue. Pt encouraged to continue HEP and husband provided instructions about different variations of exercises she can perform. Deficits remain in strength, activity tolerance, motor control, and balance which will continue to benefit from skilled therapeutic intervention.     Rehab Potential  Good    PT Frequency  2x / week    PT Duration  12 weeks    PT Treatment/Interventions  Cryotherapy;Moist Heat;Ultrasound;Gait training;Stair training;Functional mobility training;Therapeutic activities;Therapeutic exercise;Balance training;Neuromuscular re-education;Patient/family education;Orthotic Fit/Training;Manual techniques;Scar mobilization;Dry needling;Energy conservation;Electrical Stimulation    PT Next Visit Plan  B LE strengthening, balance    PT Home Exercise Plan  glute sets, LAQ, sit-to-stand with supervision and RW, standing hip aBduction (HEP provided at hospital d/c)    Consulted and Agree with Plan of Care  Patient;Family member/caregiver    Family Member Consulted  Spouse       Patient will benefit from skilled therapeutic intervention in order to improve the following deficits and impairments:  Abnormal gait, Decreased endurance, Impaired sensation, Decreased activity tolerance, Decreased strength, Pain, Decreased balance, Decreased mobility, Difficulty walking, Improper body mechanics, Decreased range of motion, Decreased coordination, Postural dysfunction  Visit Diagnosis: Difficulty in walking, not elsewhere classified  Muscle weakness (generalized)     Problem  List Patient Active Problem List   Diagnosis Date Noted  . Allergic state 03/14/2017  . Arthritis 03/14/2017  . Hypertension 03/14/2017  . Osteoporosis, post-menopausal 03/14/2017  . Benign paroxysmal positional vertigo due to bilateral vestibular disorder 06/30/2015   Phillips Grout PT, DPT, GCS  Dionicia Cerritos 11/16/2017, 3:49 PM  [place] MAIN [facility] SERVICES [address] [place], [place], [address] Phone: [phone]   Fax:  [phone]  Name: JACKLYNE BAIK MRN: [id number] Date of Birth: 1945/04/10

## 2017-11-21 ENCOUNTER — Ambulatory Visit: Payer: BLUE CROSS/BLUE SHIELD | Attending: Nurse Practitioner

## 2017-11-21 DIAGNOSIS — M6281 Muscle weakness (generalized): Secondary | ICD-10-CM | POA: Diagnosis present

## 2017-11-21 DIAGNOSIS — R262 Difficulty in walking, not elsewhere classified: Secondary | ICD-10-CM | POA: Diagnosis not present

## 2017-11-21 NOTE — Therapy (Signed)
[place] MAIN [facility] SERVICES [address] [place], [place], [address] Phone: [phone]   Fax:  [phone]  Physical Therapy Treatment  Patient Details  Name: Jaclyn Hall MRN: [id number] Date of Birth: 12-Jun-1945 Referring Provider (PT): Merrilee Seashore, NP   Encounter Date: 11/21/2017  PT End of Session - 11/22/17 0856    Visit Number  8    Number of Visits  25    Date for PT Re-Evaluation  01/19/18    Authorization Type  7/10 progress note (10/27/2017)    PT Start Time  1440    PT Stop Time  1515    PT Time Calculation (min)  35 min    Equipment Utilized During Treatment  Gait belt    Activity Tolerance  Patient tolerated treatment well;Patient limited by fatigue;No increased pain    Behavior During Therapy  WFL for tasks assessed/performed       Past Medical History:  Diagnosis Date  . Arthritis   . Asthma   . Colon polyps   . Hypertension   . Osteoporosis   . Vertigo     Past Surgical History:  Procedure Laterality Date  . ABDOMINAL HYSTERECTOMY    . CHOLECYSTECTOMY    . COLONOSCOPY    . COLONOSCOPY N/A 11/22/2014   Procedure: COLONOSCOPY;  Surgeon: Lollie Sails, MD;  Location: [facility] ENDOSCOPY;  Service: Endoscopy;  Laterality: N/A;  . OOPHORECTOMY      There were no vitals filed for this visit.  Subjective Assessment - 11/22/17 0854    Subjective  Patient states she is doing well today. Denies pain. No specific questions or concerns.    Patient is accompained by:  Family member   [name]    Pertinent History  Patient is 72 year old female with diagnosis of spinal cord compression at T4/5 s/p laminectomy with decreased mobility and function. PMH significant for hypertension, osteoporosis, arthritis, and BPPV.     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  No/denies         TREATMENT   Therapeutic Exercise: Nu-stepintervals L6 x 30s, L3 x 1 minute x 5 minutes total with 1 minute cool down, fatigue monitored and history obtained during warm-up and cool down; Gait in gym and out in hallway x 250' with rolling walker, fatigue monitoredwith increased foot drag noted toward end of ambulation and pt reporting fatigue; Sit<>standfrom mat table without UE support x 10, added 4# medicine ball x 10; Seated marches 2 x 10; Seated clams with manual resistance 2 x 10; Standing hip abduction in // bars with BUE support x 10 bilateral; Standing mini squats x 10; Standing heel raises x 15; Side stepping in // bars 5' x 6 lengths;   Pt educated throughout session about proper posture and technique with exercises. Improved exercise technique, movement at target joints, use of target muscles after min to mod verbal, visual, tactile cues.   Patient continues todemonstrate excellent motivation during session. Increased repetitions for sit to stand with weighted medicine ball. Advanced strengthening exercise to include more in standing. Pt encouraged to continue HEP and follow-up as scheduled. Pt presents withdeficits in strength, activity tolerance, motor control, and balance which will continue to benefit from skilled therapeutic intervention.  PT Short Term Goals - 10/27/17 0956      PT SHORT TERM GOAL #1   Title  Patient will be adherent to HEP at least 3x a week to improve functional strength in BLE and balance in order to decrease risk of falls and maintain safety at home.    Baseline  Not inititated (10/26/2017)    Time  3    Period  Weeks    Status  New    Target Date  11/17/17      PT SHORT TERM GOAL #2   Title  Patient will complete five times sit to stand test in < 24 seconds (2 MCIDs) indicating increased LE strength and  improved balance in order to improve mobility and decrease risk of falls.    Baseline  (10/26/2017) 29 sec    Time  3    Period  Weeks    Status  New    Target Date  11/17/17      PT SHORT TERM GOAL #3   Title  Patient will ambulate with RW, SBA, on level surfaces for >150 feet with no LOB and no sitting breaks in order to demonstrate increased independence and improved mobility.    Baseline  (10/26/2017) <25 feet    Time  3    Period  Weeks    Status  New    Target Date  11/17/17        PT Long Term Goals - 11/02/17 1315      PT LONG TERM GOAL #1   Title  Patient will increase Berg Balance score to 46/56 to demonstrate decreased fall risk during functional activities and maintain safety.    Baseline  (10/26/2017) 23/56    Time  8    Period  Weeks    Status  New    Target Date  12/22/17      PT LONG TERM GOAL #2   Title  Patient will complete five times sit to stand test in < 15 seconds (2 MCIDs) indicating increased LE strength and improved balance in order to improve mobility and decrease risk of falls.    Baseline  (10/26/2017) 29 sec    Time  12    Period  Weeks    Status  New    Target Date  01/19/18      PT LONG TERM GOAL #3   Title  Patient will be independent in bending down towards floor and picking up small object (<5 pounds) and then stand back up without loss of balance in order to return to PLOF and meet demands at work.    Baseline  (10/26/2017) not assessed 2/2 BLT precautions    Time  12    Period  Weeks    Status  New    Target Date  01/19/18      PT LONG TERM GOAL #4   Title  Patient will reduce timed up and go to <11 seconds to reduce fall risk and demonstrate improved transfer/gait ability.    Baseline  10/26/2017: not assessed 2/2 to fatigue/endurance; 11/02/17: 43 seconds    Time  12    Period  Weeks    Status  New    Target Date  01/19/17            Plan - 11/22/17 0857    Clinical Impression Statement  Patient continues todemonstrate  excellent motivation during session. Increased repetitions for sit to stand with weighted medicine ball. Advanced strengthening exercise to  include more in standing. Pt encouraged to continue HEP and follow-up as scheduled. Pt presents withdeficits in strength, activity tolerance, motor control, and balance which will continue to benefit from skilled therapeutic intervention.    Rehab Potential  Good    PT Frequency  2x / week    PT Duration  12 weeks    PT Treatment/Interventions  Cryotherapy;Moist Heat;Ultrasound;Gait training;Stair training;Functional mobility training;Therapeutic activities;Therapeutic exercise;Balance training;Neuromuscular re-education;Patient/family education;Orthotic Fit/Training;Manual techniques;Scar mobilization;Dry needling;Energy conservation;Electrical Stimulation    PT Next Visit Plan  BLE strengthening, balance    PT Home Exercise Plan  glute sets, LAQ, sit-to-stand with supervision and RW, standing hip aBduction (HEP provided at hospital d/c)    Consulted and Agree with Plan of Care  Patient;Family member/caregiver    Family Member Consulted  Spouse       Patient will benefit from skilled therapeutic intervention in order to improve the following deficits and impairments:  Abnormal gait, Decreased endurance, Impaired sensation, Decreased activity tolerance, Decreased strength, Pain, Decreased balance, Decreased mobility, Difficulty walking, Improper body mechanics, Decreased range of motion, Decreased coordination, Postural dysfunction  Visit Diagnosis: Difficulty in walking, not elsewhere classified  Muscle weakness (generalized)     Problem List Patient Active Problem List   Diagnosis Date Noted  . Allergic state 03/14/2017  . Arthritis 03/14/2017  . Hypertension 03/14/2017  . Osteoporosis, post-menopausal 03/14/2017  . Benign paroxysmal positional vertigo due to bilateral vestibular disorder 06/30/2015   Phillips Grout PT, DPT, GCS   Telia Amundson 11/22/2017, 9:40 AM  [place] MAIN [facility] SERVICES [address] [place], [place], [address] Phone: [phone]   Fax:  [phone]  Name: Jaclyn Hall MRN: [id number] Date of Birth: October 16, 1945

## 2017-11-23 ENCOUNTER — Ambulatory Visit: Payer: BLUE CROSS/BLUE SHIELD

## 2017-11-23 DIAGNOSIS — R262 Difficulty in walking, not elsewhere classified: Secondary | ICD-10-CM

## 2017-11-23 DIAGNOSIS — M6281 Muscle weakness (generalized): Secondary | ICD-10-CM

## 2017-11-23 NOTE — Therapy (Signed)
[place] MAIN [facility] SERVICES [address] [place], [place], [address] Phone: [phone]   Fax:  [phone]  Physical Therapy Treatment  Patient Details  Name: Jaclyn Hall MRN: [id number] Date of Birth: August 31, 1945 Referring Provider (PT): Merrilee Seashore, NP   Encounter Date: 11/23/2017  PT End of Session - 11/23/17 1631    Visit Number  9    Number of Visits  25    Date for PT Re-Evaluation  01/19/18    Authorization Type  8/10 progress note (10/27/2017)    PT Start Time  1445    PT Stop Time  1515    PT Time Calculation (min)  30 min    Equipment Utilized During Treatment  Gait belt    Activity Tolerance  Patient tolerated treatment well;No increased pain    Behavior During Therapy  WFL for tasks assessed/performed       Past Medical History:  Diagnosis Date  . Arthritis   . Asthma   . Colon polyps   . Hypertension   . Osteoporosis   . Vertigo     Past Surgical History:  Procedure Laterality Date  . ABDOMINAL HYSTERECTOMY    . CHOLECYSTECTOMY    . COLONOSCOPY    . COLONOSCOPY N/A 11/22/2014   Procedure: COLONOSCOPY;  Surgeon: Lollie Sails, MD;  Location: [facility] ENDOSCOPY;  Service: Endoscopy;  Laterality: N/A;  . OOPHORECTOMY      There were no vitals filed for this visit.  Subjective Assessment - 11/23/17 1459    Subjective  Patient states she is doing well today. Denies pain but complains of bilateral knee stiffness with the colder weather. No specific questions or concerns.    Patient is accompained by:  Family member   [name]    Pertinent History  Patient is 72 year old female with diagnosis of spinal cord compression at T4/5 s/p laminectomy with decreased mobility and function. PMH significant for hypertension, osteoporosis, arthritis, and BPPV.     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  No/denies          TREATMENT   Therapeutic Exercise: Nu-stepintervals L4-5 x 30s, L2-3 x 1 minute x 5 minutes total with 1 minute cool down, fatigue monitored and history obtained during warm-up and cool down; Gait in gymand out in hallwayx250'with rolling walker, fatigue monitored, cues for horizontal and vertical head turnswith increased foot drag noted toward end of ambulation and pt reporting fatigue; Quantum leg press 75# x 15, 90# x 15, assist required to prevent bilateral knee hyperextension;  Sit<>standfrom mat table with 10# medicine ball 2 x 10; Seated marches 2 x 10; Seated clams with manual resistance 2 x 10;   Pt educated throughout session about proper posture and technique with exercises. Improved exercise technique, movement at target joints, use of target muscles after min to mod verbal, visual, tactile cues.   Patient continues todemonstrate excellent motivation during session.Increased weight with sit to stand from mat table. Advanced strengthening to include leg press. Pt encouraged to continue HEPand follow-up as scheduled. Pt presents withdeficits in strength, activity tolerance, motor control, and balance which will continue to benefit from skilled therapeutic intervention.  PT Short Term Goals - 10/27/17 0956      PT SHORT TERM GOAL #1   Title  Patient will be adherent to HEP at least 3x a week to improve functional strength in BLE and balance in order to decrease risk of falls and maintain safety at home.    Baseline  Not inititated (10/26/2017)    Time  3    Period  Weeks    Status  New    Target Date  11/17/17      PT SHORT TERM GOAL #2   Title  Patient will complete five times sit to stand test in < 24 seconds (2 MCIDs) indicating increased LE strength and improved balance in order to improve mobility  and decrease risk of falls.    Baseline  (10/26/2017) 29 sec    Time  3    Period  Weeks    Status  New    Target Date  11/17/17      PT SHORT TERM GOAL #3   Title  Patient will ambulate with RW, SBA, on level surfaces for >150 feet with no LOB and no sitting breaks in order to demonstrate increased independence and improved mobility.    Baseline  (10/26/2017) <25 feet    Time  3    Period  Weeks    Status  New    Target Date  11/17/17        PT Long Term Goals - 11/02/17 1315      PT LONG TERM GOAL #1   Title  Patient will increase Berg Balance score to 46/56 to demonstrate decreased fall risk during functional activities and maintain safety.    Baseline  (10/26/2017) 23/56    Time  8    Period  Weeks    Status  New    Target Date  12/22/17      PT LONG TERM GOAL #2   Title  Patient will complete five times sit to stand test in < 15 seconds (2 MCIDs) indicating increased LE strength and improved balance in order to improve mobility and decrease risk of falls.    Baseline  (10/26/2017) 29 sec    Time  12    Period  Weeks    Status  New    Target Date  01/19/18      PT LONG TERM GOAL #3   Title  Patient will be independent in bending down towards floor and picking up small object (<5 pounds) and then stand back up without loss of balance in order to return to PLOF and meet demands at work.    Baseline  (10/26/2017) not assessed 2/2 BLT precautions    Time  12    Period  Weeks    Status  New    Target Date  01/19/18      PT LONG TERM GOAL #4   Title  Patient will reduce timed up and go to <11 seconds to reduce fall risk and demonstrate improved transfer/gait ability.    Baseline  10/26/2017: not assessed 2/2 to fatigue/endurance; 11/02/17: 43 seconds    Time  12    Period  Weeks    Status  New    Target Date  01/19/17            Plan - 11/23/17 1632    Clinical Impression Statement  Patient continues todemonstrate excellent motivation during session.Increased  weight with sit to stand from mat table. Advanced strengthening to include leg press.  Pt encouraged to continue HEPand follow-up as scheduled. Pt presents withdeficits in strength, activity tolerance, motor control, and balance which will continue to benefit from skilled therapeutic intervention.    Rehab Potential  Good    PT Frequency  2x / week    PT Duration  12 weeks    PT Treatment/Interventions  Cryotherapy;Moist Heat;Ultrasound;Gait training;Stair training;Functional mobility training;Therapeutic activities;Therapeutic exercise;Balance training;Neuromuscular re-education;Patient/family education;Orthotic Fit/Training;Manual techniques;Scar mobilization;Dry needling;Energy conservation;Electrical Stimulation    PT Next Visit Plan  BLE strengthening, balance    PT Home Exercise Plan  glute sets, LAQ, sit-to-stand with supervision and RW, standing hip aBduction (HEP provided at hospital d/c)    Consulted and Agree with Plan of Care  Patient;Family member/caregiver    Family Member Consulted  Spouse       Patient will benefit from skilled therapeutic intervention in order to improve the following deficits and impairments:  Abnormal gait, Decreased endurance, Impaired sensation, Decreased activity tolerance, Decreased strength, Pain, Decreased balance, Decreased mobility, Difficulty walking, Improper body mechanics, Decreased range of motion, Decreased coordination, Postural dysfunction  Visit Diagnosis: Difficulty in walking, not elsewhere classified  Muscle weakness (generalized)     Problem List Patient Active Problem List   Diagnosis Date Noted  . Allergic state 03/14/2017  . Arthritis 03/14/2017  . Hypertension 03/14/2017  . Osteoporosis, post-menopausal 03/14/2017  . Benign paroxysmal positional vertigo due to bilateral vestibular disorder 06/30/2015   Phillips Grout PT, DPT, GCS  Stuart Guillen 11/23/2017, 4:36 PM  [place] MAIN  [facility] SERVICES [address] [place], [place], [address] Phone: [phone]   Fax:  [phone]  Name: Jaclyn Hall MRN: [id number] Date of Birth: [birth year]

## 2017-11-25 ENCOUNTER — Ambulatory Visit: Payer: Self-pay

## 2017-11-25 DIAGNOSIS — Z23 Encounter for immunization: Secondary | ICD-10-CM

## 2017-11-28 ENCOUNTER — Ambulatory Visit: Payer: BLUE CROSS/BLUE SHIELD | Admitting: Physical Therapy

## 2017-11-28 DIAGNOSIS — R262 Difficulty in walking, not elsewhere classified: Secondary | ICD-10-CM | POA: Diagnosis not present

## 2017-11-28 DIAGNOSIS — M6281 Muscle weakness (generalized): Secondary | ICD-10-CM

## 2017-11-29 NOTE — Therapy (Signed)
[place] MAIN [facility] SERVICES [address] [place], [place], [address] Phone: [phone]   Fax:  [phone]  Physical Therapy Treatment Physical Therapy Progress Note   Dates of reporting period  10/26/2017  to   11/28/2017   Patient Details  Name: Jaclyn Hall MRN: [id number] Date of Birth: [birth year] Referring Provider (PT): Merrilee Seashore, NP   Encounter Date: 11/28/2017  PT End of Session - 11/28/17 1448    Visit Number  10    Number of Visits  25    Date for PT Re-Evaluation  01/19/18    Authorization Type  1/10 progress note (11/28/2017)    PT Start Time  1287    PT Stop Time  1529    PT Time Calculation (min)  40 min    Equipment Utilized During Treatment  Gait belt    Activity Tolerance  Patient tolerated treatment well;No increased pain    Behavior During Therapy  WFL for tasks assessed/performed       Past Medical History:  Diagnosis Date  . Arthritis   . Asthma   . Colon polyps   . Hypertension   . Osteoporosis   . Vertigo     Past Surgical History:  Procedure Laterality Date  . ABDOMINAL HYSTERECTOMY    . CHOLECYSTECTOMY    . COLONOSCOPY    . COLONOSCOPY N/A 11/22/2014   Procedure: COLONOSCOPY;  Surgeon: Lollie Sails, MD;  Location: [facility] ENDOSCOPY;  Service: Endoscopy;  Laterality: N/A;  . OOPHORECTOMY      There were no vitals filed for this visit.  Subjective Assessment - 11/28/17 1432    Subjective  Patient reports she is very stiff in her knees today, but otherwise she is doing well. She reports no pain and denies any falls.    Patient is accompained by:  Family member   [name]    Pertinent History  Patient is 72 year old female with diagnosis of spinal cord compression at T4/5 s/p laminectomy with decreased mobility and function. PMH significant for hypertension, osteoporosis, arthritis, and BPPV.     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  No/denies       TREATMENT  Therapeutic Exercise: Sit <>stand x10 regular height chair TUG: 25.1 seconds (Patient continues to require SBA during sit<>stand when combined with walking to prevent LOB) 5TSTS: 26.8 sec   Patient educated on the importance of slow, controlled movement to prevent LOB and improve overall quality of movement.  Neuromuscular Re-education: Gait in an open environment w/ dual task, x375 feet, no LOB, RW, SBA. Patient did require considerable seated rest break after continuous walking before the leg "heaviness" subsided. Standing and reaching forward x5 Standing and picking up objects from the floor x5 Berg Balance Scale: 39/56  Treatments unbilled: Nu-step, L4, x 5 min (during hx)      PT Short Term Goals - 11/28/17 1449      PT SHORT TERM GOAL #1   Title  Patient will be adherent to HEP at least 3x a week to improve functional strength in BLE and balance in order to decrease risk of falls and maintain safety at home.    Baseline  Walking supported, sit <>stands performed most days per patient reports  Time  3    Period  Weeks    Status  Partially Met    Target Date  11/17/17      PT SHORT TERM GOAL #2   Title  Patient will complete five times sit to stand test in < 24 seconds (2 MCIDs) indicating increased LE strength and improved balance in order to improve mobility and decrease risk of falls.    Baseline  (10/26/2017) 29 sec; (11/28/2017) 26.8 sec    Time  3    Period  Weeks    Status  Partially Met    Target Date  11/17/17      PT SHORT TERM GOAL #3   Title  Patient will ambulate with RW, SBA, on level surfaces for >150 feet with no LOB and no sitting breaks in order to demonstrate increased independence and improved mobility.    Baseline  (10/26/2017) <25 feet, (11/28/2017),  375 ft    Time  3    Period  Weeks    Status  Achieved    Target Date  11/17/17        PT Long Term Goals - 11/28/17 1515      PT LONG TERM GOAL #1   Title  Patient will increase Berg Balance score to 46/56 to demonstrate decreased fall risk during functional activities and maintain safety.    Baseline  (10/26/2017) 23/56; 11/28/2017: 39/56    Time  8    Period  Weeks    Status  On-going    Target Date  12/22/17      PT LONG TERM GOAL #2   Title  Patient will complete five times sit to stand test in < 15 seconds (2 MCIDs) indicating increased LE strength and improved balance in order to improve mobility and decrease risk of falls.    Baseline  (10/26/2017) 29 sec; (11/28/2017) 26.8 sec    Time  12    Period  Weeks    Status  On-going    Target Date  01/19/18      PT LONG TERM GOAL #3   Title  Patient will be independent in bending down towards floor and picking up small object (<5 pounds) and then stand back up without loss of balance in order to return to PLOF and meet demands at work.    Baseline  (10/26/2017) not assessed 2/2 BLT precautions; 11/28/2017: able to pick up a cone w/o UE support, no loss of balance    Time  12    Period  Weeks    Status  Achieved    Target Date  01/19/18      PT LONG TERM GOAL #4   Title  Patient will reduce timed up and go to <11 seconds to reduce fall risk and demonstrate improved transfer/gait ability.    Baseline  10/26/2017: not assessed 2/2 to fatigue/endurance; 11/02/17: 43 seconds; 11/28/2017: 25.1 seconds (RW)    Time  12    Period  Weeks    Status  Partially Met    Target Date  01/19/18            Plan - 11/28/17 1445    Clinical Impression Statement Patient presents to clinic with increased stiffness in BLE knees but is amenable to therapy. Patient demonstrates progress in activity tolerance, balance, and strength as evidenced by her ability to walk 375 feet with a RW, SBA, continuously without any sitting breaks; an improved  Berg Balance score of 39/56, and no knee buckling  during sit<>stand or gait activities. patient continues to have deficits in balance, strength, mobility, activity tolerance, and motor control which will continue to benefit from skilled therapeutic intervention. Patient's condition has the potential to improve in response to therapy. Maximum improvement is yet to be obtained. The anticipated improvement is attainable and reasonable in a generally predictable time.   Rehab Potential  Good    PT Frequency  2x / week    PT Duration  12 weeks    PT Treatment/Interventions  Cryotherapy;Moist Heat;Ultrasound;Gait training;Stair training;Functional mobility training;Therapeutic activities;Therapeutic exercise;Balance training;Neuromuscular re-education;Patient/family education;Orthotic Fit/Training;Manual techniques;Scar mobilization;Dry needling;Energy conservation;Electrical Stimulation    PT Next Visit Plan  BLE strengthening, balance    PT Home Exercise Plan  glute sets, LAQ, sit-to-stand with supervision and RW, standing hip aBduction (HEP provided at hospital d/c)    Consulted and Agree with Plan of Care  Patient;Family member/caregiver    Family Member Consulted  Spouse       Patient will benefit from skilled therapeutic intervention in order to improve the following deficits and impairments:  Abnormal gait, Decreased endurance, Impaired sensation, Decreased activity tolerance, Decreased strength, Pain, Decreased balance, Decreased mobility, Difficulty walking, Improper body mechanics, Decreased range of motion, Decreased coordination, Postural dysfunction  Visit Diagnosis: Difficulty in walking, not elsewhere classified  Muscle weakness (generalized)     Problem List Patient Active Problem List   Diagnosis Date Noted  . Allergic state 03/14/2017  . Arthritis 03/14/2017  . Hypertension 03/14/2017  . Osteoporosis, post-menopausal 03/14/2017  . Benign paroxysmal positional vertigo due to  bilateral vestibular disorder 06/30/2015   Myles Gip PT, DPT [phone] 11/29/2017, 10:49 AM  [place] MAIN [facility] SERVICES [address] [place], [place], [address] Phone: [phone]   Fax:  [phone]  Name: MEGHAM DWYER MRN: [id number] Date of Birth: Apr 15, 1945

## 2017-11-30 ENCOUNTER — Ambulatory Visit: Payer: BLUE CROSS/BLUE SHIELD

## 2017-11-30 DIAGNOSIS — R262 Difficulty in walking, not elsewhere classified: Secondary | ICD-10-CM | POA: Diagnosis not present

## 2017-11-30 DIAGNOSIS — M6281 Muscle weakness (generalized): Secondary | ICD-10-CM

## 2017-11-30 NOTE — Therapy (Signed)
[place] MAIN [facility] SERVICES [address] [facility], [place], [address] Phone: [phone]   Fax:  [phone]  Physical Therapy Treatment  Patient Details  Name: Jaclyn Hall MRN: [id number] Date of Birth: [birth year] Referring Provider (PT): Merrilee Seashore, NP   Encounter Date: 11/30/2017  PT End of Session - 11/30/17 1602    Visit Number  11    Number of Visits  25    Date for PT Re-Evaluation  01/19/18    Authorization Type  2/10 progress note (11/28/2017)    PT Start Time  0352    PT Stop Time  1530    PT Time Calculation (min)  45 min    Equipment Utilized During Treatment  Gait belt    Activity Tolerance  Patient tolerated treatment well;No increased pain    Behavior During Therapy  WFL for tasks assessed/performed       Past Medical History:  Diagnosis Date  . Arthritis   . Asthma   . Colon polyps   . Hypertension   . Osteoporosis   . Vertigo     Past Surgical History:  Procedure Laterality Date  . ABDOMINAL HYSTERECTOMY    . CHOLECYSTECTOMY    . COLONOSCOPY    . COLONOSCOPY N/A 11/22/2014   Procedure: COLONOSCOPY;  Surgeon: Lollie Sails, MD;  Location: [facility] ENDOSCOPY;  Service: Endoscopy;  Laterality: N/A;  . OOPHORECTOMY      There were no vitals filed for this visit.  Subjective Assessment - 11/30/17 1449    Subjective  Patient reports she is very stiff in her knees today again. Otherewise she is doing well. She reports no pain and denies any falls. She reports some persistent L knee swelling since being in the hospital.    Patient is accompained by:  Family member   [name]    Pertinent History  Patient is 72 year old female with diagnosis of spinal cord compression at T4/5 s/p laminectomy with decreased mobility and function. PMH significant for hypertension, osteoporosis, arthritis, and BPPV.     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  No/denies        TREATMENT   Therapeutic Exercise: Nu-stepintervals L6 x 30s, L3 x 1 minute x 5 minutes total with 1 minute warm-up and 1 minute cool down, fatigue monitored and history obtained during warm-up and cool down; Gait in gymand out in [id number]'with rolling walker, fatigue monitored, cues for horizontal and vertical head turns as well as gait speed changes. Increased foot drag noted toward end of ambulation and pt reporting fatigue. Intermittent LE buckling but pt able to self-correct; Quantum leg press 105# x 20, 120# x 20 , assist required to prevent bilateral knee hyperextension as well; Seated marches2 x 20; Seated clams with manual resistance 2 x 20; Seated adductor squeezes with manual resistance 2 x 20; Standing mini squats x 10; Step-ups to 4" step alternating LE x 10 each; Standing hip abduction x 10 bilateral;   Pt educated throughout session about proper posture and technique with exercises. Improved exercise technique, movement at target joints, use of target muscles after min to mod verbal, visual, tactile cues.    Patient continues todemonstrate excellent motivation during session.Continued to progress to more standing exercises today. Increased  resistance with leg press. Will continue to advance strengthening and balance exercises in future session. Pt encouraged to continue HEPandfollow-up as scheduled.Pt presents withdeficits in strength, activity tolerance, motor control, and balance which will continue to benefit from skilled therapeutic intervention.                        PT Short Term Goals - 11/28/17 1449      PT SHORT TERM GOAL #1   Title  Patient will be adherent to HEP at least 3x a week to improve functional strength in BLE and balance in order to decrease risk  of falls and maintain safety at home.    Baseline  Walking supported, sit <>stands performed most days per patient reports    Time  3    Period  Weeks    Status  Partially Met    Target Date  11/17/17      PT SHORT TERM GOAL #2   Title  Patient will complete five times sit to stand test in < 24 seconds (2 MCIDs) indicating increased LE strength and improved balance in order to improve mobility and decrease risk of falls.    Baseline  (10/26/2017) 29 sec; (11/28/2017) 26.8 sec    Time  3    Period  Weeks    Status  Partially Met    Target Date  11/17/17      PT SHORT TERM GOAL #3   Title  Patient will ambulate with RW, SBA, on level surfaces for >150 feet with no LOB and no sitting breaks in order to demonstrate increased independence and improved mobility.    Baseline  (10/26/2017) <25 feet, (11/28/2017), 375 ft    Time  3    Period  Weeks    Status  Achieved    Target Date  11/17/17        PT Long Term Goals - 11/28/17 1515      PT LONG TERM GOAL #1   Title  Patient will increase Berg Balance score to 46/56 to demonstrate decreased fall risk during functional activities and maintain safety.    Baseline  (10/26/2017) 23/56; 11/28/2017: 39/56    Time  8    Period  Weeks    Status  On-going    Target Date  12/22/17      PT LONG TERM GOAL #2   Title  Patient will complete five times sit to stand test in < 15 seconds (2 MCIDs) indicating increased LE strength and improved balance in order to improve mobility and decrease risk of falls.    Baseline  (10/26/2017) 29 sec; (11/28/2017) 26.8 sec    Time  12    Period  Weeks    Status  On-going    Target Date  01/19/18      PT LONG TERM GOAL #3   Title  Patient will be independent in bending down towards floor and picking up small object (<5 pounds) and then stand back up without loss of balance in order to return to PLOF and meet demands at work.    Baseline  (10/26/2017) not assessed 2/2 BLT precautions; 11/28/2017: able to pick up a  cone w/o UE support, no loss of balance    Time  12    Period  Weeks    Status  Achieved    Target Date  01/19/18      PT LONG TERM GOAL #4   Title  Patient will reduce  timed up and go to <11 seconds to reduce fall risk and demonstrate improved transfer/gait ability.    Baseline  10/26/2017: not assessed 2/2 to fatigue/endurance; 11/02/17: 43 seconds; 11/28/2017: 25.1 seconds (RW)    Time  12    Period  Weeks    Status  Partially Met    Target Date  01/19/18            Plan - 11/30/17 1603    Clinical Impression Statement  Patient continues todemonstrate excellent motivation during session.Continued to progress to more standing exercises today. Increased resistance with leg press. Will continue to advance strengthening and balance exercises in future session. Pt encouraged to continue HEPandfollow-up as scheduled.Pt presents withdeficits in strength, activity tolerance, motor control, and balance which will continue to benefit from skilled therapeutic intervention.    Rehab Potential  Good    PT Frequency  2x / week    PT Duration  12 weeks    PT Treatment/Interventions  Cryotherapy;Moist Heat;Ultrasound;Gait training;Stair training;Functional mobility training;Therapeutic activities;Therapeutic exercise;Balance training;Neuromuscular re-education;Patient/family education;Orthotic Fit/Training;Manual techniques;Scar mobilization;Dry needling;Energy conservation;Electrical Stimulation    PT Next Visit Plan  BLE strengthening, balance    PT Home Exercise Plan  glute sets, LAQ, sit-to-stand with supervision and RW, standing hip aBduction (HEP provided at hospital d/c)    Consulted and Agree with Plan of Care  Patient;Family member/caregiver    Family Member Consulted  Spouse       Patient will benefit from skilled therapeutic intervention in order to improve the following deficits and impairments:  Abnormal gait, Decreased endurance, Impaired sensation, Decreased activity  tolerance, Decreased strength, Pain, Decreased balance, Decreased mobility, Difficulty walking, Improper body mechanics, Decreased range of motion, Decreased coordination, Postural dysfunction  Visit Diagnosis: Difficulty in walking, not elsewhere classified  Muscle weakness (generalized)     Problem List Patient Active Problem List   Diagnosis Date Noted  . Allergic state 03/14/2017  . Arthritis 03/14/2017  . Hypertension 03/14/2017  . Osteoporosis, post-menopausal 03/14/2017  . Benign paroxysmal positional vertigo due to bilateral vestibular disorder 06/30/2015   Phillips Grout PT, DPT, GCS  Asyah Candler 11/30/2017, 4:05 PM  [place] MAIN [facility] SERVICES [address] [place], [place], [address] Phone: [phone]   Fax:  [phone]  Name: Jaclyn Hall MRN: [id number] Date of Birth: [birth year]

## 2017-12-05 ENCOUNTER — Ambulatory Visit: Payer: BLUE CROSS/BLUE SHIELD

## 2017-12-05 DIAGNOSIS — M6281 Muscle weakness (generalized): Secondary | ICD-10-CM

## 2017-12-05 DIAGNOSIS — R262 Difficulty in walking, not elsewhere classified: Secondary | ICD-10-CM

## 2017-12-05 NOTE — Therapy (Signed)
[place] MAIN [facility] SERVICES [address] [place], [place], [address] Phone: [phone]   Fax:  [phone]  Physical Therapy Treatment  Patient Details  Name: Jaclyn Hall MRN: [id number] Date of Birth: 01/15/1946 Referring Provider (PT): Merrilee Seashore, NP   Encounter Date: 12/05/2017  PT End of Session - 12/05/17 1437    Visit Number  12    Number of Visits  25    Date for PT Re-Evaluation  01/19/18    Authorization Type  3/10 progress note (11/28/2017)    PT Start Time  1432    PT Stop Time  1515    PT Time Calculation (min)  43 min    Equipment Utilized During Treatment  Gait belt    Activity Tolerance  Patient tolerated treatment well;No increased pain    Behavior During Therapy  WFL for tasks assessed/performed       Past Medical History:  Diagnosis Date  . Arthritis   . Asthma   . Colon polyps   . Hypertension   . Osteoporosis   . Vertigo     Past Surgical History:  Procedure Laterality Date  . ABDOMINAL HYSTERECTOMY    . CHOLECYSTECTOMY    . COLONOSCOPY    . COLONOSCOPY N/A 11/22/2014   Procedure: COLONOSCOPY;  Surgeon: Lollie Sails, MD;  Location: [facility] ENDOSCOPY;  Service: Endoscopy;  Laterality: N/A;  . OOPHORECTOMY      There were no vitals filed for this visit.  Subjective Assessment - 12/05/17 1436    Subjective  Patient reports she is very stiff in her knees, ankles, and shoulders today. Otherwise she is doing well. She reports no pain and denies any falls. L knee swelling has improved.     Patient is accompained by:  Family member   [name]    Pertinent History  Patient is 72 year old female with diagnosis of spinal cord compression at T4/5 s/p laminectomy with decreased mobility and function. PMH significant for hypertension, osteoporosis, arthritis, and BPPV.     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  No/denies          TREATMENT   Therapeutic Exercise: Nu-stepintervals L6x 30s, L3x 1 minute x 5 minutes total with 1 minute warm-up and 1 minute cool down, fatigue monitored and history obtained during warm-up and cool down; Gait in gymand out in [id number]'with rolling walker, fatigue monitored, cues for horizontal and vertical head turns as well as gait speed changes. Pt reporting fatigue and needs occasional rest breaks. Intermittent LE buckling but pt able to self-correct; Quantum leg press 125# 2 x 20, assist required to prevent bilateral knee hyperextension as well as assist for controlled eccentric lowering; Standing marches with 2# ankle weights (AW) x 10 bilateral; Standing HS curls with 2# AW x 10 bilateral; Standing hip extension with 2# AW x 10 bilateral; Standing hip abduction with 2# AW x 10 bilateral; Standing mini squats x 10; Seated clams with manual resistance x 15; Seated adductor squeezes with manual resistance x 15; Seated LAQ 2# AW x 15 bilateral;   Pt educated throughout session about proper posture and technique with exercises. Improved exercise technique, movement at target joints, use of target muscles after min to mod verbal, visual, tactile cues.  Patient continues todemonstrate excellent motivation during session.She is slighlty more fatigued today. Continued to progress to more standing exercises today.Increased resistance with leg press but pt continues to struggle with eccentric control with lowering.Will continue to advance strengthening and balance exercises in future session. Pt encouraged to continue HEPandfollow-up as scheduled.Pt presents withdeficits in strength, activity tolerance, motor control, and balance which will continue to benefit from skilled therapeutic  intervention.                        PT Short Term Goals - 11/28/17 1449      PT SHORT TERM GOAL #1   Title  Patient will be adherent to HEP at least 3x a week to improve functional strength in BLE and balance in order to decrease risk of falls and maintain safety at home.    Baseline  Walking supported, sit <>stands performed most days per patient reports    Time  3    Period  Weeks    Status  Partially Met    Target Date  11/17/17      PT SHORT TERM GOAL #2   Title  Patient will complete five times sit to stand test in < 24 seconds (2 MCIDs) indicating increased LE strength and improved balance in order to improve mobility and decrease risk of falls.    Baseline  (10/26/2017) 29 sec; (11/28/2017) 26.8 sec    Time  3    Period  Weeks    Status  Partially Met    Target Date  11/17/17      PT SHORT TERM GOAL #3   Title  Patient will ambulate with RW, SBA, on level surfaces for >150 feet with no LOB and no sitting breaks in order to demonstrate increased independence and improved mobility.    Baseline  (10/26/2017) <25 feet, (11/28/2017), 375 ft    Time  3    Period  Weeks    Status  Achieved    Target Date  11/17/17        PT Long Term Goals - 11/28/17 1515      PT LONG TERM GOAL #1   Title  Patient will increase Berg Balance score to 46/56 to demonstrate decreased fall risk during functional activities and maintain safety.    Baseline  (10/26/2017) 23/56; 11/28/2017: 39/56    Time  8    Period  Weeks    Status  On-going    Target Date  12/22/17      PT LONG TERM GOAL #2   Title  Patient will complete five times sit to stand test in < 15 seconds (2 MCIDs) indicating increased LE strength and improved balance in order to improve mobility and decrease risk of falls.    Baseline  (10/26/2017) 29 sec; (11/28/2017) 26.8 sec    Time  12    Period  Weeks    Status  On-going    Target Date  01/19/18      PT LONG TERM GOAL #3   Title  Patient will be  independent in bending down towards floor and picking up small object (<5 pounds) and then stand back up without loss of balance in order to return to PLOF and meet demands at work.    Baseline  (10/26/2017) not assessed 2/2 BLT precautions; 11/28/2017: able to pick up a cone w/o UE support, no loss of balance    Time  12    Period  Weeks  Status  Achieved    Target Date  01/19/18      PT LONG TERM GOAL #4   Title  Patient will reduce timed up and go to <11 seconds to reduce fall risk and demonstrate improved transfer/gait ability.    Baseline  10/26/2017: not assessed 2/2 to fatigue/endurance; 11/02/17: 43 seconds; 11/28/2017: 25.1 seconds (RW)    Time  12    Period  Weeks    Status  Partially Met    Target Date  01/19/18            Plan - 12/05/17 1510    Clinical Impression Statement  Patient continues todemonstrate excellent motivation during session.She is slighlty more fatigued today. Continued to progress to more standing exercises today.Increased resistance with leg press but pt continues to struggle with eccentric control with lowering.Will continue to advance strengthening and balance exercises in future session. Pt encouraged to continue HEPandfollow-up as scheduled.Pt presents withdeficits in strength, activity tolerance, motor control, and balance which will continue to benefit from skilled therapeutic intervention.    Rehab Potential  Good    PT Frequency  2x / week    PT Duration  12 weeks    PT Treatment/Interventions  Cryotherapy;Moist Heat;Ultrasound;Gait training;Stair training;Functional mobility training;Therapeutic activities;Therapeutic exercise;Balance training;Neuromuscular re-education;Patient/family education;Orthotic Fit/Training;Manual techniques;Scar mobilization;Dry needling;Energy conservation;Electrical Stimulation    PT Next Visit Plan  BLE strengthening, balance    PT Home Exercise Plan  glute sets, LAQ, sit-to-stand with supervision and RW,  standing hip aBduction (HEP provided at hospital d/c)    Consulted and Agree with Plan of Care  Patient;Family member/caregiver    Family Member Consulted  Spouse       Patient will benefit from skilled therapeutic intervention in order to improve the following deficits and impairments:  Abnormal gait, Decreased endurance, Impaired sensation, Decreased activity tolerance, Decreased strength, Pain, Decreased balance, Decreased mobility, Difficulty walking, Improper body mechanics, Decreased range of motion, Decreased coordination, Postural dysfunction  Visit Diagnosis: Difficulty in walking, not elsewhere classified  Muscle weakness (generalized)     Problem List Patient Active Problem List   Diagnosis Date Noted  . Allergic state 03/14/2017  . Arthritis 03/14/2017  . Hypertension 03/14/2017  . Osteoporosis, post-menopausal 03/14/2017  . Benign paroxysmal positional vertigo due to bilateral vestibular disorder 06/30/2015   Phillips Grout PT, DPT, GCS  Charnelle Bergeman 12/05/2017, 4:12 PM  [place] MAIN [facility] SERVICES [address] [place], [place], [address] Phone: [phone]   Fax:  [phone]  Name: Jaclyn Hall MRN: [id number] Date of Birth: [birth year]

## 2017-12-07 ENCOUNTER — Ambulatory Visit: Payer: BLUE CROSS/BLUE SHIELD

## 2017-12-07 DIAGNOSIS — R262 Difficulty in walking, not elsewhere classified: Secondary | ICD-10-CM | POA: Diagnosis not present

## 2017-12-07 DIAGNOSIS — M6281 Muscle weakness (generalized): Secondary | ICD-10-CM

## 2017-12-07 NOTE — Therapy (Signed)
[place] MAIN [facility] SERVICES [address] [place], [place], [address] Phone: [phone]   Fax:  [phone]  Physical Therapy Treatment  Patient Details  Name: Jaclyn Hall MRN: [id number] Date of Birth: April 02, 1945 Referring Provider (PT): Merrilee Seashore, NP   Encounter Date: 12/07/2017  PT End of Session - 12/07/17 1439    Visit Number  13    Number of Visits  25    Date for PT Re-Evaluation  01/19/18    Authorization Type  4/10 progress note (11/28/2017)    PT Start Time  1433    PT Stop Time  1515    PT Time Calculation (min)  42 min    Equipment Utilized During Treatment  Gait belt    Activity Tolerance  Patient tolerated treatment well;No increased pain    Behavior During Therapy  WFL for tasks assessed/performed       Past Medical History:  Diagnosis Date  . Arthritis   . Asthma   . Colon polyps   . Hypertension   . Osteoporosis   . Vertigo     Past Surgical History:  Procedure Laterality Date  . ABDOMINAL HYSTERECTOMY    . CHOLECYSTECTOMY    . COLONOSCOPY    . COLONOSCOPY N/A 11/22/2014   Procedure: COLONOSCOPY;  Surgeon: Lollie Sails, MD;  Location: [facility] ENDOSCOPY;  Service: Endoscopy;  Laterality: N/A;  . OOPHORECTOMY      There were no vitals filed for this visit.  Subjective Assessment - 12/07/17 1438    Subjective  Patient reports she is less stiff today in her knees, ankles, and shoulders.  She reports no pain and denies any falls since last visit. No specific questions or concerns at this time.    Patient is accompained by:  Family member   Jaclyn Hall    Pertinent History  Patient is 72 year old female with diagnosis of spinal cord compression at T4/5 s/p laminectomy with decreased mobility and function. PMH significant for hypertension, osteoporosis, arthritis, and BPPV.     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  No/denies           TREATMENT   Therapeutic Exercise: Nu-step HIIT L6x 30s, L3x 1 minute x 5 minutes total with1 minute warm-up and1 minute cool down, fatigue monitored and history obtained during warm-up and cool down; Quantum single leg press60# x 20, 65# x 20 assist required initially to prevent bilateral knee hyperextension as well as assist for controlled eccentric lowering but with cues pt does better controlling weights back down; Standing mini squats x 10; Step-ups to 4" step alternating LE with single UE support x 10; Seated clams with manual resistance x 15; Seated adductor squeezes with manual resistance x 15; Sit to stand without UE support with pt completing as fast as possible 2 x 10; Seated LAQ 3# AW x 15 bilateral; Standing marches with 3# ankle weights (AW) x 10 bilateral; Standing HS curls with 3# AW x 10 bilateral; Standing hip extension with 3# AW x 10 bilateral; Standing hip abduction with 3# AW x 10 bilateral;   Pt educated throughout session about proper posture and technique with exercises. Improved exercise technique, movement at target joints, use of target muscles after min to mod verbal,  visual, tactile cues.   Patient continues todemonstrate excellent motivation during session.She is less fatigued today compared to last session. Deferred gait training on this date in favor of more strengthening exercises. Continued to progress to more standing exercises today and increased ankle weights. Also increased resistance withleg press and pt demonstrates improved eccentric control today.Will continue to advance strengthening and balance exercises in future session.Pt encouraged to continue HEPandfollow-up as scheduled.Pt presents withdeficits in strength, activity tolerance, motor control, and balance and will continue to  benefit from skilled therapeutic intervention.                        PT Short Term Goals - 11/28/17 1449      PT SHORT TERM GOAL #1   Title  Patient will be adherent to HEP at least 3x a week to improve functional strength in BLE and balance in order to decrease risk of falls and maintain safety at home.    Baseline  Walking supported, sit <>stands performed most days per patient reports    Time  3    Period  Weeks    Status  Partially Met    Target Date  11/17/17      PT SHORT TERM GOAL #2   Title  Patient will complete five times sit to stand test in < 24 seconds (2 MCIDs) indicating increased LE strength and improved balance in order to improve mobility and decrease risk of falls.    Baseline  (10/26/2017) 29 sec; (11/28/2017) 26.8 sec    Time  3    Period  Weeks    Status  Partially Met    Target Date  11/17/17      PT SHORT TERM GOAL #3   Title  Patient will ambulate with RW, SBA, on level surfaces for >150 feet with no LOB and no sitting breaks in order to demonstrate increased independence and improved mobility.    Baseline  (10/26/2017) <25 feet, (11/28/2017), 375 ft    Time  3    Period  Weeks    Status  Achieved    Target Date  11/17/17        PT Long Term Goals - 11/28/17 1515      PT LONG TERM GOAL #1   Title  Patient will increase Berg Balance score to 46/56 to demonstrate decreased fall risk during functional activities and maintain safety.    Baseline  (10/26/2017) 23/56; 11/28/2017: 39/56    Time  8    Period  Weeks    Status  On-going    Target Date  12/22/17      PT LONG TERM GOAL #2   Title  Patient will complete five times sit to stand test in < 15 seconds (2 MCIDs) indicating increased LE strength and improved balance in order to improve mobility and decrease risk of falls.    Baseline  (10/26/2017) 29 sec; (11/28/2017) 26.8 sec    Time  12    Period  Weeks    Status  On-going    Target Date  01/19/18      PT LONG TERM GOAL #3    Title  Patient will be independent in bending down towards floor and picking up small object (<5 pounds) and then stand back up without loss of balance in order to return to PLOF and meet demands at work.    Baseline  (10/26/2017) not assessed 2/2 BLT precautions; 11/28/2017: able to pick up a  cone w/o UE support, no loss of balance    Time  12    Period  Weeks    Status  Achieved    Target Date  01/19/18      PT LONG TERM GOAL #4   Title  Patient will reduce timed up and go to <11 seconds to reduce fall risk and demonstrate improved transfer/gait ability.    Baseline  10/26/2017: not assessed 2/2 to fatigue/endurance; 11/02/17: 43 seconds; 11/28/2017: 25.1 seconds (RW)    Time  12    Period  Weeks    Status  Partially Met    Target Date  01/19/18            Plan - 12/07/17 1439    Clinical Impression Statement  Patient continues todemonstrate excellent motivation during session.She is less fatigued today compared to last session. Deferred gait training on this date in favor of more strengthening exercises. Continued to progress to more standing exercises today and increased ankle weights. Also increased resistance withleg press and pt demonstrates improved eccentric control today.Will continue to advance strengthening and balance exercises in future session.Pt encouraged to continue HEPandfollow-up as scheduled.Pt presents withdeficits in strength, activity tolerance, motor control, and balance and will continue to benefit from skilled therapeutic intervention.    Rehab Potential  Good    PT Frequency  2x / week    PT Duration  12 weeks    PT Treatment/Interventions  Cryotherapy;Moist Heat;Ultrasound;Gait training;Stair training;Functional mobility training;Therapeutic activities;Therapeutic exercise;Balance training;Neuromuscular re-education;Patient/family education;Orthotic Fit/Training;Manual techniques;Scar mobilization;Dry needling;Energy conservation;Electrical  Stimulation    PT Next Visit Plan  BLE strengthening, balance    PT Home Exercise Plan  glute sets, LAQ, sit-to-stand with supervision and RW, standing hip aBduction (HEP provided at hospital d/c)    Consulted and Agree with Plan of Care  Patient;Family member/caregiver    Family Member Consulted  Spouse       Patient will benefit from skilled therapeutic intervention in order to improve the following deficits and impairments:  Abnormal gait, Decreased endurance, Impaired sensation, Decreased activity tolerance, Decreased strength, Pain, Decreased balance, Decreased mobility, Difficulty walking, Improper body mechanics, Decreased range of motion, Decreased coordination, Postural dysfunction  Visit Diagnosis: Difficulty in walking, not elsewhere classified  Muscle weakness (generalized)     Problem List Patient Active Problem List   Diagnosis Date Noted  . Allergic state 03/14/2017  . Arthritis 03/14/2017  . Hypertension 03/14/2017  . Osteoporosis, post-menopausal 03/14/2017  . Benign paroxysmal positional vertigo due to bilateral vestibular disorder 06/30/2015   Phillips Grout PT, DPT, GCS  Jaclyn Hall 12/07/2017, 5:11 PM  [place] MAIN [facility] SERVICES [address] [place], [place], [address] Phone: [phone]   Fax:  [phone]  Name: Jaclyn Hall MRN: [id number] Date of Birth: May 23, 1945

## 2017-12-12 ENCOUNTER — Ambulatory Visit: Payer: BLUE CROSS/BLUE SHIELD

## 2017-12-12 VITALS — BP 173/72 | HR 83

## 2017-12-12 DIAGNOSIS — R262 Difficulty in walking, not elsewhere classified: Secondary | ICD-10-CM | POA: Diagnosis not present

## 2017-12-12 DIAGNOSIS — M6281 Muscle weakness (generalized): Secondary | ICD-10-CM

## 2017-12-12 NOTE — Therapy (Signed)
[place] MAIN [facility] SERVICES [address] [place], [place], [address] Phone: [phone]   Fax:  [phone]  Physical Therapy Treatment  Patient Details  Name: Jaclyn Hall MRN: [id number] Date of Birth: [birth year] Referring Provider (PT): Merrilee Seashore, NP   Encounter Date: 12/12/2017  PT End of Session - 12/12/17 1440    Visit Number  14    Number of Visits  25    Date for PT Re-Evaluation  01/19/18    Authorization Type  5/10 progress note (11/28/2017)    PT Start Time  1440    PT Stop Time  1515    PT Time Calculation (min)  35 min    Equipment Utilized During Treatment  Gait belt    Activity Tolerance  Patient tolerated treatment well;No increased pain    Behavior During Therapy  WFL for tasks assessed/performed       Past Medical History:  Diagnosis Date  . Arthritis   . Asthma   . Colon polyps   . Hypertension   . Osteoporosis   . Vertigo     Past Surgical History:  Procedure Laterality Date  . ABDOMINAL HYSTERECTOMY    . CHOLECYSTECTOMY    . COLONOSCOPY    . COLONOSCOPY N/A 11/22/2014   Procedure: COLONOSCOPY;  Surgeon: Lollie Sails, MD;  Location: [facility] ENDOSCOPY;  Service: Endoscopy;  Laterality: N/A;  . OOPHORECTOMY      Vitals:   12/12/17 1441  BP: (!) 173/72  Pulse: 83  SpO2: 97%    Subjective Assessment - 12/12/17 1440    Subjective  Patient reports that she is doing well today. She had some pain in her knees this morning but not now. No specific questions or concerns currently. No falls since last visit.     Patient is accompained by:  Family member   Jaclyn Hall    Pertinent History  Patient is 72 year old female with diagnosis of spinal cord compression at T4/5 s/p laminectomy with decreased mobility and function. PMH significant for hypertension, osteoporosis, arthritis, and BPPV.     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  No/denies           TREATMENT   Therapeutic Exercise: Nu-step L4 x 5 minutes for warmup during history (3 minutes unbilled); Quantum double leg press 135# 2 x 20, assist required for controlled eccentric lowering as the knees and hips are close to full flexion; Alternating step taps to treadmill while in Lite Gait harness without UE support alternating LE x 10 each;    Gait Training Gait in gymand out in [id number]'with rolling walker, fatigue monitored, cues for horizontal and vertical head turnsas well as gait speed changes. Pt reporting fatigue and needs occasional rest breaks. Intermittent LE buckling but pt able to self-correct; Gait training in rehab gym with Lite Gait without assistive device. Pt with notable instability as well as LE buckling. Pt has one episode of complete LOB. Pt provided cues for stepping and posture.   Pt educated throughout session about proper posture and technique with exercises. Improved exercise technique, movement at target joints, use of target muscles after min to mod verbal, visual, tactile cues.   Patient arrived late for her appointment so session was slightly shorter  than normal. Her BP is a little elevated today but pt reports that she didn't take her BP meds this morning and will take them when she gets home. She continues todemonstrate excellent motivation during session. Worked more on gait training today secondary to elevated BP. Pt is able to perform ambulation in Lite Gait without assistive device but does demonstrate instability and has one complete LOB.Will continue to advance strengthening and balance exercises in future session.Pt encouraged to continue HEPandfollow-up as scheduled.Pt presents withdeficits in strength, activity tolerance, motor control, and balance and will  continue to benefit from skilled therapeutic intervention.                      PT Short Term Goals - 11/28/17 1449      PT SHORT TERM GOAL #1   Title  Patient will be adherent to HEP at least 3x a week to improve functional strength in BLE and balance in order to decrease risk of falls and maintain safety at home.    Baseline  Walking supported, sit <>stands performed most days per patient reports    Time  3    Period  Weeks    Status  Partially Met    Target Date  11/17/17      PT SHORT TERM GOAL #2   Title  Patient will complete five times sit to stand test in < 24 seconds (2 MCIDs) indicating increased LE strength and improved balance in order to improve mobility and decrease risk of falls.    Baseline  (10/26/2017) 29 sec; (11/28/2017) 26.8 sec    Time  3    Period  Weeks    Status  Partially Met    Target Date  11/17/17      PT SHORT TERM GOAL #3   Title  Patient will ambulate with RW, SBA, on level surfaces for >150 feet with no LOB and no sitting breaks in order to demonstrate increased independence and improved mobility.    Baseline  (10/26/2017) <25 feet, (11/28/2017), 375 ft    Time  3    Period  Weeks    Status  Achieved    Target Date  11/17/17        PT Long Term Goals - 11/28/17 1515      PT LONG TERM GOAL #1   Title  Patient will increase Berg Balance score to 46/56 to demonstrate decreased fall risk during functional activities and maintain safety.    Baseline  (10/26/2017) 23/56; 11/28/2017: 39/56    Time  8    Period  Weeks    Status  On-going    Target Date  12/22/17      PT LONG TERM GOAL #2   Title  Patient will complete five times sit to stand test in < 15 seconds (2 MCIDs) indicating increased LE strength and improved balance in order to improve mobility and decrease risk of falls.    Baseline  (10/26/2017) 29 sec; (11/28/2017) 26.8 sec    Time  12    Period  Weeks    Status  On-going    Target Date  01/19/18      PT LONG TERM  GOAL #3   Title  Patient will be independent in bending down towards floor and picking up small object (<5 pounds) and then stand back up without loss of balance in order to return to PLOF and meet demands at work.    Baseline  (10/26/2017) not assessed  2/2 BLT precautions; 11/28/2017: able to pick up a cone w/o UE support, no loss of balance    Time  12    Period  Weeks    Status  Achieved    Target Date  01/19/18      PT LONG TERM GOAL #4   Title  Patient will reduce timed up and go to <11 seconds to reduce fall risk and demonstrate improved transfer/gait ability.    Baseline  10/26/2017: not assessed 2/2 to fatigue/endurance; 11/02/17: 43 seconds; 11/28/2017: 25.1 seconds (RW)    Time  12    Period  Weeks    Status  Partially Met    Target Date  01/19/18            Plan - 12/12/17 1444    Clinical Impression Statement  Patient arrived late for her appointment so session was slightly shorter than normal. Her BP is a little elevated today but pt reports that she didn't take her BP meds this morning and will take them when she gets home. She continues todemonstrate excellent motivation during session. Worked more on gait training today secondary to elevated BP. Pt is able to perform ambulation in Lite Gait without assistive device but does demonstrate instability and has one complete LOB.Will continue to advance strengthening and balance exercises in future session.Pt encouraged to continue HEPandfollow-up as scheduled.Pt presents withdeficits in strength, activity tolerance, motor control, and balance and will continue to benefit from skilled therapeutic intervention.    Rehab Potential  Good    PT Frequency  2x / week    PT Duration  12 weeks    PT Treatment/Interventions  Cryotherapy;Moist Heat;Ultrasound;Gait training;Stair training;Functional mobility training;Therapeutic activities;Therapeutic exercise;Balance training;Neuromuscular re-education;Patient/family  education;Orthotic Fit/Training;Manual techniques;Scar mobilization;Dry needling;Energy conservation;Electrical Stimulation    PT Next Visit Plan  BLE strengthening, balance    PT Home Exercise Plan  glute sets, LAQ, sit-to-stand with supervision and RW, standing hip aBduction (HEP provided at hospital d/c)    Consulted and Agree with Plan of Care  Patient;Family member/caregiver    Family Member Consulted  Spouse       Patient will benefit from skilled therapeutic intervention in order to improve the following deficits and impairments:  Abnormal gait, Decreased endurance, Impaired sensation, Decreased activity tolerance, Decreased strength, Pain, Decreased balance, Decreased mobility, Difficulty walking, Improper body mechanics, Decreased range of motion, Decreased coordination, Postural dysfunction  Visit Diagnosis: Difficulty in walking, not elsewhere classified  Muscle weakness (generalized)     Problem List Patient Active Problem List   Diagnosis Date Noted  . Allergic state 03/14/2017  . Arthritis 03/14/2017  . Hypertension 03/14/2017  . Osteoporosis, post-menopausal 03/14/2017  . Benign paroxysmal positional vertigo due to bilateral vestibular disorder 06/30/2015    Claire Bridge 12/13/2017, 4:17 PM  [place] MAIN [facility] SERVICES [address] [place], [place], [address] Phone: [phone]   Fax:  [phone]  Name: ANAISE STERBENZ MRN: [id number] Date of Birth: [birth year]

## 2017-12-14 ENCOUNTER — Ambulatory Visit: Payer: BLUE CROSS/BLUE SHIELD

## 2017-12-14 DIAGNOSIS — R262 Difficulty in walking, not elsewhere classified: Secondary | ICD-10-CM | POA: Diagnosis not present

## 2017-12-14 DIAGNOSIS — M6281 Muscle weakness (generalized): Secondary | ICD-10-CM

## 2017-12-14 NOTE — Therapy (Signed)
[place] MAIN [facility] SERVICES [address] [place], [place], [address] Phone: [phone]   Fax:  [phone]  Physical Therapy Treatment  Patient Details  Name: Jaclyn Hall MRN: [id number] Date of Birth: 1945/09/19 Referring Provider (PT): Merrilee Seashore, NP   Encounter Date: 12/14/2017  PT End of Session - 12/14/17 1440    Visit Number  15    Number of Visits  25    Date for PT Re-Evaluation  01/19/18    Authorization Type  6/10 progress note (11/28/2017)    PT Start Time  0383    PT Stop Time  1515    PT Time Calculation (min)  43 min    Equipment Utilized During Treatment  Gait belt    Activity Tolerance  Patient tolerated treatment well;No increased pain    Behavior During Therapy  WFL for tasks assessed/performed       Past Medical History:  Diagnosis Date  . Arthritis   . Asthma   . Colon polyps   . Hypertension   . Osteoporosis   . Vertigo     Past Surgical History:  Procedure Laterality Date  . ABDOMINAL HYSTERECTOMY    . CHOLECYSTECTOMY    . COLONOSCOPY    . COLONOSCOPY N/A 11/22/2014   Procedure: COLONOSCOPY;  Surgeon: Lollie Sails, MD;  Location: [facility] ENDOSCOPY;  Service: Endoscopy;  Laterality: N/A;  . OOPHORECTOMY      There were no vitals filed for this visit.  Subjective Assessment - 12/14/17 1432    Subjective  Patient reports that she is doing well today. Just prior to coming to therapy she was trying to stand up from a chair when her legs got tangled and buckled. She slid down to the floor but didn't injure herself. No pain upon arrival today. No specific questions at htis time.    Patient is accompained by:  Family member   [name]    Pertinent History  Patient is 72 year old female with diagnosis of spinal cord compression at T4/5 s/p laminectomy with decreased mobility and function. PMH significant for hypertension, osteoporosis, arthritis, and BPPV.     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  No/denies            TREATMENT   Therapeutic Exercise: Nu-step HIIT L6 x 30s, L3 x 1 minute, 5 minutes total with 1 additional minute for cool down. Interval history obtained; Quantum single leg press 65# x 20, 75# x 20 assist required initially to prevent bilateral knee hyperextension as well as assist for controlled eccentric lowering but with cues pt does better controlling weights back down. During second set pt requires considerable assist with RLE eccentric lowering; Seated clams with manual resistance x 20; Seated adductor squeeze x 20;   Gait Training Gait training in rehab gym with Lite Gait without assistive device. Pt with notable instability as well as LE buckling. Total distance ambulated approximate 50'. Pt has a couple episodes of LE buckling and LOB requiring harness to support her. Pt provided cues for stepping and posture.   Pt educated throughout session about proper posture and technique with exercises. Improved exercise technique, movement at target joints, use of target muscles after min to mod verbal, visual, tactile cues.  Patient continues todemonstrate excellent motivation during session. Continued to work on gait training today with ambulation in Lite Gait without assistive device. Pt does fatigue relatively quickly but is more steady than during prior session. She is able to progress leg press resistance again today.Will continue to advance strengthening and balance exercises in future session.Pt encouraged to continue HEPandfollow-up as scheduled.Pt presents withdeficits in strength, activity tolerance, motor control, and balance and will continue to benefit from skilled therapeutic  intervention.                     PT Short Term Goals - 11/28/17 1449      PT SHORT TERM GOAL #1   Title  Patient will be adherent to HEP at least 3x a week to improve functional strength in BLE and balance in order to decrease risk of falls and maintain safety at home.    Baseline  Walking supported, sit <>stands performed most days per patient reports    Time  3    Period  Weeks    Status  Partially Met    Target Date  11/17/17      PT SHORT TERM GOAL #2   Title  Patient will complete five times sit to stand test in < 24 seconds (2 MCIDs) indicating increased LE strength and improved balance in order to improve mobility and decrease risk of falls.    Baseline  (10/26/2017) 29 sec; (11/28/2017) 26.8 sec    Time  3    Period  Weeks    Status  Partially Met    Target Date  11/17/17      PT SHORT TERM GOAL #3   Title  Patient will ambulate with RW, SBA, on level surfaces for >150 feet with no LOB and no sitting breaks in order to demonstrate increased independence and improved mobility.    Baseline  (10/26/2017) <25 feet, (11/28/2017), 375 ft    Time  3    Period  Weeks    Status  Achieved    Target Date  11/17/17        PT Long Term Goals - 11/28/17 1515      PT LONG TERM GOAL #1   Title  Patient will increase Berg Balance score to 46/56 to demonstrate decreased fall risk during functional activities and maintain safety.    Baseline  (10/26/2017) 23/56; 11/28/2017: 39/56    Time  8    Period  Weeks    Status  On-going    Target Date  12/22/17      PT LONG TERM GOAL #2   Title  Patient will complete five times sit to stand test in < 15 seconds (2 MCIDs) indicating increased LE strength and improved balance in order to improve mobility and decrease risk of falls.    Baseline  (10/26/2017) 29 sec; (11/28/2017) 26.8 sec    Time  12    Period  Weeks    Status  On-going    Target Date  01/19/18      PT LONG TERM GOAL #3   Title  Patient will be independent  in bending down towards floor and picking up small object (<5 pounds) and then stand back up without loss of balance in order to return to PLOF and meet demands at work.    Baseline  (10/26/2017) not assessed 2/2 BLT precautions; 11/28/2017: able to pick up a cone w/o UE support, no loss of balance    Time  12  Period  Weeks    Status  Achieved    Target Date  01/19/18      PT LONG TERM GOAL #4   Title  Patient will reduce timed up and go to <11 seconds to reduce fall risk and demonstrate improved transfer/gait ability.    Baseline  10/26/2017: not assessed 2/2 to fatigue/endurance; 11/02/17: 43 seconds; 11/28/2017: 25.1 seconds (RW)    Time  12    Period  Weeks    Status  Partially Met    Target Date  01/19/18            Plan - 12/14/17 1440    Clinical Impression Statement  Patient continues todemonstrate excellent motivation during session. Continued to work on gait training today with ambulation in Lite Gait without assistive device. Pt does fatigue relatively quickly but is more steady than during prior session. She is able to progress leg press resistance again today.Will continue to advance strengthening and balance exercises in future session.Pt encouraged to continue HEPandfollow-up as scheduled.Pt presents withdeficits in strength, activity tolerance, motor control, and balance and will continue to benefit from skilled therapeutic intervention.    Rehab Potential  Good    PT Frequency  2x / week    PT Duration  12 weeks    PT Treatment/Interventions  Cryotherapy;Moist Heat;Ultrasound;Gait training;Stair training;Functional mobility training;Therapeutic activities;Therapeutic exercise;Balance training;Neuromuscular re-education;Patient/family education;Orthotic Fit/Training;Manual techniques;Scar mobilization;Dry needling;Energy conservation;Electrical Stimulation    PT Next Visit Plan  BLE strengthening, balance    PT Home Exercise Plan  glute sets, LAQ, sit-to-stand  with supervision and RW, standing hip aBduction (HEP provided at hospital d/c)    Consulted and Agree with Plan of Care  Patient;Family member/caregiver    Family Member Consulted  Spouse       Patient will benefit from skilled therapeutic intervention in order to improve the following deficits and impairments:  Abnormal gait, Decreased endurance, Impaired sensation, Decreased activity tolerance, Decreased strength, Pain, Decreased balance, Decreased mobility, Difficulty walking, Improper body mechanics, Decreased range of motion, Decreased coordination, Postural dysfunction  Visit Diagnosis: Difficulty in walking, not elsewhere classified  Muscle weakness (generalized)     Problem List Patient Active Problem List   Diagnosis Date Noted  . Allergic state 03/14/2017  . Arthritis 03/14/2017  . Hypertension 03/14/2017  . Osteoporosis, post-menopausal 03/14/2017  . Benign paroxysmal positional vertigo due to bilateral vestibular disorder 06/30/2015   Phillips Grout PT, DPT, GCS  Huprich,Jason 12/14/2017, 4:23 PM  [place] MAIN [facility] SERVICES [address] [place], [place], [address] Phone: [phone]   Fax:  [phone]  Name: LORRY FURBER MRN: [id number] Date of Birth: 1945-04-26

## 2017-12-19 ENCOUNTER — Ambulatory Visit: Payer: BLUE CROSS/BLUE SHIELD | Attending: Nurse Practitioner

## 2017-12-19 DIAGNOSIS — M6281 Muscle weakness (generalized): Secondary | ICD-10-CM | POA: Diagnosis present

## 2017-12-19 DIAGNOSIS — R262 Difficulty in walking, not elsewhere classified: Secondary | ICD-10-CM | POA: Diagnosis present

## 2017-12-19 NOTE — Therapy (Signed)
[place] MAIN [facility] SERVICES [address] [place], [place], [address] Phone: [phone]   Fax:  [phone]  Physical Therapy Treatment  Patient Details  Name: Jaclyn Hall MRN: [id number] Date of Birth: 1945-02-28 Referring Provider (PT): Merrilee Seashore, NP   Encounter Date: 12/19/2017  PT End of Session - 12/19/17 1501    Visit Number  16    Number of Visits  25    Date for PT Re-Evaluation  01/19/18    Authorization Type  7/10 progress note (11/28/2017)    PT Start Time  1440    PT Stop Time  1515    PT Time Calculation (min)  35 min    Equipment Utilized During Treatment  Gait belt    Activity Tolerance  Patient tolerated treatment well;No increased pain    Behavior During Therapy  WFL for tasks assessed/performed       Past Medical History:  Diagnosis Date  . Arthritis   . Asthma   . Colon polyps   . Hypertension   . Osteoporosis   . Vertigo     Past Surgical History:  Procedure Laterality Date  . ABDOMINAL HYSTERECTOMY    . CHOLECYSTECTOMY    . COLONOSCOPY    . COLONOSCOPY N/A 11/22/2014   Procedure: COLONOSCOPY;  Surgeon: Lollie Sails, MD;  Location: [facility] ENDOSCOPY;  Service: Endoscopy;  Laterality: N/A;  . OOPHORECTOMY      There were no vitals filed for this visit.  Subjective Assessment - 12/19/17 1444    Subjective  Patient reports that she is doing well today. No pain upon arrival today. No specific questions at this time. No falls since last therapy    Patient is accompained by:  Family member   [name]    Pertinent History  Patient is 72 year old female with diagnosis of spinal cord compression at T4/5 s/p laminectomy with decreased mobility and function. PMH significant for hypertension, osteoporosis, arthritis, and BPPV.     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  No/denies          TREATMENT   Therapeutic Exercise: Nu-stepHIIT L6 x 30s, L3 x 1 minute, 5 minutes total with 1 additional minute for cool down. Interval history obtained; Quantumsingleleg press75# x 20assist required initiallyto prevent bilateral knee hyperextension as well as assist for controlled eccentric lowering but with cues pt does better controlling weights back down.  Quantum double leg press 150# x 20, improved eccentric control with lowering; Sidelying hip abduction x 15 bilateral; Sidelying clams x 15 bilateral;   Gait Training Gait training in hallway with rolling walker. Cues for step length and posture. Less bucklying noted today compared to prior sessions;   Pt educated throughout session about proper posture and technique with exercises. Improved exercise technique, movement at target joints, use of target muscles after min to mod verbal, visual, tactile cues.   Patient arrived late so session is somewhat abbreviated today. Shecontinues todemonstrate excellent motivation during session. Decreased LE buckling noted today during ambulation She is able to progress leg press resistance again today.Will continue to advance strengthening and balance exercises in future session.Pt encouraged to continue HEPandfollow-up as scheduled.Pt presents withdeficits in strength, activity tolerance, motor control, and balance and will continue to  benefit from skilled therapeutic intervention.                       PT Short Term Goals - 11/28/17 1449      PT SHORT TERM GOAL #1   Title  Patient will be adherent to HEP at least 3x a week to improve functional strength in BLE and balance in order to decrease risk of falls and maintain safety at home.    Baseline  Walking supported, sit <>stands performed most days per patient reports    Time  3    Period  Weeks     Status  Partially Met    Target Date  11/17/17      PT SHORT TERM GOAL #2   Title  Patient will complete five times sit to stand test in < 24 seconds (2 MCIDs) indicating increased LE strength and improved balance in order to improve mobility and decrease risk of falls.    Baseline  (10/26/2017) 29 sec; (11/28/2017) 26.8 sec    Time  3    Period  Weeks    Status  Partially Met    Target Date  11/17/17      PT SHORT TERM GOAL #3   Title  Patient will ambulate with RW, SBA, on level surfaces for >150 feet with no LOB and no sitting breaks in order to demonstrate increased independence and improved mobility.    Baseline  (10/26/2017) <25 feet, (11/28/2017), 375 ft    Time  3    Period  Weeks    Status  Achieved    Target Date  11/17/17        PT Long Term Goals - 11/28/17 1515      PT LONG TERM GOAL #1   Title  Patient will increase Berg Balance score to 46/56 to demonstrate decreased fall risk during functional activities and maintain safety.    Baseline  (10/26/2017) 23/56; 11/28/2017: 39/56    Time  8    Period  Weeks    Status  On-going    Target Date  12/22/17      PT LONG TERM GOAL #2   Title  Patient will complete five times sit to stand test in < 15 seconds (2 MCIDs) indicating increased LE strength and improved balance in order to improve mobility and decrease risk of falls.    Baseline  (10/26/2017) 29 sec; (11/28/2017) 26.8 sec    Time  12    Period  Weeks    Status  On-going    Target Date  01/19/18      PT LONG TERM GOAL #3   Title  Patient will be independent in bending down towards floor and picking up small object (<5 pounds) and then stand back up without loss of balance in order to return to PLOF and meet demands at work.    Baseline  (10/26/2017) not assessed 2/2 BLT precautions; 11/28/2017: able to pick up a cone w/o UE support, no loss of balance    Time  12    Period  Weeks    Status  Achieved    Target Date  01/19/18      PT LONG TERM GOAL #4   Title   Patient will reduce timed up and go to <11 seconds to reduce fall risk and demonstrate improved transfer/gait ability.    Baseline  10/26/2017: not assessed 2/2 to fatigue/endurance; 11/02/17: 43 seconds; 11/28/2017: 25.1 seconds (RW)  Time  12    Period  Weeks    Status  Partially Met    Target Date  01/19/18            Plan - 12/19/17 1501    Clinical Impression Statement  Patient arrived late so session is somewhat abbreviated today. Shecontinues todemonstrate excellent motivation during session. Decreased LE buckling noted today during ambulation She is able to progress leg press resistance again today.Will continue to advance strengthening and balance exercises in future session.Pt encouraged to continue HEPandfollow-up as scheduled.Pt presents withdeficits in strength, activity tolerance, motor control, and balance and will continue to benefit from skilled therapeutic intervention.    Rehab Potential  Good    PT Frequency  2x / week    PT Duration  12 weeks    PT Treatment/Interventions  Cryotherapy;Moist Heat;Ultrasound;Gait training;Stair training;Functional mobility training;Therapeutic activities;Therapeutic exercise;Balance training;Neuromuscular re-education;Patient/family education;Orthotic Fit/Training;Manual techniques;Scar mobilization;Dry needling;Energy conservation;Electrical Stimulation    PT Next Visit Plan  BLE strengthening, balance    PT Home Exercise Plan  glute sets, LAQ, sit-to-stand with supervision and RW, standing hip aBduction (HEP provided at hospital d/c)    Consulted and Agree with Plan of Care  Patient;Family member/caregiver    Family Member Consulted  Spouse       Patient will benefit from skilled therapeutic intervention in order to improve the following deficits and impairments:  Abnormal gait, Decreased endurance, Impaired sensation, Decreased activity tolerance, Decreased strength, Pain, Decreased balance, Decreased mobility, Difficulty  walking, Improper body mechanics, Decreased range of motion, Decreased coordination, Postural dysfunction  Visit Diagnosis: Difficulty in walking, not elsewhere classified  Muscle weakness (generalized)     Problem List Patient Active Problem List   Diagnosis Date Noted  . Allergic state 03/14/2017  . Arthritis 03/14/2017  . Hypertension 03/14/2017  . Osteoporosis, post-menopausal 03/14/2017  . Benign paroxysmal positional vertigo due to bilateral vestibular disorder 06/30/2015   Phillips Grout PT, DPT, GCS  Jaquay Posthumus 12/19/2017, 10:02 PM  [place] MAIN [facility] SERVICES [address] [place], [place], [address] Phone: [phone]   Fax:  [phone]  Name: Jaclyn Hall MRN: [id number] Date of Birth: 08-Apr-1945

## 2017-12-21 ENCOUNTER — Ambulatory Visit: Payer: BLUE CROSS/BLUE SHIELD

## 2017-12-21 DIAGNOSIS — M6281 Muscle weakness (generalized): Secondary | ICD-10-CM

## 2017-12-21 DIAGNOSIS — R262 Difficulty in walking, not elsewhere classified: Secondary | ICD-10-CM

## 2017-12-21 NOTE — Therapy (Signed)
[place] MAIN [facility] SERVICES [address] [place], [place], [address] Phone: [phone]   Fax:  [phone]  Physical Therapy Treatment  Patient Details  Name: Jaclyn Hall MRN: [id number] Date of Birth: 1945/04/30 Referring Provider (PT): Merrilee Seashore, NP   Encounter Date: 12/21/2017  PT End of Session - 12/21/17 1458    Visit Number  17    Number of Visits  25    Date for PT Re-Evaluation  01/19/18    Authorization Type  8/10 progress note (11/28/2017)    PT Start Time  1435    PT Stop Time  1515    PT Time Calculation (min)  40 min    Equipment Utilized During Treatment  Gait belt    Activity Tolerance  Patient tolerated treatment well;No increased pain    Behavior During Therapy  WFL for tasks assessed/performed       Past Medical History:  Diagnosis Date  . Arthritis   . Asthma   . Colon polyps   . Hypertension   . Osteoporosis   . Vertigo     Past Surgical History:  Procedure Laterality Date  . ABDOMINAL HYSTERECTOMY    . CHOLECYSTECTOMY    . COLONOSCOPY    . COLONOSCOPY N/A 11/22/2014   Procedure: COLONOSCOPY;  Surgeon: Lollie Sails, MD;  Location: [facility] ENDOSCOPY;  Service: Endoscopy;  Laterality: N/A;  . OOPHORECTOMY      There were no vitals filed for this visit.  Subjective Assessment - 12/21/17 1430    Subjective  Patient reports that she is doing well today. No pain upon arrival today. No specific questions at this time. No falls since last therapy session.     Patient is accompained by:  Family member   [name]    Pertinent History  Patient is 72 year old female with diagnosis of spinal cord compression at T4/5 s/p laminectomy with decreased mobility and function. PMH significant for hypertension, osteoporosis, arthritis, and BPPV.     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  No/denies           TREATMENT   Therapeutic Exercise: Quantum double leg press 155# 2 x 20, cues provided intermittently for pt to avoid uncontrolled eccentric lowering; Quantum bilateral heel raises 90# 2 x 20; Hooklying SLR x 15 bilateral; Hooklying bridges x 15; Sidelying hip abduction x 15 bilateral; Sidelying clams x 15 bilateral;   Gait Training Gait training in Lite Gait without assistive device. Cues for step length and posture. Less bucklying noted today compared to prior sessions. Pt is able to ambulate 120' and then an addition 77' after a standing rest break;   Pt educated throughout session about proper posture and technique with exercises. Improved exercise technique, movement at target joints, use of target muscles after min to mod verbal, visual, tactile cues.   Ptcontinues todemonstrate excellent motivation during session.Decreased LE buckling noted today during ambulation in Lite Gait with increase in total ambulation distance. She is able to progress leg press resistance again today.Will continue to advance strengthening and balance exercises in future session.Pt encouraged to continue HEPandfollow-up as scheduled.Pt presents withdeficits in strength, activity tolerance, motor control, and balance and will continue to benefit from skilled therapeutic intervention.  PT Short Term Goals - 11/28/17 1449      PT SHORT TERM GOAL #1   Title  Patient will be adherent to HEP at least 3x a week to improve functional strength in BLE and balance in order to decrease risk of falls and maintain safety at home.    Baseline  Walking supported, sit <>stands performed most days per patient reports    Time  3    Period  Weeks    Status  Partially Met    Target Date  11/17/17      PT SHORT TERM GOAL #2   Title  Patient will  complete five times sit to stand test in < 24 seconds (2 MCIDs) indicating increased LE strength and improved balance in order to improve mobility and decrease risk of falls.    Baseline  (10/26/2017) 29 sec; (11/28/2017) 26.8 sec    Time  3    Period  Weeks    Status  Partially Met    Target Date  11/17/17      PT SHORT TERM GOAL #3   Title  Patient will ambulate with RW, SBA, on level surfaces for >150 feet with no LOB and no sitting breaks in order to demonstrate increased independence and improved mobility.    Baseline  (10/26/2017) <25 feet, (11/28/2017), 375 ft    Time  3    Period  Weeks    Status  Achieved    Target Date  11/17/17        PT Long Term Goals - 11/28/17 1515      PT LONG TERM GOAL #1   Title  Patient will increase Berg Balance score to 46/56 to demonstrate decreased fall risk during functional activities and maintain safety.    Baseline  (10/26/2017) 23/56; 11/28/2017: 39/56    Time  8    Period  Weeks    Status  On-going    Target Date  12/22/17      PT LONG TERM GOAL #2   Title  Patient will complete five times sit to stand test in < 15 seconds (2 MCIDs) indicating increased LE strength and improved balance in order to improve mobility and decrease risk of falls.    Baseline  (10/26/2017) 29 sec; (11/28/2017) 26.8 sec    Time  12    Period  Weeks    Status  On-going    Target Date  01/19/18      PT LONG TERM GOAL #3   Title  Patient will be independent in bending down towards floor and picking up small object (<5 pounds) and then stand back up without loss of balance in order to return to PLOF and meet demands at work.    Baseline  (10/26/2017) not assessed 2/2 BLT precautions; 11/28/2017: able to pick up a cone w/o UE support, no loss of balance    Time  12    Period  Weeks    Status  Achieved    Target Date  01/19/18      PT LONG TERM GOAL #4   Title  Patient will reduce timed up and go to <11 seconds to reduce fall risk and demonstrate improved  transfer/gait ability.    Baseline  10/26/2017: not assessed 2/2 to fatigue/endurance; 11/02/17: 43 seconds; 11/28/2017: 25.1 seconds (RW)    Time  12    Period  Weeks    Status  Partially Met    Target Date  01/19/18  Plan - 12/21/17 1458    Clinical Impression Statement  Ptcontinues todemonstrate excellent motivation during session.Decreased LE buckling noted today during ambulation in Lite Gait with increase in total ambulation distance. She is able to progress leg press resistance again today.Will continue to advance strengthening and balance exercises in future session.Pt encouraged to continue HEPandfollow-up as scheduled.Pt presents withdeficits in strength, activity tolerance, motor control, and balance and will continue to benefit from skilled therapeutic intervention.    Rehab Potential  Good    PT Frequency  2x / week    PT Duration  12 weeks    PT Treatment/Interventions  Cryotherapy;Moist Heat;Ultrasound;Gait training;Stair training;Functional mobility training;Therapeutic activities;Therapeutic exercise;Balance training;Neuromuscular re-education;Patient/family education;Orthotic Fit/Training;Manual techniques;Scar mobilization;Dry needling;Energy conservation;Electrical Stimulation    PT Next Visit Plan  BLE strengthening, balance    PT Home Exercise Plan  glute sets, LAQ, sit-to-stand with supervision and RW, standing hip aBduction (HEP provided at hospital d/c)    Consulted and Agree with Plan of Care  Patient;Family member/caregiver    Family Member Consulted  Spouse       Patient will benefit from skilled therapeutic intervention in order to improve the following deficits and impairments:  Abnormal gait, Decreased endurance, Impaired sensation, Decreased activity tolerance, Decreased strength, Pain, Decreased balance, Decreased mobility, Difficulty walking, Improper body mechanics, Decreased range of motion, Decreased coordination, Postural  dysfunction  Visit Diagnosis: Difficulty in walking, not elsewhere classified  Muscle weakness (generalized)     Problem List Patient Active Problem List   Diagnosis Date Noted  . Allergic state 03/14/2017  . Arthritis 03/14/2017  . Hypertension 03/14/2017  . Osteoporosis, post-menopausal 03/14/2017  . Benign paroxysmal positional vertigo due to bilateral vestibular disorder 06/30/2015    , 12/22/2017, 11:54 AM  [place] MAIN [facility] SERVICES [address] [place], [place], [address] Phone: [phone]   Fax:  [phone]  Name: Jaclyn Hall MRN: [id number] Date of Birth: 03-23-1945

## 2017-12-27 ENCOUNTER — Ambulatory Visit: Payer: BLUE CROSS/BLUE SHIELD | Admitting: Physical Therapy

## 2017-12-27 ENCOUNTER — Encounter: Payer: Self-pay | Admitting: Physical Therapy

## 2017-12-27 DIAGNOSIS — R262 Difficulty in walking, not elsewhere classified: Secondary | ICD-10-CM | POA: Diagnosis not present

## 2017-12-27 DIAGNOSIS — M6281 Muscle weakness (generalized): Secondary | ICD-10-CM

## 2017-12-27 NOTE — Therapy (Signed)
[place] MAIN [facility] SERVICES [address] [place], [place], [address] Phone: [phone]   Fax:  [phone]  Physical Therapy Treatment  Patient Details  Name: Jaclyn Hall MRN: [id number] Date of Birth: December 29, 1945 Referring Provider (PT): Merrilee Seashore, NP   Encounter Date: 12/27/2017  PT End of Session - 12/27/17 1045    Visit Number  18    Number of Visits  25    Date for PT Re-Evaluation  01/19/18    Authorization Type  9/10 progress note (11/28/2017)    PT Start Time  1034    PT Stop Time  1115    PT Time Calculation (min)  41 min    Equipment Utilized During Treatment  Gait belt    Activity Tolerance  Patient tolerated treatment well;No increased pain    Behavior During Therapy  WFL for tasks assessed/performed       Past Medical History:  Diagnosis Date  . Arthritis   . Asthma   . Colon polyps   . Hypertension   . Osteoporosis   . Vertigo     Past Surgical History:  Procedure Laterality Date  . ABDOMINAL HYSTERECTOMY    . CHOLECYSTECTOMY    . COLONOSCOPY    . COLONOSCOPY N/A 11/22/2014   Procedure: COLONOSCOPY;  Surgeon: Lollie Sails, MD;  Location: [facility] ENDOSCOPY;  Service: Endoscopy;  Laterality: N/A;  . OOPHORECTOMY      There were no vitals filed for this visit.  Subjective Assessment - 12/27/17 1043    Subjective  Patient reports increased stiffness today; She reports some numbness in her feet which has been present since lumbar surgery; Denies any pain; Reports no recent falls;     Patient is accompained by:  Family member   Jaclyn Hall    Pertinent History  Patient is 72 year old female with diagnosis of spinal cord compression at T4/5 s/p laminectomy with decreased mobility and function. PMH significant for hypertension, osteoporosis, arthritis, and BPPV.     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  No/denies    Multiple Pain Sites  No            TREATMENT Warm up on Nustep BUE/BLE level 2 x4 min (Unbilled);  Therapeutic Exercise: Quantum double leg press, 140# x20 with mod VCs to slow down LE movement especially eccentric return for better motor control and strengthening; progressing to 155#  x 20, patient able to exhibit better control with increased repetition;  Quantum bilateral heel raises 90# x 20; Required mod VCs for correct positioning for optimum calf strengthening;   Standing: Green tband terminal knee extension x20 bilaterally; Required mod VCs for correct positioning to improve motor control of knee;    Gait Training Gait training in parallel bars, unsupported x10 feet forward/backward x3 laps with cues to increase arm swing when going forward for better reciprocal gait pattern; patient able to exhibit improved gait mechanics with arm swing; Patient does fatigue after 3rd lap with increased foot drag and knee hyperextension;  Pt instructed in sitting rest break; Then progressed to 3 additional laps forward/backward, unsupported; patient does fatigue when unsupported; She exhibits less buckling but does have decreased hip flexion during swing and decreased ankle DF at heel strike with increased foot  drag especially with fatigue;                       PT Education - 12/27/17 1045    Education Details  LE strengthening, HEP reinforced;     Person(s) Educated  Patient    Methods  Explanation;Verbal cues    Comprehension  Verbalized understanding;Returned demonstration;Verbal cues required;Need further instruction       PT Short Term Goals - 11/28/17 1449      PT SHORT TERM GOAL #1   Title  Patient will be adherent to HEP at least 3x a week to improve functional strength in BLE and balance in order to decrease risk of falls and  maintain safety at home.    Baseline  Walking supported, sit <>stands performed most days per patient reports    Time  3    Period  Weeks    Status  Partially Met    Target Date  11/17/17      PT SHORT TERM GOAL #2   Title  Patient will complete five times sit to stand test in < 24 seconds (2 MCIDs) indicating increased LE strength and improved balance in order to improve mobility and decrease risk of falls.    Baseline  (10/26/2017) 29 sec; (11/28/2017) 26.8 sec    Time  3    Period  Weeks    Status  Partially Met    Target Date  11/17/17      PT SHORT TERM GOAL #3   Title  Patient will ambulate with RW, SBA, on level surfaces for >150 feet with no LOB and no sitting breaks in order to demonstrate increased independence and improved mobility.    Baseline  (10/26/2017) <25 feet, (11/28/2017), 375 ft    Time  3    Period  Weeks    Status  Achieved    Target Date  11/17/17        PT Long Term Goals - 11/28/17 1515      PT LONG TERM GOAL #1   Title  Patient will increase Berg Balance score to 46/56 to demonstrate decreased fall risk during functional activities and maintain safety.    Baseline  (10/26/2017) 23/56; 11/28/2017: 39/56    Time  8    Period  Weeks    Status  On-going    Target Date  12/22/17      PT LONG TERM GOAL #2   Title  Patient will complete five times sit to stand test in < 15 seconds (2 MCIDs) indicating increased LE strength and improved balance in order to improve mobility and decrease risk of falls.    Baseline  (10/26/2017) 29 sec; (11/28/2017) 26.8 sec    Time  12    Period  Weeks    Status  On-going    Target Date  01/19/18      PT LONG TERM GOAL #3   Title  Patient will be independent in bending down towards floor and picking up small object (<5 pounds) and then stand back up without loss of balance in order to return to PLOF and meet demands at work.    Baseline  (10/26/2017) not assessed 2/2 BLT precautions; 11/28/2017: able to pick up a cone w/o UE  support, no loss of balance    Time  12    Period  Weeks    Status  Achieved    Target Date  01/19/18      PT LONG  TERM GOAL #4   Title  Patient will reduce timed up and go to <11 seconds to reduce fall risk and demonstrate improved transfer/gait ability.    Baseline  10/26/2017: not assessed 2/2 to fatigue/endurance; 11/02/17: 43 seconds; 11/28/2017: 25.1 seconds (RW)    Time  12    Period  Weeks    Status  Partially Met    Target Date  01/19/18            Plan - 12/27/17 1054    Clinical Impression Statement  Patient instructed in advanced LE strengthening to improve quad control for better knee stability during ambulation; she does require min VCS for correct exercise technique; Patient tolerated exercise well reporting no incresae in symptoms with advanced exercise.  Patient instructed in gait training in parallel bars unsupported ; She does require cues for correct gait mechanics including to increase step length, improve foot clearance and weight shift; She exhibited less buckling but did have heavy foot drag with fatigue; Patient would benefit from additional skilled PT intervention to improve strength, balance and gait safety;    Rehab Potential  Good    PT Frequency  2x / week    PT Duration  12 weeks    PT Treatment/Interventions  Cryotherapy;Moist Heat;Ultrasound;Gait training;Stair training;Functional mobility training;Therapeutic activities;Therapeutic exercise;Balance training;Neuromuscular re-education;Patient/family education;Orthotic Fit/Training;Manual techniques;Scar mobilization;Dry needling;Energy conservation;Electrical Stimulation    PT Next Visit Plan  BLE strengthening, balance    PT Home Exercise Plan  glute sets, LAQ, sit-to-stand with supervision and RW, standing hip aBduction (HEP provided at hospital d/c)    Consulted and Agree with Plan of Care  Patient;Family member/caregiver    Family Member Consulted  Spouse       Patient will benefit from skilled  therapeutic intervention in order to improve the following deficits and impairments:  Abnormal gait, Decreased endurance, Impaired sensation, Decreased activity tolerance, Decreased strength, Pain, Decreased balance, Decreased mobility, Difficulty walking, Improper body mechanics, Decreased range of motion, Decreased coordination, Postural dysfunction  Visit Diagnosis: Difficulty in walking, not elsewhere classified  Muscle weakness (generalized)     Problem List Patient Active Problem List   Diagnosis Date Noted  . Allergic state 03/14/2017  . Arthritis 03/14/2017  . Hypertension 03/14/2017  . Osteoporosis, post-menopausal 03/14/2017  . Benign paroxysmal positional vertigo due to bilateral vestibular disorder 06/30/2015    Jaydon Avina PT, DPT 12/27/2017, 1:53 PM  [place] MAIN [facility] SERVICES [address] [place], [place], [address] Phone: [phone]   Fax:  [phone]  Name: Jaclyn Hall MRN: [id number] Date of Birth: April 03, 1945

## 2017-12-29 ENCOUNTER — Ambulatory Visit: Payer: BLUE CROSS/BLUE SHIELD

## 2017-12-29 VITALS — BP 175/65 | HR 75

## 2017-12-29 DIAGNOSIS — R262 Difficulty in walking, not elsewhere classified: Secondary | ICD-10-CM | POA: Diagnosis not present

## 2017-12-29 DIAGNOSIS — M6281 Muscle weakness (generalized): Secondary | ICD-10-CM

## 2017-12-29 NOTE — Therapy (Signed)
[place] MAIN [facility] SERVICES [address] [place], [place], [address] Phone: [phone]   Fax:  [phone]  Physical Therapy Treatment/Progress Note  Dates of reporting period  11/28/17   to   12/29/17  Patient Details  Name: Jaclyn Hall MRN: [id number] Date of Birth: 11/29/1945 Referring Provider (PT): Merrilee Seashore, NP   Encounter Date: 12/29/2017  PT End of Session - 12/29/17 1110    Visit Number  19    Number of Visits  25    Date for PT Re-Evaluation  01/19/18    Authorization Type  10/10 progress note (11/28/2017)    PT Start Time  1101    PT Stop Time  1145    PT Time Calculation (min)  44 min    Equipment Utilized During Treatment  Gait belt    Activity Tolerance  Patient tolerated treatment well;No increased pain    Behavior During Therapy  WFL for tasks assessed/performed       Past Medical History:  Diagnosis Date  . Arthritis   . Asthma   . Colon polyps   . Hypertension   . Osteoporosis   . Vertigo     Past Surgical History:  Procedure Laterality Date  . ABDOMINAL HYSTERECTOMY    . CHOLECYSTECTOMY    . COLONOSCOPY    . COLONOSCOPY N/A 11/22/2014   Procedure: COLONOSCOPY;  Surgeon: Lollie Sails, MD;  Location: [facility] ENDOSCOPY;  Service: Endoscopy;  Laterality: N/A;  . OOPHORECTOMY      Vitals:   12/29/17 1125  BP: (!) 175/65  Pulse: 75  SpO2: 100%    Subjective Assessment - 12/29/17 1108    Subjective  Patient reports persistent stiffness in her knees upon arrival but denies pain. Reports no recent falls. No specific questions or concerns.     Patient is accompained by:  Family member   [name]    Pertinent History  Patient is 72 year old female with diagnosis of spinal cord compression at T4/5 s/p laminectomy with decreased mobility and function. PMH significant for hypertension, osteoporosis, arthritis, and BPPV.     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  No/denies         [facility] PT Assessment - 12/29/17 1127      Berg Balance Test   Sit to Stand  Able to stand without using hands and stabilize independently    Standing Unsupported  Able to stand safely 2 minutes    Sitting with Back Unsupported but Feet Supported on Floor or Stool  Able to sit safely and securely 2 minutes    Stand to Sit  Sits safely with minimal use of hands    Transfers  Able to transfer safely, minor use of hands    Standing Unsupported with Eyes Closed  Able to stand 10 seconds with supervision    Standing Ubsupported with Feet Together  Able to place feet together independently and stand 1 minute safely    From Standing, Reach Forward with Outstretched Arm  Can reach confidently >25 cm (10")    From Standing Position, Pick up Object from Floor  Able to pick up shoe, needs supervision    From Standing Position, Turn to Look Behind Over each Shoulder  Turn sideways  only but maintains balance    Turn 360 Degrees  Needs close supervision or verbal cueing    Standing Unsupported, Alternately Place Feet on Step/Stool  Able to complete >2 steps/needs minimal assist    Standing Unsupported, One Foot in Front  Able to plae foot ahead of the other independently and hold 30 seconds    Standing on One Leg  Tries to lift leg/unable to hold 3 seconds but remains standing independently    Total Score  42    Berg comment:  High Fall risk            TREATMENT   Therapeutic Exercise: Warm up on Nustep BUE/BLE level 2 x 5 min during history (3 unbilled); Performed outcome measures with patient including 5TSTS=18.6s, TUG=20.0s, 43mgait speed = self-selected: 18s = 0.56 m/s, fastest: 13.0s = 0.77 m/s, and BERG=, and BERG: 42/56, deferred 6MWT to another visit due to elevated BP; Quantum double leg  press, 140# x 20, 155# x 20, 165# 10 with mod VCs to slow down LE movement especially eccentric return for better motor control and strengthening,patient able to exhibit better control with increased repetition;   Standing: Green tband terminal knee extension 2 x 10 bilaterally; Required mod VCs for correct positioning to improve motor control of knee;   Seated: Clams with red tband 2 x 15 (added to HEP); Marches with red tband 2 x 15 (added to HEP);  Gait training in parallel bars, unsupported x 10 feet forward/backward x 4 lengths with cues to increase arm swing when going forward for better reciprocal gait pattern; patient able to exhibit improved gait mechanics with arm swing; Patient does fatigue after 3rd lap with increased foot drag and knee hyperextension;    Pt educated throughout session about proper posture and technique with exercises. Improved exercise technique, movement at target joints, use of target muscles after min to mod verbal, visual, tactile cues.    Ptcontinues todemonstrate excellent motivation during session and is making great progress with respect to her outcome measures. Her BERG has improved from 23/56 initially to 42/56 today. Her Five Time Sit to Stand improved from 29 seconds initially to 18.6 seconds today and her TUG decreased from 43 seconds to 20 seconds. Her strength on the leg press has also improved significantly but is still limited. She continues to require use of walker for ambulation secondary to buckling. Will perform Six Minute Walk Test at follow-up appointment to have an objective measures of distance for ambulation.Pt encouraged to continue HEPandfollow-up as scheduled.Pt presents withdeficits in strength, activity tolerance, motor control, and balance and will continue to benefit from skilled therapeutic intervention.                        PT Short Term Goals - 12/29/17 1114      PT SHORT TERM GOAL #1   Title   Patient will be adherent to HEP at least 3x a week to improve functional strength in BLE and balance in order to decrease risk of falls and maintain safety at home.    Baseline  Walking supported, sit <>stands performed most days per patient reports    Time  3    Period  Weeks    Status  On-going    Target Date  11/17/17      PT SHORT TERM GOAL #2   Title  Patient will complete five times sit to stand test in < 24 seconds (2 MCIDs) indicating increased  LE strength and improved balance in order to improve mobility and decrease risk of falls.    Baseline  (10/26/2017) 29 sec; (11/28/2017) 26.8 sec; 12/29/17: 18.6 sec;    Time  3    Period  Weeks    Status  Achieved      PT SHORT TERM GOAL #3   Title  Patient will ambulate with RW, SBA, on level surfaces for >150 feet with no LOB and no sitting breaks in order to demonstrate increased independence and improved mobility.    Baseline  (10/26/2017) <25 feet, (11/28/2017), 375 ft    Time  3    Period  Weeks    Status  Achieved        PT Long Term Goals - 12/30/17 1328      PT LONG TERM GOAL #1   Title  Patient will increase Berg Balance score to 46/56 to demonstrate decreased fall risk during functional activities and maintain safety.    Baseline  (10/26/2017) 23/56; 11/28/2017: 39/56; 12/29/17: 42/56    Time  8    Period  Weeks    Status  On-going    Target Date  12/22/17      PT LONG TERM GOAL #2   Title  Patient will complete five times sit to stand test in < 15 seconds (2 MCIDs) indicating increased LE strength and improved balance in order to improve mobility and decrease risk of falls.    Baseline  (10/26/2017) 29 sec; (11/28/2017) 26.8 sec; 12/29/17: 18.6s    Time  12    Period  Weeks    Status  On-going    Target Date  01/19/18      PT LONG TERM GOAL #3   Title  Patient will be independent in bending down towards floor and picking up small object (<5 pounds) and then stand back up without loss of balance in order to return to  PLOF and meet demands at work.    Baseline  (10/26/2017) not assessed 2/2 BLT precautions; 11/28/2017: able to pick up a cone w/o UE support, no loss of balance    Time  12    Period  Weeks    Status  Achieved      PT LONG TERM GOAL #4   Title  Patient will reduce timed up and go to <11 seconds to reduce fall risk and demonstrate improved transfer/gait ability.    Baseline  10/26/2017: not assessed 2/2 to fatigue/endurance; 11/02/17: 43 seconds; 11/28/2017: 25.1 seconds (RW); 12/29/17: 20.0s    Time  12    Period  Weeks    Status  Partially Met    Target Date  01/19/17      PT LONG TERM GOAL #5   Title  Pt will increase 10MWT by at least 0.13 m/s in order to demonstrate clinically significant improvement in community ambulation.     Baseline  12/29/17: self-selected: 18s = 0.56 m/s, fastest: 13.0s = 0.77 m/s    Time  12    Period  Weeks    Status  New    Target Date  01/19/17            Plan - 12/29/17 1111    Clinical Impression Statement  Ptcontinues todemonstrate excellent motivation during session and is making great progress with respect to her outcome measures. Her BERG has improved from 23/56 initially to 42/56 today. Her Five Time Sit to Stand improved from 29 seconds initially to 18.6 seconds today and her TUG  decreased from 43 seconds to 20 seconds. Her strength on the leg press has also improved significantly but is still limited. She continues to require use of walker for ambulation secondary to buckling. Will perform Six Minute Walk Test at follow-up appointment to have an objective measures of distance for ambulation.Pt encouraged to continue HEPandfollow-up as scheduled.Pt presents withdeficits in strength, activity tolerance, motor control, and balance and will continue to benefit from skilled therapeutic intervention.    Rehab Potential  Good    PT Frequency  2x / week    PT Duration  12 weeks    PT Treatment/Interventions  Cryotherapy;Moist  Heat;Ultrasound;Gait training;Stair training;Functional mobility training;Therapeutic activities;Therapeutic exercise;Balance training;Neuromuscular re-education;Patient/family education;Orthotic Fit/Training;Manual techniques;Scar mobilization;Dry needling;Energy conservation;Electrical Stimulation    PT Next Visit Plan  BLE strengthening, balance    PT Home Exercise Plan  glute sets, LAQ, sit-to-stand with supervision and RW, standing hip aBduction (HEP provided at hospital d/c)    Consulted and Agree with Plan of Care  Patient;Family member/caregiver    Family Member Consulted  Spouse       Patient will benefit from skilled therapeutic intervention in order to improve the following deficits and impairments:  Abnormal gait, Decreased endurance, Impaired sensation, Decreased activity tolerance, Decreased strength, Pain, Decreased balance, Decreased mobility, Difficulty walking, Improper body mechanics, Decreased range of motion, Decreased coordination, Postural dysfunction  Visit Diagnosis: Difficulty in walking, not elsewhere classified  Muscle weakness (generalized)     Problem List Patient Active Problem List   Diagnosis Date Noted  . Allergic state 03/14/2017  . Arthritis 03/14/2017  . Hypertension 03/14/2017  . Osteoporosis, post-menopausal 03/14/2017  . Benign paroxysmal positional vertigo due to bilateral vestibular disorder 06/30/2015   Phillips Grout PT, DPT, GCS  Asma Boldon 12/30/2017, 1:39 PM  [place] MAIN [facility] SERVICES [address] [place], [place], [address] Phone: [phone]   Fax:  [phone]  Name: Jaclyn Hall MRN: [id number] Date of Birth: February 13, 1945

## 2018-01-03 ENCOUNTER — Ambulatory Visit: Payer: BLUE CROSS/BLUE SHIELD

## 2018-01-03 DIAGNOSIS — R262 Difficulty in walking, not elsewhere classified: Secondary | ICD-10-CM

## 2018-01-03 DIAGNOSIS — M6281 Muscle weakness (generalized): Secondary | ICD-10-CM

## 2018-01-03 NOTE — Therapy (Signed)
[place] MAIN [facility] SERVICES [address] [place], [place], [address] Phone: [phone]   Fax:  [phone]  Physical Therapy Treatment  Patient Details  Name: Jaclyn Hall MRN: [id number] Date of Birth: [birth year] Referring Provider (PT): Merrilee Seashore, NP   Encounter Date: 01/03/2018  PT End of Session - 01/03/18 1119    Visit Number  20    Number of Visits  25    Date for PT Re-Evaluation  01/19/18    Authorization Type  1/10 progress note, last goals: 12/29/17    PT Start Time  1115    PT Stop Time  1200    PT Time Calculation (min)  45 min    Equipment Utilized During Treatment  Gait belt    Activity Tolerance  Patient tolerated treatment well;No increased pain    Behavior During Therapy  WFL for tasks assessed/performed       Past Medical History:  Diagnosis Date  . Arthritis   . Asthma   . Colon polyps   . Hypertension   . Osteoporosis   . Vertigo     Past Surgical History:  Procedure Laterality Date  . ABDOMINAL HYSTERECTOMY    . CHOLECYSTECTOMY    . COLONOSCOPY    . COLONOSCOPY N/A 11/22/2014   Procedure: COLONOSCOPY;  Surgeon: Lollie Sails, MD;  Location: [facility] ENDOSCOPY;  Service: Endoscopy;  Laterality: N/A;  . OOPHORECTOMY      There were no vitals filed for this visit.  Subjective Assessment - 01/03/18 1118    Subjective  Patient continues to complain of persistent stiffness in her knees upon arrival but denies pain. She was having pain previously so she contacted her MD who called in a prescription for Gabapentin. However pt reports that the medication makes her legs feel week. Reports no recent falls. No specific questions or concerns.     Patient is accompained by:  Family member   [name]    Pertinent History  Patient is 72 year old female with diagnosis of spinal cord compression at T4/5 s/p laminectomy with decreased mobility and function. PMH significant for hypertension, osteoporosis, arthritis, and  BPPV.     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  No/denies            TREATMENT   Therapeutic Exercise: NuStep HIIT L6 x 30s, L3 x 60s for multiple bouts, 5 minutes total with 1 minute cooldown. Fatigue monitored and resistance adjusted. History obtained; Hooklying SLR 2 x 10 bilateral; Hooklying bridges with 3s hold 2 x 10; Supine SLR hip abduction with manual resistance 2 x 10 bilateral; Hooklying clams with manual resistance 2 x 10; Hooklying adductor squeeze with manual resistance 2 x 10; Hooklying resisted trunk rotation 3s hold x 10 each direction, resistance provided at the knees; Seated Pallof press with green tband x 10 each direction; Sit to stand without UE support from mat table x 10; Gait trainingin parallel bars, unsupported x 10 feet forward/backward x 4 lengths with cues to increase arm swing when going forward for better reciprocal gait pattern; patient able to exhibit improved gait mechanics with arm swing; Standing TKE with green tband x 10 bilateral;   Pt educated throughout session about proper posture and technique with exercises. Improved  exercise technique, movement at target joints, use of target muscles after min to mod verbal, visual, tactile cues.    Ptcontinues todemonstrate excellent motivation during session and is making excellent progress. Focused on supine exercises today due to reported increased stiffness in knees today. She continues to demonstrate considerable LE weakness although it is improving. Will perform 6MWT at follow-up appointment as pt is able. Pt encouraged to continue HEPandfollow-up as scheduled.Pt presents withdeficits in strength, activity tolerance, motor control, and balance and will continue to  benefit from skilled therapeutic intervention.                      PT Short Term Goals - 12/29/17 1114      PT SHORT TERM GOAL #1   Title  Patient will be adherent to HEP at least 3x a week to improve functional strength in BLE and balance in order to decrease risk of falls and maintain safety at home.    Baseline  Walking supported, sit <>stands performed most days per patient reports    Time  3    Period  Weeks    Status  On-going    Target Date  11/17/17      PT SHORT TERM GOAL #2   Title  Patient will complete five times sit to stand test in < 24 seconds (2 MCIDs) indicating increased LE strength and improved balance in order to improve mobility and decrease risk of falls.    Baseline  (10/26/2017) 29 sec; (11/28/2017) 26.8 sec; 12/29/17: 18.6 sec;    Time  3    Period  Weeks    Status  Achieved      PT SHORT TERM GOAL #3   Title  Patient will ambulate with RW, SBA, on level surfaces for >150 feet with no LOB and no sitting breaks in order to demonstrate increased independence and improved mobility.    Baseline  (10/26/2017) <25 feet, (11/28/2017), 375 ft    Time  3    Period  Weeks    Status  Achieved        PT Long Term Goals - 12/30/17 1328      PT LONG TERM GOAL #1   Title  Patient will increase Berg Balance score to 46/56 to demonstrate decreased fall risk during functional activities and maintain safety.    Baseline  (10/26/2017) 23/56; 11/28/2017: 39/56; 12/29/17: 42/56    Time  8    Period  Weeks    Status  On-going    Target Date  12/22/17      PT LONG TERM GOAL #2   Title  Patient will complete five times sit to stand test in < 15 seconds (2 MCIDs) indicating increased LE strength and improved balance in order to improve mobility and decrease risk of falls.    Baseline  (10/26/2017) 29 sec; (11/28/2017) 26.8 sec; 12/29/17: 18.6s    Time  12    Period  Weeks    Status  On-going    Target Date  01/19/18      PT LONG TERM GOAL #3   Title   Patient will be independent in bending down towards floor and picking up small object (<5 pounds) and then stand back up without loss of balance in order to return to PLOF and meet demands at work.    Baseline  (10/26/2017) not assessed 2/2 BLT precautions; 11/28/2017: able to pick up a cone w/o UE support, no loss of balance  Time  12    Period  Weeks    Status  Achieved      PT LONG TERM GOAL #4   Title  Patient will reduce timed up and go to <11 seconds to reduce fall risk and demonstrate improved transfer/gait ability.    Baseline  10/26/2017: not assessed 2/2 to fatigue/endurance; 11/02/17: 43 seconds; 11/28/2017: 25.1 seconds (RW); 12/29/17: 20.0s    Time  12    Period  Weeks    Status  Partially Met    Target Date  01/19/17      PT LONG TERM GOAL #5   Title  Pt will increase 10MWT by at least 0.13 m/s in order to demonstrate clinically significant improvement in community ambulation.     Baseline  12/29/17: self-selected: 18s = 0.56 m/s, fastest: 13.0s = 0.77 m/s    Time  12    Period  Weeks    Status  New    Target Date  01/19/17            Plan - 01/03/18 1120    Clinical Impression Statement  Ptcontinues todemonstrate excellent motivation during session and is making excellent progress. Focused on supine exercises today due to reported increased stiffness in knees today. She continues to demonstrate considerable LE weakness although it is improving. Will perform 6MWT at follow-up appointment as pt is able. Pt encouraged to continue HEPandfollow-up as scheduled.Pt presents withdeficits in strength, activity tolerance, motor control, and balance and will continue to benefit from skilled therapeutic intervention    Rehab Potential  Good    PT Frequency  2x / week    PT Duration  12 weeks    PT Treatment/Interventions  Cryotherapy;Moist Heat;Ultrasound;Gait training;Stair training;Functional mobility training;Therapeutic activities;Therapeutic exercise;Balance  training;Neuromuscular re-education;Patient/family education;Orthotic Fit/Training;Manual techniques;Scar mobilization;Dry needling;Energy conservation;Electrical Stimulation    PT Next Visit Plan  BLE strengthening, balance    PT Home Exercise Plan  glute sets, LAQ, sit-to-stand with supervision and RW, standing hip aBduction (HEP provided at hospital d/c)    Consulted and Agree with Plan of Care  Patient;Family member/caregiver    Family Member Consulted  Spouse       Patient will benefit from skilled therapeutic intervention in order to improve the following deficits and impairments:  Abnormal gait, Decreased endurance, Impaired sensation, Decreased activity tolerance, Decreased strength, Pain, Decreased balance, Decreased mobility, Difficulty walking, Improper body mechanics, Decreased range of motion, Decreased coordination, Postural dysfunction  Visit Diagnosis: Difficulty in walking, not elsewhere classified  Muscle weakness (generalized)     Problem List Patient Active Problem List   Diagnosis Date Noted  . Allergic state 03/14/2017  . Arthritis 03/14/2017  . Hypertension 03/14/2017  . Osteoporosis, post-menopausal 03/14/2017  . Benign paroxysmal positional vertigo due to bilateral vestibular disorder 06/30/2015   Phillips Grout PT, DPT, GCS  Coralee Edberg 01/03/2018, 4:31 PM  [place] MAIN [facility] SERVICES [address] [place], [place], [address] Phone: [phone]   Fax:  [phone]  Name: BRALEE FELDT MRN: [id number] Date of Birth: 03-18-1945

## 2018-01-05 ENCOUNTER — Ambulatory Visit: Payer: BLUE CROSS/BLUE SHIELD

## 2018-01-05 DIAGNOSIS — R262 Difficulty in walking, not elsewhere classified: Secondary | ICD-10-CM

## 2018-01-05 DIAGNOSIS — M6281 Muscle weakness (generalized): Secondary | ICD-10-CM

## 2018-01-05 NOTE — Therapy (Signed)
[place] MAIN [facility] SERVICES [address] [place], [place], [address] Phone: [phone]   Fax:  [phone]  Physical Therapy Treatment  Patient Details  Name: Jaclyn Hall MRN: [id number] Date of Birth: 02-27-1945 Referring Provider (PT): Merrilee Seashore, NP   Encounter Date: 01/05/2018  PT End of Session - 01/05/18 1441    Visit Number  21    Number of Visits  25    Date for PT Re-Evaluation  01/19/18    Authorization Type  2/10 progress note, last goals: 12/29/17    PT Start Time  1430    PT Stop Time  1515    PT Time Calculation (min)  45 min    Equipment Utilized During Treatment  Gait belt    Activity Tolerance  Patient tolerated treatment well    Behavior During Therapy  [facility] for tasks assessed/performed       Past Medical History:  Diagnosis Date  . Arthritis   . Asthma   . Colon polyps   . Hypertension   . Osteoporosis   . Vertigo     Past Surgical History:  Procedure Laterality Date  . ABDOMINAL HYSTERECTOMY    . CHOLECYSTECTOMY    . COLONOSCOPY    . COLONOSCOPY N/A 11/22/2014   Procedure: COLONOSCOPY;  Surgeon: Lollie Sails, MD;  Location: [facility] ENDOSCOPY;  Service: Endoscopy;  Laterality: N/A;  . OOPHORECTOMY      There were no vitals filed for this visit.  Subjective Assessment - 01/05/18 1440    Subjective  Pt reports that she is doing alright today. She was at her neices daycare this morning and had to get up from a low commode and it bothered her L knee. However she is not having any pain right now. No specific questions or concerns at this time.     Patient is accompained by:  Family member   Jaclyn Hall    Pertinent History  Patient is 72 year old female with diagnosis of spinal cord compression at T4/5 s/p laminectomy with decreased mobility and function. PMH significant for hypertension, osteoporosis, arthritis, and BPPV.     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  No/denies          TREATMENT   Therapeutic Exercise: NuStep L1-L6 for 5 minutes with 1 minute cool down. Fatigue monitored and resistance adjusted. History obtained; Quantum double leg press 165# 2 x 15 with mod VCs to slow down LE movement especially eccentric return for better motor control and strengthening,patient able to exhibit better control with increased repetition; Quantum double leg heel raise 90# x 15, 105# x 15; Gait in // bars without UE support 4 lengths forwards, 4 lengths backwards, and 4 lengths sidestepping (2 each direction), CGA throughout with cues for step length; Standing mini squats x 10 with BUE support; Standing TKE with green tband x 10 bilateral; Seated clams 2 x 10 bilateral; Seated adductor squeeze 2 x 10 bilateral; Seated LAQ with manual resistance x 10;   Pt educated throughout session about proper posture and technique with exercises. Improved exercise technique, movement at target joints, use of target muscles after min to mod verbal, visual, tactile cues.   Ptcontinues todemonstrate excellent motivation during sessionand is making excellent progress. Continued  gait training in parallel bars without UE support. Seated and standing exercises added back today due to minimal knee pain. She continues to demonstrate considerable LE weakness although it is improving. Will perform 6MWT at follow-up appointment as pt is able and add to goals if it is an appropriate metric. Pt encouraged to continue HEPandfollow-up as scheduled.Pt presents withdeficits in strength, activity tolerance, motor control, and balance and will continue to benefit from skilled therapeutic intervention.                  PT Short Term Goals - 12/29/17 1114      PT SHORT TERM  GOAL #1   Title  Patient will be adherent to HEP at least 3x a week to improve functional strength in BLE and balance in order to decrease risk of falls and maintain safety at home.    Baseline  Walking supported, sit <>stands performed most days per patient reports    Time  3    Period  Weeks    Status  On-going    Target Date  11/17/17      PT SHORT TERM GOAL #2   Title  Patient will complete five times sit to stand test in < 24 seconds (2 MCIDs) indicating increased LE strength and improved balance in order to improve mobility and decrease risk of falls.    Baseline  (10/26/2017) 29 sec; (11/28/2017) 26.8 sec; 12/29/17: 18.6 sec;    Time  3    Period  Weeks    Status  Achieved      PT SHORT TERM GOAL #3   Title  Patient will ambulate with RW, SBA, on level surfaces for >150 feet with no LOB and no sitting breaks in order to demonstrate increased independence and improved mobility.    Baseline  (10/26/2017) <25 feet, (11/28/2017), 375 ft    Time  3    Period  Weeks    Status  Achieved        PT Long Term Goals - 12/30/17 1328      PT LONG TERM GOAL #1   Title  Patient will increase Berg Balance score to 46/56 to demonstrate decreased fall risk during functional activities and maintain safety.    Baseline  (10/26/2017) 23/56; 11/28/2017: 39/56; 12/29/17: 42/56    Time  8    Period  Weeks    Status  On-going    Target Date  12/22/17      PT LONG TERM GOAL #2   Title  Patient will complete five times sit to stand test in < 15 seconds (2 MCIDs) indicating increased LE strength and improved balance in order to improve mobility and decrease risk of falls.    Baseline  (10/26/2017) 29 sec; (11/28/2017) 26.8 sec; 12/29/17: 18.6s    Time  12    Period  Weeks    Status  On-going    Target Date  01/19/18      PT LONG TERM GOAL #3   Title  Patient will be independent in bending down towards floor and picking up small object (<5 pounds) and then stand back up without loss of balance in  order to return to PLOF and meet demands at work.    Baseline  (10/26/2017) not assessed 2/2 BLT precautions; 11/28/2017: able to pick up a cone w/o UE support, no loss of balance    Time  12    Period  Weeks    Status  Achieved  PT LONG TERM GOAL #4   Title  Patient will reduce timed up and go to <11 seconds to reduce fall risk and demonstrate improved transfer/gait ability.    Baseline  10/26/2017: not assessed 2/2 to fatigue/endurance; 11/02/17: 43 seconds; 11/28/2017: 25.1 seconds (RW); 12/29/17: 20.0s    Time  12    Period  Weeks    Status  Partially Met    Target Date  01/19/17      PT LONG TERM GOAL #5   Title  Pt will increase 10MWT by at least 0.13 m/s in order to demonstrate clinically significant improvement in community ambulation.     Baseline  12/29/17: self-selected: 18s = 0.56 m/s, fastest: 13.0s = 0.77 m/s    Time  12    Period  Weeks    Status  New    Target Date  01/19/17            Plan - 01/05/18 1442    Clinical Impression Statement  Ptcontinues todemonstrate excellent motivation during sessionand is making excellent progress. Continued gait training in parallel bars without UE support. Seated and standing exercises added back today due to minimal knee pain. She continues to demonstrate considerable LE weakness although it is improving. Will perform 6MWT at follow-up appointment as pt is able and add to goals if it is an appropriate metric. Pt encouraged to continue HEPandfollow-up as scheduled.Pt presents withdeficits in strength, activity tolerance, motor control, and balance and will continue to benefit from skilled therapeutic intervention.    Rehab Potential  Good    PT Frequency  2x / week    PT Duration  12 weeks    PT Treatment/Interventions  Cryotherapy;Moist Heat;Ultrasound;Gait training;Stair training;Functional mobility training;Therapeutic activities;Therapeutic exercise;Balance training;Neuromuscular re-education;Patient/family  education;Orthotic Fit/Training;Manual techniques;Scar mobilization;Dry needling;Energy conservation;Electrical Stimulation    PT Next Visit Plan  BLE strengthening, balance    PT Home Exercise Plan  glute sets, LAQ, sit-to-stand with supervision and RW, standing hip aBduction (HEP provided at hospital d/c)    Consulted and Agree with Plan of Care  Patient;Family member/caregiver    Family Member Consulted  Spouse       Patient will benefit from skilled therapeutic intervention in order to improve the following deficits and impairments:  Abnormal gait, Decreased endurance, Impaired sensation, Decreased activity tolerance, Decreased strength, Pain, Decreased balance, Decreased mobility, Difficulty walking, Improper body mechanics, Decreased range of motion, Decreased coordination, Postural dysfunction  Visit Diagnosis: Difficulty in walking, not elsewhere classified  Muscle weakness (generalized)     Problem List Patient Active Problem List   Diagnosis Date Noted  . Allergic state 03/14/2017  . Arthritis 03/14/2017  . Hypertension 03/14/2017  . Osteoporosis, post-menopausal 03/14/2017  . Benign paroxysmal positional vertigo due to bilateral vestibular disorder 06/30/2015    Jaclyn Hall PT, DPT, GCS  Jaclyn Hall 01/06/2018, 8:43 AM  [place] MAIN [facility] SERVICES [address] [place], [place], [address] Phone: [phone]   Fax:  [phone]  Name: Jaclyn Hall MRN: [id number] Date of Birth: 27-Apr-1945

## 2018-01-12 ENCOUNTER — Encounter: Payer: Self-pay | Admitting: Physical Therapy

## 2018-01-12 ENCOUNTER — Ambulatory Visit: Payer: BLUE CROSS/BLUE SHIELD | Admitting: Physical Therapy

## 2018-01-12 DIAGNOSIS — R262 Difficulty in walking, not elsewhere classified: Secondary | ICD-10-CM

## 2018-01-12 DIAGNOSIS — M6281 Muscle weakness (generalized): Secondary | ICD-10-CM

## 2018-01-12 NOTE — Therapy (Signed)
[place] MAIN [facility] SERVICES [address] [place], [place], [address] Phone: [phone]   Fax:  [phone]  Physical Therapy Treatment  Patient Details  Name: Jaclyn Hall MRN: [id number] Date of Birth: [birth year] Referring Provider (PT): Merrilee Seashore, NP   Encounter Date: 01/12/2018  PT End of Session - 01/12/18 1110    Visit Number  22    Number of Visits  25    Date for PT Re-Evaluation  01/19/18    Authorization Type  3/10 progress note, last goals: 12/29/17    PT Start Time  1115    PT Stop Time  1200    PT Time Calculation (min)  45 min    Equipment Utilized During Treatment  Gait belt    Activity Tolerance  Patient tolerated treatment well    Behavior During Therapy  [facility] for tasks assessed/performed       Past Medical History:  Diagnosis Date  . Arthritis   . Asthma   . Colon polyps   . Hypertension   . Osteoporosis   . Vertigo     Past Surgical History:  Procedure Laterality Date  . ABDOMINAL HYSTERECTOMY    . CHOLECYSTECTOMY    . COLONOSCOPY    . COLONOSCOPY N/A 11/22/2014   Procedure: COLONOSCOPY;  Surgeon: Lollie Sails, MD;  Location: [facility] ENDOSCOPY;  Service: Endoscopy;  Laterality: N/A;  . OOPHORECTOMY      There were no vitals filed for this visit.  Subjective Assessment - 01/12/18 1119    Subjective  Patient states that she is feeling really stiff and sore today. Her legs are hurting from the knee and radiating up to the groin and glute region (L>R). Patient also reports increased discomfort in her back. Patient reports no falls.     Patient is accompained by:  Family member   [name]    Pertinent History  Patient is 72 year old female with diagnosis of spinal cord compression at T4/5 s/p laminectomy with decreased mobility and function. PMH significant for hypertension, osteoporosis, arthritis, and BPPV.     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  Yes    Pain Score  5     Pain Location  Knee    Pain Orientation  Left;Right    Pain Descriptors / Indicators  Tightness;Burning       Therapeutic Exercise: NuStep L3 for 5 minutes with 1 minute cool down. Fatigue monitored and resistance adjusted. History obtained;  Hallway ambulation x200 feet, min VCs for R foot clearance, CGA Quantum double leg press 135# 3 x 10with mod VCs to slow down LE movement especially eccentric return for better motor control and strengthening,patient able to exhibit better control with increased repetition; Quantum double leg heel raise 90# 2 x 15, Seated clams 2 x 10 bilateral; Seated adductor squeeze 2 x 10 bilateral; Supine TrA activation x10 Supine dead bug (UE only) x5 each Supine TrA w/ heel slides x5 each    PT Education - 01/12/18 1110    Education Details  exercise technique    Person(s) Educated  Patient    Methods  Explanation;Verbal cues    Comprehension  Verbalized understanding;Need further instruction;Returned demonstration       PT Short Term Goals -  12/29/17 1114      PT SHORT TERM GOAL #1   Title  Patient will be adherent to HEP at least 3x a week to improve functional strength in BLE and balance in order to decrease risk of falls and maintain safety at home.    Baseline  Walking supported, sit <>stands performed most days per patient reports    Time  3    Period  Weeks    Status  On-going    Target Date  11/17/17      PT SHORT TERM GOAL #2   Title  Patient will complete five times sit to stand test in < 24 seconds (2 MCIDs) indicating increased LE strength and improved balance in order to improve mobility and decrease risk of falls.    Baseline  (10/26/2017) 29 sec; (11/28/2017) 26.8 sec; 12/29/17: 18.6 sec;    Time  3    Period  Weeks    Status  Achieved       PT SHORT TERM GOAL #3   Title  Patient will ambulate with RW, SBA, on level surfaces for >150 feet with no LOB and no sitting breaks in order to demonstrate increased independence and improved mobility.    Baseline  (10/26/2017) <25 feet, (11/28/2017), 375 ft    Time  3    Period  Weeks    Status  Achieved        PT Long Term Goals - 12/30/17 1328      PT LONG TERM GOAL #1   Title  Patient will increase Berg Balance score to 46/56 to demonstrate decreased fall risk during functional activities and maintain safety.    Baseline  (10/26/2017) 23/56; 11/28/2017: 39/56; 12/29/17: 42/56    Time  8    Period  Weeks    Status  On-going    Target Date  12/22/17      PT LONG TERM GOAL #2   Title  Patient will complete five times sit to stand test in < 15 seconds (2 MCIDs) indicating increased LE strength and improved balance in order to improve mobility and decrease risk of falls.    Baseline  (10/26/2017) 29 sec; (11/28/2017) 26.8 sec; 12/29/17: 18.6s    Time  12    Period  Weeks    Status  On-going    Target Date  01/19/18      PT LONG TERM GOAL #3   Title  Patient will be independent in bending down towards floor and picking up small object (<5 pounds) and then stand back up without loss of balance in order to return to PLOF and meet demands at work.    Baseline  (10/26/2017) not assessed 2/2 BLT precautions; 11/28/2017: able to pick up a cone w/o UE support, no loss of balance    Time  12    Period  Weeks    Status  Achieved      PT LONG TERM GOAL #4   Title  Patient will reduce timed up and go to <11 seconds to reduce fall risk and demonstrate improved transfer/gait ability.    Baseline  10/26/2017: not assessed 2/2 to fatigue/endurance; 11/02/17: 43 seconds; 11/28/2017: 25.1 seconds (RW); 12/29/17: 20.0s    Time  12    Period  Weeks    Status  Partially Met    Target Date  01/19/17      PT LONG TERM GOAL #5   Title  Pt will increase 10MWT by at least 0.13 m/s in  order to  demonstrate clinically significant improvement in community ambulation.     Baseline  12/29/17: self-selected: 18s = 0.56 m/s, fastest: 13.0s = 0.77 m/s    Time  12    Period  Weeks    Status  New    Target Date  01/19/17            Plan - 01/12/18 1247    Clinical Impression Statement  Patient presents to clinic with increased fatigue and stiffness, but is amenable to therapy. Patient demonstrates greater ability to clear R foot during swing phase of gait on a carpeted surface, but continues to have deficits in motor control and strength in BLE during WB activities. Patient will continue to benefit from skilled therapeutic intervention to address deficits in strength, activity tolerance, motor control, and balance.     Rehab Potential  Good    PT Frequency  2x / week    PT Duration  12 weeks    PT Treatment/Interventions  Cryotherapy;Moist Heat;Ultrasound;Gait training;Stair training;Functional mobility training;Therapeutic activities;Therapeutic exercise;Balance training;Neuromuscular re-education;Patient/family education;Orthotic Fit/Training;Manual techniques;Scar mobilization;Dry needling;Energy conservation;Electrical Stimulation    PT Next Visit Plan  BLE strengthening, balance    PT Home Exercise Plan  glute sets, LAQ, sit-to-stand with supervision and RW, standing hip aBduction (HEP provided at hospital d/c)    Consulted and Agree with Plan of Care  Patient;Family member/caregiver    Family Member Consulted  Spouse       Patient will benefit from skilled therapeutic intervention in order to improve the following deficits and impairments:  Abnormal gait, Decreased endurance, Impaired sensation, Decreased activity tolerance, Decreased strength, Pain, Decreased balance, Decreased mobility, Difficulty walking, Improper body mechanics, Decreased range of motion, Decreased coordination, Postural dysfunction  Visit Diagnosis: Difficulty in walking, not elsewhere classified  Muscle  weakness (generalized)     Problem List Patient Active Problem List   Diagnosis Date Noted  . Allergic state 03/14/2017  . Arthritis 03/14/2017  . Hypertension 03/14/2017  . Osteoporosis, post-menopausal 03/14/2017  . Benign paroxysmal positional vertigo due to bilateral vestibular disorder 06/30/2015   Myles Gip PT, DPT [phone] 01/12/2018, 12:53 PM  [place] MAIN [facility] SERVICES [address] [place], [place], [address] Phone: [phone]   Fax:  [phone]  Name: PRANIKA FINKS MRN: [id number] Date of Birth: 11-Nov-1945

## 2018-01-16 ENCOUNTER — Ambulatory Visit: Payer: BLUE CROSS/BLUE SHIELD

## 2018-01-16 DIAGNOSIS — R262 Difficulty in walking, not elsewhere classified: Secondary | ICD-10-CM

## 2018-01-16 DIAGNOSIS — M6281 Muscle weakness (generalized): Secondary | ICD-10-CM

## 2018-01-16 NOTE — Therapy (Signed)
[place] MAIN [facility] SERVICES [address] [place], [place], [address] Phone: [phone]   Fax:  [phone]  Physical Therapy Treatment/Recertification  Patient Details  Name: Jaclyn Hall MRN: [id number] Date of Birth: [birth year] Referring Provider (PT): Merrilee Seashore, NP   Encounter Date: 01/16/2018  PT End of Session - 01/16/18 1411    Visit Number  23    Number of Visits  51    Date for PT Re-Evaluation  04/10/18    Authorization Type  4/10 progress note, last goals: 12/29/17    PT Start Time  1310    PT Stop Time  1350    PT Time Calculation (min)  40 min    Equipment Utilized During Treatment  Gait belt    Activity Tolerance  Patient tolerated treatment well    Behavior During Therapy  [facility] for tasks assessed/performed       Past Medical History:  Diagnosis Date  . Arthritis   . Asthma   . Colon polyps   . Hypertension   . Osteoporosis   . Vertigo     Past Surgical History:  Procedure Laterality Date  . ABDOMINAL HYSTERECTOMY    . CHOLECYSTECTOMY    . COLONOSCOPY    . COLONOSCOPY N/A 11/22/2014   Procedure: COLONOSCOPY;  Surgeon: Lollie Sails, MD;  Location: [facility] ENDOSCOPY;  Service: Endoscopy;  Laterality: N/A;  . OOPHORECTOMY      There were no vitals filed for this visit.  Subjective Assessment - 01/16/18 1312    Subjective  Patient states that she is feeling really stiff in her knees, feet, and hips. She states that she took an Aleve this morning and is currently not having any pain. No specific questions or concerns at this time.     Patient is accompained by:  Family member   Jaclyn Hall    Pertinent History  Patient is 72 year old female with diagnosis of spinal cord compression at T4/5 s/p laminectomy with decreased mobility and function. PMH significant for hypertension, osteoporosis, arthritis, and BPPV.     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  No/denies            TREATMENT   Therapeutic Exercise: Hooklying SLR 2 x 10 bilateral; Hooklying bridges with 3s hold 2 x 10; Supine SLR hip abduction with manual resistance 2 x 10 bilateral; Hooklying clams with manual resistance 2 x 10; Hooklying adductor squeeze with manual resistance 2 x 10; Hooklying resisted trunk rotation 3s hold 2 x 10 each direction, resistance provided at the knees; Hooklying pball press down for abdominal strengthening 5s hold x 10; Sit to stand without UE support from mat table x 10, added 4# medicine ball x 10; Gait trainingwith lofstrand curtches 100' x 2 around the gym and then an additional 100' to the waiting room, pt requires some cues initially but is able to demonstrate good technique with crutches relatively quickly, infrequent buckling but pt is able to self-correct;   Pt educated throughout session about proper posture and technique with exercises. Improved exercise technique, movement at target joints, use of target muscles after min to mod verbal, visual, tactile cues.   Ptcontinues todemonstrate excellent motivation during sessionand is making excellent progress. Focused on supine exercises  today due to reported increased stiffness in knees today. She continues to demonstrate considerable LE weakness although it is improving. She is making great progress with respect to her outcome measures when they were last tested on 12/29/17. Her BERG has improved from 23/56 initially to 42/56 and her Five Time Sit to Stand improved from 29 seconds initially to 18.6 seconds. Her TUG decreased from 43 seconds to 20 seconds. Her strength on the leg press has also improved significantly. She continues to require use of walker for ambulation but demonstrates good stability/safety with  practice today using lofstrands crutches. Pt encouraged to continue HEPandfollow-up as scheduled.Pt presents withdeficits in strength, activity tolerance, motor control, and balance and will continue to benefit from skilled therapeutic intervention.                     PT Short Term Goals - 01/16/18 1413      PT SHORT TERM GOAL #1   Title  Patient will be adherent to HEP at least 3x a week to improve functional strength in BLE and balance in order to decrease risk of falls and maintain safety at home.    Baseline  Walking supported, sit <>stands performed most days per patient reports    Time  3    Period  Weeks    Status  On-going    Target Date  02/27/18      PT SHORT TERM GOAL #2   Title  Patient will complete five times sit to stand test in < 24 seconds (2 MCIDs) indicating increased LE strength and improved balance in order to improve mobility and decrease risk of falls.    Baseline  (10/26/2017) 29 sec; (11/28/2017) 26.8 sec; 12/29/17: 18.6 sec;    Time  3    Period  Weeks    Status  Achieved      PT SHORT TERM GOAL #3   Title  Patient will ambulate with RW, SBA, on level surfaces for >150 feet with no LOB and no sitting breaks in order to demonstrate increased independence and improved mobility.    Baseline  (10/26/2017) <25 feet, (11/28/2017), 375 ft    Time  3    Period  Weeks    Status  Achieved        PT Long Term Goals - 01/16/18 1413      PT LONG TERM GOAL #1   Title  Patient will increase Berg Balance score to 46/56 to demonstrate decreased fall risk during functional activities and maintain safety.    Baseline  (10/26/2017) 23/56; 11/28/2017: 39/56; 12/29/17: 42/56    Time  8    Period  Weeks    Status  On-going    Target Date  04/10/18      PT LONG TERM GOAL #2   Title  Patient will complete five times sit to stand test in < 15 seconds (2 MCIDs) indicating increased LE strength and improved balance in order to improve mobility and decrease  risk of falls.    Baseline  (10/26/2017) 29 sec; (11/28/2017) 26.8 sec; 12/29/17: 18.6s    Time  12    Period  Weeks    Status  On-going    Target Date  04/10/18      PT LONG TERM GOAL #3   Title  Patient will be independent in bending down towards floor and picking up small object (<5 pounds) and then stand back up without loss of balance in order to return to  PLOF and meet demands at work.    Baseline  (10/26/2017) not assessed 2/2 BLT precautions; 11/28/2017: able to pick up a cone w/o UE support, no loss of balance    Time  12    Period  Weeks    Status  Achieved      PT LONG TERM GOAL #4   Title  Patient will reduce timed up and go to <11 seconds to reduce fall risk and demonstrate improved transfer/gait ability.    Baseline  10/26/2017: not assessed 2/2 to fatigue/endurance; 11/02/17: 43 seconds; 11/28/2017: 25.1 seconds (RW); 12/29/17: 20.0s    Time  12    Period  Weeks    Status  Partially Met    Target Date  04/10/18      PT LONG TERM GOAL #5   Title  Pt will increase 10MWT by at least 0.13 m/s in order to demonstrate clinically significant improvement in community ambulation.     Baseline  12/29/17: self-selected: 18s = 0.56 m/s, fastest: 13.0s = 0.77 m/s    Time  12    Period  Weeks    Status  New    Target Date  04/10/18            Plan - 01/16/18 1412    Clinical Impression Statement  Ptcontinues todemonstrate excellent motivation during sessionand is making excellent progress. Focused on supine exercises today due to reported increased stiffness in knees today. She continues to demonstrate considerable LE weakness although it is improving. She is making great progress with respect to her outcome measures when they were last tested on 12/29/17. Her BERG has improved from 23/56 initially to 42/56 and her Five Time Sit to Stand improved from 29 seconds initially to 18.6 seconds. Her TUG decreased from 43 seconds to 20 seconds. Her strength on the leg press has also  improved significantly. She continues to require use of walker for ambulation but demonstrates good stability/safety with practice today using lofstrands crutches. Pt encouraged to continue HEPandfollow-up as scheduled.Pt presents withdeficits in strength, activity tolerance, motor control, and balance and will continue to benefit from skilled therapeutic intervention.    Rehab Potential  Good    PT Frequency  2x / week    PT Duration  12 weeks    PT Treatment/Interventions  Cryotherapy;Moist Heat;Ultrasound;Gait training;Stair training;Functional mobility training;Therapeutic activities;Therapeutic exercise;Balance training;Neuromuscular re-education;Patient/family education;Orthotic Fit/Training;Manual techniques;Scar mobilization;Dry needling;Energy conservation;Electrical Stimulation    PT Next Visit Plan  BLE strengthening, balance    PT Home Exercise Plan  glute sets, LAQ, sit-to-stand with supervision and RW, standing hip aBduction (HEP provided at hospital d/c)    Consulted and Agree with Plan of Care  Patient;Family member/caregiver    Family Member Consulted  Spouse       Patient will benefit from skilled therapeutic intervention in order to improve the following deficits and impairments:  Abnormal gait, Decreased endurance, Impaired sensation, Decreased activity tolerance, Decreased strength, Pain, Decreased balance, Decreased mobility, Difficulty walking, Improper body mechanics, Decreased range of motion, Decreased coordination, Postural dysfunction  Visit Diagnosis: Difficulty in walking, not elsewhere classified  Muscle weakness (generalized)     Problem List Patient Active Problem List   Diagnosis Date Noted  . Allergic state 03/14/2017  . Arthritis 03/14/2017  . Hypertension 03/14/2017  . Osteoporosis, post-menopausal 03/14/2017  . Benign paroxysmal positional vertigo due to bilateral vestibular disorder 06/30/2015   Lyndel Safe Huprich PT, DPT, GCS   Huprich,Jason 01/16/2018, 2:19 PM  [place]  MAIN [facility] SERVICES [place], [place], [address] Phone: [phone]   Fax:  [phone]  Name: Jaclyn Hall MRN: [id number] Date of Birth: [birth year]

## 2018-01-24 ENCOUNTER — Ambulatory Visit: Payer: BLUE CROSS/BLUE SHIELD | Attending: Nurse Practitioner

## 2018-01-24 DIAGNOSIS — R262 Difficulty in walking, not elsewhere classified: Secondary | ICD-10-CM

## 2018-01-24 DIAGNOSIS — R42 Dizziness and giddiness: Secondary | ICD-10-CM | POA: Insufficient documentation

## 2018-01-24 DIAGNOSIS — M6281 Muscle weakness (generalized): Secondary | ICD-10-CM | POA: Insufficient documentation

## 2018-01-24 NOTE — Therapy (Signed)
[place] MAIN [facility] SERVICES [address] [place], [place], [address] Phone: [phone]   Fax:  [phone]  Physical Therapy Treatment  Patient Details  Name: Jaclyn Hall MRN: [id number] Date of Birth: 1945-12-09 Referring Provider (PT): Merrilee Seashore, NP   Encounter Date: 01/24/2018  PT End of Session - 01/24/18 1306    Visit Number  24    Number of Visits  49    Date for PT Re-Evaluation  04/10/18    Authorization Type  5/10 progress note, last goals: 12/29/17    PT Start Time  1301    PT Stop Time  1345    PT Time Calculation (min)  44 min    Equipment Utilized During Treatment  Gait belt    Activity Tolerance  Patient tolerated treatment well    Behavior During Therapy  [facility] for tasks assessed/performed       Past Medical History:  Diagnosis Date  . Arthritis   . Asthma   . Colon polyps   . Hypertension   . Osteoporosis   . Vertigo     Past Surgical History:  Procedure Laterality Date  . ABDOMINAL HYSTERECTOMY    . CHOLECYSTECTOMY    . COLONOSCOPY    . COLONOSCOPY N/A 11/22/2014   Procedure: COLONOSCOPY;  Surgeon: Lollie Sails, MD;  Location: [facility] ENDOSCOPY;  Service: Endoscopy;  Laterality: N/A;  . OOPHORECTOMY      There were no vitals filed for this visit.  Subjective Assessment - 01/24/18 1306    Subjective  Pt reports that she is fasting with her church in the mornings and hasn't had anything to eat yet today. she complains of bilateral knee stiffness in standing but no pain at rest.     Patient is accompained by:  Family member   [name]    Pertinent History  Patient is 73 year old female with diagnosis of spinal cord compression at T4/5 s/p laminectomy with decreased mobility and function. PMH significant for hypertension, osteoporosis, arthritis, and BPPV.     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  No/denies          TREATMENT   Therapeutic Exercise: NuStep L4 x 4 minutes during history (2 minutes unbilled); Quantum double leg press 170# 2 x 15with min VCs to slow down LE movement especially eccentric return for better motor control and strengthening,patient able to exhibit better control with increased repetition; Quantum double leg heel raise 120# 2 x 15; Standing mini squats in // bars x 10, cues for proper form; Toe taps to 4" step without UE support alternating LE  Sit to stand without UE support from mat table x 10, added 4# medicine ball x 10;   Gait Training Gait trainingwith lofstrand curtches 600' around the lap past the cafeteria. Pt again requires some cues initially for sequencing as well as intermittently throughout distance to correct. She does get fatigued during ambulation and requires seated rest break halfway through distance. Fatigue monitored with infrequent buckling noted.   Pt educated throughout session about proper posture and technique with exercises. Improved exercise technique, movement at target joints, use of target muscles after min to mod verbal, visual, tactile cues.   Ptcontinues todemonstrate excellent motivation during sessionand is makingexcellent progress.  She is able to increase resistance on her leg press today and is also able to ambulate significantly farther with the lofstrand crutches. She is notably fatigued by the end of the session today.Pt encouraged to continue HEPandfollow-up as scheduled.Pt presents withdeficits in strength, activity tolerance, motor control, and balance and will continue to benefit from skilled therapeutic intervention.                          PT Short Term Goals - 01/16/18 1413      PT SHORT TERM GOAL #1   Title  Patient will be adherent to HEP at  least 3x a week to improve functional strength in BLE and balance in order to decrease risk of falls and maintain safety at home.    Baseline  Walking supported, sit <>stands performed most days per patient reports    Time  3    Period  Weeks    Status  On-going    Target Date  02/27/18      PT SHORT TERM GOAL #2   Title  Patient will complete five times sit to stand test in < 24 seconds (2 MCIDs) indicating increased LE strength and improved balance in order to improve mobility and decrease risk of falls.    Baseline  (10/26/2017) 29 sec; (11/28/2017) 26.8 sec; 12/29/17: 18.6 sec;    Time  3    Period  Weeks    Status  Achieved      PT SHORT TERM GOAL #3   Title  Patient will ambulate with RW, SBA, on level surfaces for >150 feet with no LOB and no sitting breaks in order to demonstrate increased independence and improved mobility.    Baseline  (10/26/2017) <25 feet, (11/28/2017), 375 ft    Time  3    Period  Weeks    Status  Achieved        PT Long Term Goals - 01/16/18 1413      PT LONG TERM GOAL #1   Title  Patient will increase Berg Balance score to 46/56 to demonstrate decreased fall risk during functional activities and maintain safety.    Baseline  (10/26/2017) 23/56; 11/28/2017: 39/56; 12/29/17: 42/56    Time  8    Period  Weeks    Status  On-going    Target Date  04/10/18      PT LONG TERM GOAL #2   Title  Patient will complete five times sit to stand test in < 15 seconds (2 MCIDs) indicating increased LE strength and improved balance in order to improve mobility and decrease risk of falls.    Baseline  (10/26/2017) 29 sec; (11/28/2017) 26.8 sec; 12/29/17: 18.6s    Time  12    Period  Weeks    Status  On-going    Target Date  04/10/18      PT LONG TERM GOAL #3   Title  Patient will be independent in bending down towards floor and picking up small object (<5 pounds) and then stand back up without loss of balance in order to return to PLOF and meet demands at work.     Baseline  (10/26/2017) not assessed 2/2 BLT precautions; 11/28/2017: able to pick up a cone w/o UE support, no loss of balance    Time  12    Period  Weeks    Status  Achieved      PT LONG TERM GOAL #4   Title  Patient will reduce timed up and go to <11 seconds to reduce fall risk and demonstrate improved transfer/gait ability.    Baseline  10/26/2017: not assessed 2/2 to fatigue/endurance; 11/02/17: 43 seconds; 11/28/2017: 25.1 seconds (RW); 12/29/17: 20.0s    Time  12    Period  Weeks    Status  Partially Met    Target Date  04/10/18      PT LONG TERM GOAL #5   Title  Pt will increase 10MWT by at least 0.13 m/s in order to demonstrate clinically significant improvement in community ambulation.     Baseline  12/29/17: self-selected: 18s = 0.56 m/s, fastest: 13.0s = 0.77 m/s    Time  12    Period  Weeks    Status  New    Target Date  04/10/18            Plan - 01/24/18 1307    Clinical Impression Statement  Ptcontinues todemonstrate excellent motivation during sessionand is makingexcellent progress. She is able to increase resistance on her leg press today and is also able to ambulate significantly farther with the lofstrand crutches. She is notably fatigued by the end of the session today.Pt encouraged to continue HEPandfollow-up as scheduled.Pt presents withdeficits in strength, activity tolerance, motor control, and balance and will continue to benefit from skilled therapeutic intervention.    Rehab Potential  Good    PT Frequency  2x / week    PT Duration  12 weeks    PT Treatment/Interventions  Cryotherapy;Moist Heat;Ultrasound;Gait training;Stair training;Functional mobility training;Therapeutic activities;Therapeutic exercise;Balance training;Neuromuscular re-education;Patient/family education;Orthotic Fit/Training;Manual techniques;Scar mobilization;Dry needling;Energy conservation;Electrical Stimulation    PT Next Visit Plan  BLE strengthening, balance    PT Home  Exercise Plan  glute sets, LAQ, sit-to-stand with supervision and RW, standing hip aBduction (HEP provided at hospital d/c)    Consulted and Agree with Plan of Care  Patient;Family member/caregiver    Family Member Consulted  Spouse       Patient will benefit from skilled therapeutic intervention in order to improve the following deficits and impairments:  Abnormal gait, Decreased endurance, Impaired sensation, Decreased activity tolerance, Decreased strength, Pain, Decreased balance, Decreased mobility, Difficulty walking, Improper body mechanics, Decreased range of motion, Decreased coordination, Postural dysfunction  Visit Diagnosis: Difficulty in walking, not elsewhere classified  Muscle weakness (generalized)     Problem List Patient Active Problem List   Diagnosis Date Noted  . Allergic state 03/14/2017  . Arthritis 03/14/2017  . Hypertension 03/14/2017  . Osteoporosis, post-menopausal 03/14/2017  . Benign paroxysmal positional vertigo due to bilateral vestibular disorder 06/30/2015   Phillips Grout PT, DPT, GCS  Tessi Eustache 01/24/2018, 2:31 PM  [place] MAIN [facility] SERVICES [address] [place], [place], [address] Phone: [phone]   Fax:  [phone]  Name: RHAYA COALE MRN: [id number] Date of Birth: 06/28/1945

## 2018-01-26 ENCOUNTER — Ambulatory Visit: Payer: BLUE CROSS/BLUE SHIELD

## 2018-01-26 DIAGNOSIS — R262 Difficulty in walking, not elsewhere classified: Secondary | ICD-10-CM

## 2018-01-26 DIAGNOSIS — M6281 Muscle weakness (generalized): Secondary | ICD-10-CM

## 2018-01-26 NOTE — Therapy (Signed)
[place] MAIN [facility] SERVICES [address] [place], [place], [address] Phone: [phone]   Fax:  [phone]  Physical Therapy Treatment  Patient Details  Name: Jaclyn Hall MRN: [id number] Date of Birth: 06/14/1945 Referring Provider (PT): Merrilee Seashore, NP   Encounter Date: 01/26/2018  PT End of Session - 01/26/18 1306    Visit Number  25    Number of Visits  49    Date for PT Re-Evaluation  04/10/18    Authorization Type  5/10 progress note, last goals: 12/29/17    PT Start Time  1302    PT Stop Time  1345    PT Time Calculation (min)  43 min    Equipment Utilized During Treatment  Gait belt    Activity Tolerance  Patient tolerated treatment well    Behavior During Therapy  [facility] for tasks assessed/performed       Past Medical History:  Diagnosis Date  . Arthritis   . Asthma   . Colon polyps   . Hypertension   . Osteoporosis   . Vertigo     Past Surgical History:  Procedure Laterality Date  . ABDOMINAL HYSTERECTOMY    . CHOLECYSTECTOMY    . COLONOSCOPY    . COLONOSCOPY N/A 11/22/2014   Procedure: COLONOSCOPY;  Surgeon: Lollie Sails, MD;  Location: [facility] ENDOSCOPY;  Service: Endoscopy;  Laterality: N/A;  . OOPHORECTOMY      There were no vitals filed for this visit.  Subjective Assessment - 01/26/18 1305    Subjective  Pt reports that she is fasting with her church in the mornings and hasn't had anything to eat yet again today. She continues to complain of bilateral knee stiffness in standing but no pain at rest. No specific questions at this time. She has an appointment with her MD tomorrow.     Patient is accompained by:  Family member   [name]    Pertinent History  Patient is 73 year old female with diagnosis of spinal cord compression at T4/5 s/p laminectomy with decreased mobility and function. PMH significant for hypertension, osteoporosis, arthritis, and BPPV.     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  No/denies          TREATMENT   Therapeutic Exercise: Standing marches with BUE support x 10 bilateral; Standing heel raises x 10 with BUE support;; Standing mini squats x 10 with BUE support; Seated clams with manual resistance x 15; Seated adductor squeeze with manual resistance x 15; Sit to stand without UE support from regular chair x 10; Standing mini squats in // bars x 10, cues for proper form; Quantum single leg press 80#2 x10with min VCs to slow down LE movement especially eccentric return for better motor control and strengthening,very minor assist provided for LLE; Quantum double legheel raise 120# 2 x 15;   Gait Training Forward, backwards, and sidestepping in // bars without UE support, cues for step length and speed; Gait trainingwith lofstrand curtches 400' in hallway. Pt again requires some cues initially for sequencing as well as intermittently throughout distance to correct. She does get fatigued during ambulation and requires a seated rest break during ambulation.   Pt educated throughout session about proper posture and technique with exercises.  Improved exercise technique, movement at target joints, use of target muscles after min to mod verbal, visual, tactile cues.   Ptcontinues todemonstrate excellent motivation during sessionand is makingexcellent progress. She is able to increase resistance on her leg press today again. Continued improvement with the lofstrand crutches. She has a follow-up appointment with her surgeon tomorrow. She is making great progress with respect to her ambulation as well as leg strength and balance. However she remains considerably impaired. 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 She will need updated outcome measures/goals at next visit.Pt encouraged to continue HEPandfollow-up as scheduled.Pt presents withdeficits in strength, activity tolerance, motor control, and balance and will continue to benefit from skilled therapeutic intervention.                       PT Short Term Goals - 01/16/18 1413      PT SHORT TERM GOAL #1   Title  Patient will be adherent to HEP at least 3x a week to improve functional strength in BLE and balance in order to decrease risk of falls and maintain safety at home.    Baseline  Walking supported, sit <>stands performed most days per patient reports    Time  3    Period  Weeks    Status  On-going    Target Date  02/27/18      PT SHORT TERM GOAL #2   Title  Patient will complete five times sit to stand test in < 24 seconds (2 MCIDs) indicating increased LE strength and improved balance in order to improve mobility and decrease risk of falls.    Baseline  (10/26/2017) 29 sec; (11/28/2017) 26.8 sec; 12/29/17: 18.6 sec;    Time  3    Period  Weeks    Status  Achieved      PT SHORT TERM GOAL #3   Title  Patient will ambulate with RW, SBA, on level surfaces for >150 feet with no LOB and no sitting breaks in order to demonstrate increased independence and improved mobility.    Baseline  (10/26/2017) <25 feet, (11/28/2017), 375 ft    Time  3    Period  Weeks    Status  Achieved        PT Long Term Goals - 01/16/18 1413      PT LONG TERM GOAL #1   Title  Patient will increase Berg Balance score to 46/56 to demonstrate decreased fall risk during functional activities and maintain safety.    Baseline  (10/26/2017) 23/56; 11/28/2017: 39/56; 12/29/17: 42/56    Time  8    Period  Weeks    Status  On-going    Target Date  04/10/18      PT LONG TERM GOAL #2   Title  Patient will complete five times sit to stand test in < 15 seconds (2 MCIDs)  indicating increased LE strength and improved balance in order to improve mobility and decrease risk of falls.    Baseline  (10/26/2017) 29 sec; (11/28/2017) 26.8 sec; 12/29/17: 18.6s    Time  12    Period  Weeks    Status  On-going    Target Date  04/10/18      PT LONG TERM GOAL #3   Title  Patient will be independent in bending down towards floor and picking up small object (<5 pounds) and then stand back up without loss of  balance in order to return to PLOF and meet demands at work.    Baseline  (10/26/2017) not assessed 2/2 BLT precautions; 11/28/2017: able to pick up a cone w/o UE support, no loss of balance    Time  12    Period  Weeks    Status  Achieved      PT LONG TERM GOAL #4   Title  Patient will reduce timed up and go to <11 seconds to reduce fall risk and demonstrate improved transfer/gait ability.    Baseline  10/26/2017: not assessed 2/2 to fatigue/endurance; 11/02/17: 43 seconds; 11/28/2017: 25.1 seconds (RW); 12/29/17: 20.0s    Time  12    Period  Weeks    Status  Partially Met    Target Date  04/10/18      PT LONG TERM GOAL #5   Title  Pt will increase 10MWT by at least 0.13 m/s in order to demonstrate clinically significant improvement in community ambulation.     Baseline  12/29/17: self-selected: 18s = 0.56 m/s, fastest: 13.0s = 0.77 m/s    Time  12    Period  Weeks    Status  New    Target Date  04/10/18            Plan - 01/26/18 1306    Clinical Impression Statement  Ptcontinues todemonstrate excellent motivation during sessionand is makingexcellent progress. She is able to increase resistance on her leg press today again. Continued improvement with the lofstrand crutches. She has a follow-up appointment with her surgeon tomorrow. She is making great progress with respect to her ambulation as well as leg strength and balance. However she remains considerably impaired. She reports persistent knee stiffness and pain with ambulation and continues to  demonstrate some bilateral genu recurvatum. She will need updated outcome measures/goals at next visit.Pt encouraged to continue HEPandfollow-up as scheduled.Pt presents withdeficits in strength, activity tolerance, motor control, and balance and will continue to benefit from skilled therapeutic intervention.    Rehab Potential  Good    PT Frequency  2x / week    PT Duration  12 weeks    PT Treatment/Interventions  Cryotherapy;Moist Heat;Ultrasound;Gait training;Stair training;Functional mobility training;Therapeutic activities;Therapeutic exercise;Balance training;Neuromuscular re-education;Patient/family education;Orthotic Fit/Training;Manual techniques;Scar mobilization;Dry needling;Energy conservation;Electrical Stimulation    PT Next Visit Plan  Outcome measures/goals, BLE strengthening, balance    PT Home Exercise Plan  glute sets, LAQ, sit-to-stand with supervision and RW, standing hip aBduction (HEP provided at hospital d/c)    Consulted and Agree with Plan of Care  Patient;Family member/caregiver    Family Member Consulted  Spouse       Patient will benefit from skilled therapeutic intervention in order to improve the following deficits and impairments:  Abnormal gait, Decreased endurance, Impaired sensation, Decreased activity tolerance, Decreased strength, Pain, Decreased balance, Decreased mobility, Difficulty walking, Improper body mechanics, Decreased range of motion, Decreased coordination, Postural dysfunction  Visit Diagnosis: Difficulty in walking, not elsewhere classified  Muscle weakness (generalized)     Problem List Patient Active Problem List   Diagnosis Date Noted  . Allergic state 03/14/2017  . Arthritis 03/14/2017  . Hypertension 03/14/2017  . Osteoporosis, post-menopausal 03/14/2017  . Benign paroxysmal positional vertigo due to bilateral vestibular disorder 06/30/2015   Phillips Grout PT, DPT, GCS  Giovannina Mun 01/27/2018, 1:35 PM  [place] MAIN [facility] SERVICES [address] [place], [place], [address] Phone: [phone]   Fax:  [phone]  Name: Jaclyn Hall MRN:  [id number] Date of Birth: 22-May-1945

## 2018-02-01 ENCOUNTER — Ambulatory Visit: Payer: BLUE CROSS/BLUE SHIELD

## 2018-02-01 VITALS — BP 165/68 | HR 70

## 2018-02-01 DIAGNOSIS — R262 Difficulty in walking, not elsewhere classified: Secondary | ICD-10-CM

## 2018-02-01 DIAGNOSIS — R42 Dizziness and giddiness: Secondary | ICD-10-CM

## 2018-02-01 DIAGNOSIS — M6281 Muscle weakness (generalized): Secondary | ICD-10-CM

## 2018-02-01 NOTE — Therapy (Signed)
[place] MAIN [facility] SERVICES [address] [place], [place], [address] Phone: [phone]   Fax:  [phone]  Physical Therapy Treatment/Recertification  Patient Details  Name: Jaclyn Hall MRN: [id number] Date of Birth: 13-Mar-1945 Referring Provider (PT): Merrilee Seashore, NP   Encounter Date: 02/01/2018  PT End of Session - 02/01/18 1536    Visit Number  26    Number of Visits  49    Date for PT Re-Evaluation  04/10/18    Authorization Type  last goals: 12/29/17    PT Start Time  1530    PT Stop Time  1600    PT Time Calculation (min)  30 min    Equipment Utilized During Treatment  Gait belt    Activity Tolerance  Patient tolerated treatment well    Behavior During Therapy  [facility] for tasks assessed/performed       Past Medical History:  Diagnosis Date  . Arthritis   . Asthma   . Colon polyps   . Hypertension   . Osteoporosis   . Vertigo     Past Surgical History:  Procedure Laterality Date  . ABDOMINAL HYSTERECTOMY    . CHOLECYSTECTOMY    . COLONOSCOPY    . COLONOSCOPY N/A 11/22/2014   Procedure: COLONOSCOPY;  Surgeon: Lollie Sails, MD;  Location: [facility] ENDOSCOPY;  Service: Endoscopy;  Laterality: N/A;  . OOPHORECTOMY      Vitals:   02/01/18 1532  BP: (!) 165/68  Pulse: 70  SpO2: 100%    Subjective Assessment - 02/01/18 1532    Subjective  Pt reports that she is doing well today. No knee pain at rest however she reports continued bilateral knee pain in standing/walking. She has been dealing with submitting disability paperwork to her HR department which has been somewhat upsetting. She is in the process of collecting all her medical records.     Patient is accompained by:  Family member   [name]    Pertinent History  Patient is 73 year old female with diagnosis of spinal cord compression at T4/5 s/p laminectomy with decreased mobility and function. PMH significant for hypertension, osteoporosis, arthritis, and BPPV.      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  No/denies           TREATMENT   Therapeutic Exercise: NuStep L2-L6 warm-up with therapist increasing/decreasing resistance based on pt response/fatigue x 5 minutes during history (3 minutes unbilled); Hooklying SLR x 10 bilateral; Hooklying bridges with 3s hold x 10; Hooklying clams with manual resistance x 10; Hooklying adductor squeeze with manual resistance x 10; Sidelying SLR hip abduction x 10 bilateral; Pt reports history of vertigo for the last few years and she was having some symptoms recently while rolling in bed. Dix-Hallpike and roll tests performed with patient. During Dix-Hallpike testing pt presents with pure horizontal geotrophic nystagmus bilaterally with concurrent vertigo. Roll test is also positive with pure horizontal geotrophic nystagmus in both directions. Symptoms and nystagmus are worse when rolling to the right. Infrared goggles utilized to record nystagmus. Will request approval from referring MD for canalith repositioning treatment.    Pt educated throughout session about proper posture and technique with exercises. Improved exercise technique, movement at target joints, use of target muscles after  min to mod verbal, visual, tactile cues.   Ptarrived late for her appointment so session abbreviated accordingly. She continues todemonstrate excellent motivation during sessionand is makingexcellent progress with LE strength. She remains weak however and is easily fatigued. She reports history of vertigo for the last few years and she was having some symptoms recently while rolling in bed. Dix-Hallpike and roll tests performed with patient. During Dix-Hallpike testing pt presents with pure horizontal  geotrophic nystagmus bilaterally with concurrent vertigo. Roll test is also positive with pure horizontal geotrophic nystagmus in both directions. Symptoms and nystagmus are worse when rolling to the right. Infrared goggles utilized to record nystagmus. 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 She will need updated outcome measures/goals at next visit.Pt encouraged to continue HEPandfollow-up as scheduled.Pt presents withdeficits in strength, activity tolerance, motor control, and balance and will continue to benefit from skilled therapeutic intervention.                      PT Short Term Goals - 01/16/18 1413      PT SHORT TERM GOAL #1   Title  Patient will be adherent to HEP at least 3x a week to improve functional strength in BLE and balance in order to decrease risk of falls and maintain safety at home.    Baseline  Walking supported, sit <>stands performed most days per patient reports    Time  3    Period  Weeks    Status  On-going    Target Date  02/27/18      PT SHORT TERM GOAL #2   Title  Patient will complete five times sit to stand test in < 24 seconds (2 MCIDs) indicating increased LE strength and improved balance in order to improve mobility and decrease risk of falls.    Baseline  (10/26/2017) 29 sec; (11/28/2017) 26.8 sec; 12/29/17: 18.6 sec;    Time  3    Period  Weeks    Status  Achieved      PT SHORT TERM GOAL #3   Title  Patient will ambulate with RW, SBA, on level surfaces for >150 feet with no LOB and no sitting breaks in order to demonstrate increased independence and improved mobility.    Baseline  (10/26/2017) <25 feet, (11/28/2017), 375 ft    Time  3    Period  Weeks    Status  Achieved        PT Long Term Goals - 01/16/18 1413      PT LONG TERM GOAL #1   Title  Patient will increase Berg Balance score to 46/56 to demonstrate decreased fall risk during  functional activities and maintain safety.    Baseline  (10/26/2017) 23/56; 11/28/2017: 39/56; 12/29/17: 42/56    Time  8    Period  Weeks    Status  On-going    Target Date  04/10/18      PT LONG TERM GOAL #2   Title  Patient will complete five times sit to stand test in < 15 seconds (2 MCIDs) indicating increased LE strength and improved balance in order to improve mobility and decrease risk of falls.    Baseline  (10/26/2017) 29 sec; (11/28/2017) 26.8 sec; 12/29/17: 18.6s    Time  12    Period  Weeks    Status  On-going    Target Date  04/10/18      PT LONG TERM GOAL #  3   Title  Patient will be independent in bending down towards floor and picking up small object (<5 pounds) and then stand back up without loss of balance in order to return to PLOF and meet demands at work.    Baseline  (10/26/2017) not assessed 2/2 BLT precautions; 11/28/2017: able to pick up a cone w/o UE support, no loss of balance    Time  12    Period  Weeks    Status  Achieved      PT LONG TERM GOAL #4   Title  Patient will reduce timed up and go to <11 seconds to reduce fall risk and demonstrate improved transfer/gait ability.    Baseline  10/26/2017: not assessed 2/2 to fatigue/endurance; 11/02/17: 43 seconds; 11/28/2017: 25.1 seconds (RW); 12/29/17: 20.0s    Time  12    Period  Weeks    Status  Partially Met    Target Date  04/10/18      PT LONG TERM GOAL #5   Title  Pt will increase 10MWT by at least 0.13 m/s in order to demonstrate clinically significant improvement in community ambulation.     Baseline  12/29/17: self-selected: 18s = 0.56 m/s, fastest: 13.0s = 0.77 m/s    Time  12    Period  Weeks    Status  New    Target Date  04/10/18            Plan - 02/01/18 1537    Clinical Impression Statement  Ptarrived late for her appointment so session abbreviated accordingly. She continues todemonstrate excellent motivation during sessionand is makingexcellent progress with LE strength. She  remains weak however and is easily fatigued. She reports history of vertigo for the last few years and she was having some symptoms recently while rolling in bed. Dix-Hallpike and roll tests performed with patient. During Dix-Hallpike testing pt presents with pure horizontal geotrophic nystagmus bilaterally with concurrent vertigo. Roll test is also positive with pure horizontal geotrophic nystagmus in both directions. Symptoms and nystagmus are worse when rolling to the right. Infrared goggles utilized to record nystagmus. 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 She will need updated outcome measures/goals at next visit.Pt encouraged to continue HEPandfollow-up as scheduled.Pt presents withdeficits in strength, activity tolerance, motor control, and balance and will continue to benefit from skilled therapeutic intervention.    Rehab Potential  Good    PT Frequency  2x / week    PT Duration  12 weeks    PT Treatment/Interventions  Cryotherapy;Moist Heat;Ultrasound;Gait training;Stair training;Functional mobility training;Therapeutic activities;Therapeutic exercise;Balance training;Neuromuscular re-education;Patient/family education;Orthotic Fit/Training;Manual techniques;Scar mobilization;Dry needling;Energy conservation;Electrical Stimulation;Canalith Repostioning;Vestibular    PT Next Visit Plan  Outcome measures/goals, [place] roll for possible R horizontal canal BPPV, BLE strengthening, balance    PT Home Exercise Plan  glute sets, LAQ, sit-to-stand with supervision and RW, standing hip aBduction (HEP provided at hospital d/c)    Consulted and Agree with Plan of Care  Patient;Family member/caregiver    Family Member Consulted  Spouse       Patient will benefit from skilled therapeutic intervention in order to improve the following deficits and impairments:  Abnormal gait, Decreased endurance, Impaired sensation, Decreased  activity tolerance, Decreased strength, Pain, Decreased balance, Decreased mobility, Difficulty walking, Improper body mechanics, Decreased range of motion, Decreased coordination, Postural dysfunction  Visit Diagnosis: Difficulty in walking, not elsewhere classified - Plan: PT plan of care cert/re-cert  Muscle weakness (generalized) -  Plan: PT plan of care cert/re-cert  Dizziness and giddiness - Plan: PT plan of care cert/re-cert     Problem List Patient Active Problem List   Diagnosis Date Noted  . Allergic state 03/14/2017  . Arthritis 03/14/2017  . Hypertension 03/14/2017  . Osteoporosis, post-menopausal 03/14/2017  . Benign paroxysmal positional vertigo due to bilateral vestibular disorder 06/30/2015   Phillips Grout PT, DPT, GCS  Maurie Musco 02/02/2018, 4:12 PM  [place] MAIN [facility] SERVICES [address] [place], [place], [address] Phone: [phone]   Fax:  [phone]  Name: Jaclyn Hall MRN: [id number] Date of Birth: 10-17-1945

## 2018-02-07 ENCOUNTER — Ambulatory Visit: Payer: BLUE CROSS/BLUE SHIELD

## 2018-02-07 DIAGNOSIS — R262 Difficulty in walking, not elsewhere classified: Secondary | ICD-10-CM

## 2018-02-07 DIAGNOSIS — M6281 Muscle weakness (generalized): Secondary | ICD-10-CM

## 2018-02-07 NOTE — Therapy (Signed)
[place] MAIN [facility] SERVICES [address] [place], [place], [address] Phone: [phone]   Fax:  [phone]  Physical Therapy Treatment  Patient Details  Name: Jaclyn Hall MRN: [id number] Date of Birth: [birth year] Referring Provider (PT): Merrilee Seashore, NP   Encounter Date: 02/07/2018  PT End of Session - 02/07/18 1345    Visit Number  27    Number of Visits  49    Authorization Type  last goals: 12/29/17    PT Start Time  1346    PT Stop Time  1431    PT Time Calculation (min)  45 min    Equipment Utilized During Treatment  Gait belt    Activity Tolerance  Patient tolerated treatment well    Behavior During Therapy  [facility] for tasks assessed/performed       Past Medical History:  Diagnosis Date  . Arthritis   . Asthma   . Colon polyps   . Hypertension   . Osteoporosis   . Vertigo     Past Surgical History:  Procedure Laterality Date  . ABDOMINAL HYSTERECTOMY    . CHOLECYSTECTOMY    . COLONOSCOPY    . COLONOSCOPY N/A 11/22/2014   Procedure: COLONOSCOPY;  Surgeon: Lollie Sails, MD;  Location: [facility] ENDOSCOPY;  Service: Endoscopy;  Laterality: N/A;  . OOPHORECTOMY      There were no vitals filed for this visit.  Subjective Assessment - 02/07/18 1350    Subjective  Patient reports that she does have knee pain today 6-7/10 L > R. stated over the weekend it was even worse. Reported that she did ice and has taken ibuprofen.    Pertinent History  Patient is 73 year old female with diagnosis of spinal cord compression at T4/5 s/p laminectomy with decreased mobility and function. PMH significant for hypertension, osteoporosis, arthritis, and BPPV.     Limitations  Lifting;House hold activities;Walking;Standing    How long can you sit comfortably?  30 min    How long can you stand comfortably?  <5 min    How long can you walk comfortably?  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  Yes    Pain Score  7     Pain Location  Knee    Pain Orientation  Left;Right    Pain Descriptors / Indicators  Aching;Burning    Pain Onset  1 to 4 weeks ago       TREATMENT   Therapeutic Exercise: NuStep L2-L6 warm-up with therapist increasing/decreasing resistance based on pt response/fatigue x 5 minutes during history (3 minutes unbilled); Standing marches with BUE support x 10 bilateral; Standing heel raises x 10 with BUE support;; Standing toe raises x10 BUE support Standing mini squats x 10 with BUE support; Seated clams with GTB x20  Seated adductor squeeze with ball squeeze x20 Standing mini squats in // bars x 10 with UE support cues for proper form; Quantum single leg press 80# x 10 with min VCs to slow down LE movement especially eccentric return for better motor control and strengthening, very minor assist provided for LLE; Quantum double leg heel raise 120# x10;   Gait Training sidestepping in // bars without UE support, cues for step length and speed; Gait training with lofstrand crutches 500' in hallway.  Pt again requires some cues initially for sequencing as well as intermittently throughout distance to correct. She does get fatigued during ambulation, no seated rest break this session during ambulation, just after.     PT Education - 02/07/18 1351    Education Details  exercise technique/form    Person(s) Educated  Patient    Methods  Verbal cues    Comprehension  Verbalized understanding;Returned demonstration       PT Short Term Goals - 01/16/18 1413      PT SHORT TERM GOAL #1   Title  Patient will be adherent to HEP at least 3x a week to improve functional strength in BLE and balance in order to decrease risk of falls and maintain safety at home.    Baseline  Walking supported, sit <>stands performed most days per patient reports    Time  3     Period  Weeks    Status  On-going    Target Date  02/27/18      PT SHORT TERM GOAL #2   Title  Patient will complete five times sit to stand test in < 24 seconds (2 MCIDs) indicating increased LE strength and improved balance in order to improve mobility and decrease risk of falls.    Baseline  (10/26/2017) 29 sec; (11/28/2017) 26.8 sec; 12/29/17: 18.6 sec;    Time  3    Period  Weeks    Status  Achieved      PT SHORT TERM GOAL #3   Title  Patient will ambulate with RW, SBA, on level surfaces for >150 feet with no LOB and no sitting breaks in order to demonstrate increased independence and improved mobility.    Baseline  (10/26/2017) <25 feet, (11/28/2017), 375 ft    Time  3    Period  Weeks    Status  Achieved        PT Long Term Goals - 01/16/18 1413      PT LONG TERM GOAL #1   Title  Patient will increase Berg Balance score to 46/56 to demonstrate decreased fall risk during functional activities and maintain safety.    Baseline  (10/26/2017) 23/56; 11/28/2017: 39/56; 12/29/17: 42/56    Time  8    Period  Weeks    Status  On-going    Target Date  04/10/18      PT LONG TERM GOAL #2   Title  Patient will complete five times sit to stand test in < 15 seconds (2 MCIDs) indicating increased LE strength and improved balance in order to improve mobility and decrease risk of falls.    Baseline  (10/26/2017) 29 sec; (11/28/2017) 26.8 sec; 12/29/17: 18.6s    Time  12    Period  Weeks    Status  On-going    Target Date  04/10/18      PT LONG TERM GOAL #3   Title  Patient will be independent in bending down towards floor and picking up small object (<5 pounds) and then stand back up without loss of balance in order to return to PLOF and meet demands at work.    Baseline  (10/26/2017) not assessed 2/2 BLT precautions; 11/28/2017: able to pick up a cone w/o UE support, no loss of balance    Time  12    Period  Weeks    Status  Achieved      PT LONG TERM GOAL #4   Title  Patient will  reduce timed up and  go to <11 seconds to reduce fall risk and demonstrate improved transfer/gait ability.    Baseline  10/26/2017: not assessed 2/2 to fatigue/endurance; 11/02/17: 43 seconds; 11/28/2017: 25.1 seconds (RW); 12/29/17: 20.0s    Time  12    Period  Weeks    Status  Partially Met    Target Date  04/10/18      PT LONG TERM GOAL #5   Title  Pt will increase 10MWT by at least 0.13 m/s in order to demonstrate clinically significant improvement in community ambulation.     Baseline  12/29/17: self-selected: 18s = 0.56 m/s, fastest: 13.0s = 0.77 m/s    Time  12    Period  Weeks    Status  New    Target Date  04/10/18            Plan - 02/07/18 1633    Clinical Impression Statement  Patient initially reported increased knee pain, L >R. At end of session pt stated that she did not have any knee pain, that her pain had greatly improved. Pt demonstrated 1-2 instances of L knee and R knee buckling, minAx1 from PT and UE support needed to correct. Pt demonstrated and complained of LE fatigue by end of session.  With verbal cues, pt able to perform therapeutic exercises with proper form and technique. The patient would benefit from further skilled PT to continue addressing goals, maximize mobility, gait, and functional activities.     Rehab Potential  Good    PT Frequency  2x / week    PT Duration  12 weeks    PT Treatment/Interventions  Cryotherapy;Moist Heat;Ultrasound;Gait training;Stair training;Functional mobility training;Therapeutic activities;Therapeutic exercise;Balance training;Neuromuscular re-education;Patient/family education;Orthotic Fit/Training;Manual techniques;Scar mobilization;Dry needling;Energy conservation;Electrical Stimulation;Canalith Repostioning;Vestibular    PT Next Visit Plan  Outcome measures/goals, [place] roll for possible R horizontal canal BPPV, BLE strengthening, balance    PT Home Exercise Plan  glute sets, LAQ, sit-to-stand with supervision and RW,  standing hip aBduction (HEP provided at hospital d/c)    Consulted and Agree with Plan of Care  Patient       Patient will benefit from skilled therapeutic intervention in order to improve the following deficits and impairments:  Abnormal gait, Decreased endurance, Impaired sensation, Decreased activity tolerance, Decreased strength, Pain, Decreased balance, Decreased mobility, Difficulty walking, Improper body mechanics, Decreased range of motion, Decreased coordination, Postural dysfunction  Visit Diagnosis: Difficulty in walking, not elsewhere classified  Muscle weakness (generalized)     Problem List Patient Active Problem List   Diagnosis Date Noted  . Allergic state 03/14/2017  . Arthritis 03/14/2017  . Hypertension 03/14/2017  . Osteoporosis, post-menopausal 03/14/2017  . Benign paroxysmal positional vertigo due to bilateral vestibular disorder 06/30/2015    Lieutenant Diego PT, DPT 4:46 PM,02/07/18 [place] MAIN [facility] SERVICES [address] [place], [place], [address] Phone: [phone]   Fax:  [phone]  Name: ARALI SOMERA MRN: [id number] Date of Birth: 08-29-1945

## 2018-02-09 ENCOUNTER — Ambulatory Visit: Payer: BLUE CROSS/BLUE SHIELD

## 2018-02-09 DIAGNOSIS — R262 Difficulty in walking, not elsewhere classified: Secondary | ICD-10-CM

## 2018-02-09 DIAGNOSIS — M6281 Muscle weakness (generalized): Secondary | ICD-10-CM

## 2018-02-09 DIAGNOSIS — R42 Dizziness and giddiness: Secondary | ICD-10-CM

## 2018-02-09 NOTE — Therapy (Signed)
[place] MAIN [facility] SERVICES [address] [place], [place], [address] Phone: [phone]   Fax:  [phone]  Physical Therapy Treatment  Patient Details  Name: Jaclyn Hall MRN: [id number] Date of Birth: March 06, 1945 Referring Provider (PT): Merrilee Seashore, NP   Encounter Date: 02/09/2018  PT End of Session - 02/09/18 1615    Visit Number  28    Number of Visits  73    Date for PT Re-Evaluation  04/10/18    Authorization Type  last goals: 12/29/17    PT Start Time  1604    PT Stop Time  1645    PT Time Calculation (min)  41 min    Equipment Utilized During Treatment  Gait belt    Activity Tolerance  Patient tolerated treatment well    Behavior During Therapy  [facility] for tasks assessed/performed       Past Medical History:  Diagnosis Date  . Arthritis   . Asthma   . Colon polyps   . Hypertension   . Osteoporosis   . Vertigo     Past Surgical History:  Procedure Laterality Date  . ABDOMINAL HYSTERECTOMY    . CHOLECYSTECTOMY    . COLONOSCOPY    . COLONOSCOPY N/A 11/22/2014   Procedure: COLONOSCOPY;  Surgeon: Lollie Sails, MD;  Location: [facility] ENDOSCOPY;  Service: Endoscopy;  Laterality: N/A;  . OOPHORECTOMY      There were no vitals filed for this visit.  Subjective Assessment - 02/09/18 1613    Subjective  Patient reports her knee and hip pain is still present but is not as bad as earlier this week or this weekend.     Pertinent History  Patient is 73 year old female with diagnosis of spinal cord compression at T4/5 s/p laminectomy with decreased mobility and function. PMH significant for hypertension, osteoporosis, arthritis, and BPPV.     Limitations  Lifting;House hold activities;Walking;Standing    How long can you sit comfortably?  30 min    How long can you stand comfortably?  <5 min    How long can you walk comfortably?  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  Yes    Pain Score  5     Pain Location  Knee    Pain Orientation  Right;Left    Pain Descriptors / Indicators  Aching;Burning    Pain Type  Acute pain    Pain Onset  1 to 4 weeks ago    Pain Frequency  Constant          Therapeutic Exercise: NuStep L2-L6 warm-up with therapist increasing/decreasing resistance based on pt response/fatigue  Standing marches with BUE support x 10 bilateral; Standing heel raises x 10 with BUE support;; Standing toe raises x10 BUE support Standing mini squats x 10 with BUE support; Seated clams with GTB x20  Seated adductor squeeze with ball squeeze x20 Standing mini squats in // bars x 10 with UE support cues for proper form; One foot on dyna disc one foot on ground, modified single limb stance 2 x30 seconds Quantum single leg press 80# x 10 with min VCs to slow down LE movement especially eccentric return for better motor control and strengthening, very minor assist provided for LLE; Quantum double leg heel raise  120# x10;  Seated hamstring stretch 2x 30 seconds on 4" step      Patient requires tactile and verbal cueing for body mechanics and muscle recruitment patterns. CGA for standing interventions performed for stability and to decrease risk of episodes of knee buckling.                  PT Education - 02/09/18 1614    Education Details  exercise technique, stability     Person(s) Educated  Patient    Methods  Explanation;Demonstration;Tactile cues;Verbal cues    Comprehension  Verbalized understanding;Returned demonstration;Tactile cues required;Need further instruction;Verbal cues required       PT Short Term Goals - 01/16/18 1413      PT SHORT TERM GOAL #1   Title  Patient will be adherent to HEP at least 3x a week to improve functional strength in BLE and balance in order to decrease risk of falls and maintain  safety at home.    Baseline  Walking supported, sit <>stands performed most days per patient reports    Time  3    Period  Weeks    Status  On-going    Target Date  02/27/18      PT SHORT TERM GOAL #2   Title  Patient will complete five times sit to stand test in < 24 seconds (2 MCIDs) indicating increased LE strength and improved balance in order to improve mobility and decrease risk of falls.    Baseline  (10/26/2017) 29 sec; (11/28/2017) 26.8 sec; 12/29/17: 18.6 sec;    Time  3    Period  Weeks    Status  Achieved      PT SHORT TERM GOAL #3   Title  Patient will ambulate with RW, SBA, on level surfaces for >150 feet with no LOB and no sitting breaks in order to demonstrate increased independence and improved mobility.    Baseline  (10/26/2017) <25 feet, (11/28/2017), 375 ft    Time  3    Period  Weeks    Status  Achieved        PT Long Term Goals - 01/16/18 1413      PT LONG TERM GOAL #1   Title  Patient will increase Berg Balance score to 46/56 to demonstrate decreased fall risk during functional activities and maintain safety.    Baseline  (10/26/2017) 23/56; 11/28/2017: 39/56; 12/29/17: 42/56    Time  8    Period  Weeks    Status  On-going    Target Date  04/10/18      PT LONG TERM GOAL #2   Title  Patient will complete five times sit to stand test in < 15 seconds (2 MCIDs) indicating increased LE strength and improved balance in order to improve mobility and decrease risk of falls.    Baseline  (10/26/2017) 29 sec; (11/28/2017) 26.8 sec; 12/29/17: 18.6s    Time  12    Period  Weeks    Status  On-going    Target Date  04/10/18      PT LONG TERM GOAL #3   Title  Patient will be independent in bending down towards floor and picking up small object (<5 pounds) and then stand back up without loss of balance in order to return to PLOF and meet demands at work.    Baseline  (10/26/2017) not assessed 2/2 BLT precautions; 11/28/2017: able to pick up a cone w/o UE support, no loss  of balance  Time  12    Period  Weeks    Status  Achieved      PT LONG TERM GOAL #4   Title  Patient will reduce timed up and go to <11 seconds to reduce fall risk and demonstrate improved transfer/gait ability.    Baseline  10/26/2017: not assessed 2/2 to fatigue/endurance; 11/02/17: 43 seconds; 11/28/2017: 25.1 seconds (RW); 12/29/17: 20.0s    Time  12    Period  Weeks    Status  Partially Met    Target Date  04/10/18      PT LONG TERM GOAL #5   Title  Pt will increase 10MWT by at least 0.13 m/s in order to demonstrate clinically significant improvement in community ambulation.     Baseline  12/29/17: self-selected: 18s = 0.56 m/s, fastest: 13.0s = 0.77 m/s    Time  12    Period  Weeks    Status  New    Target Date  04/10/18            Plan - 02/09/18 1747    Clinical Impression Statement  Patient is a little more fatigued for today's session due to later time period. Patient is motivated throughout session and is encouraged by continued strengthening. Hamstring stretch performed with patient demonstrating understanding and implementation use for home.  Pt presents withdeficits in strength, activity tolerance, motor control, and balance and will continue to benefit from skilled therapeutic intervention.     Rehab Potential  Good    PT Frequency  2x / week    PT Duration  12 weeks    PT Treatment/Interventions  Cryotherapy;Moist Heat;Ultrasound;Gait training;Stair training;Functional mobility training;Therapeutic activities;Therapeutic exercise;Balance training;Neuromuscular re-education;Patient/family education;Orthotic Fit/Training;Manual techniques;Scar mobilization;Dry needling;Energy conservation;Electrical Stimulation;Canalith Repostioning;Vestibular    PT Next Visit Plan  Outcome measures/goals, [place] roll for possible R horizontal canal BPPV, BLE strengthening, balance    PT Home Exercise Plan  glute sets, LAQ, sit-to-stand with supervision and RW, standing hip  aBduction (HEP provided at hospital d/c)    Consulted and Agree with Plan of Care  Patient       Patient will benefit from skilled therapeutic intervention in order to improve the following deficits and impairments:  Abnormal gait, Decreased endurance, Impaired sensation, Decreased activity tolerance, Decreased strength, Pain, Decreased balance, Decreased mobility, Difficulty walking, Improper body mechanics, Decreased range of motion, Decreased coordination, Postural dysfunction  Visit Diagnosis: Difficulty in walking, not elsewhere classified  Muscle weakness (generalized)  Dizziness and giddiness     Problem List Patient Active Problem List   Diagnosis Date Noted  . Allergic state 03/14/2017  . Arthritis 03/14/2017  . Hypertension 03/14/2017  . Osteoporosis, post-menopausal 03/14/2017  . Benign paroxysmal positional vertigo due to bilateral vestibular disorder 06/30/2015   Janna Arch, PT, DPT   02/09/2018, 5:49 PM  [place] MAIN [facility] SERVICES [address] [place], [place], [address] Phone: [phone]   Fax:  [phone]  Name: CACIE GASKINS MRN: [id number] Date of Birth: 02/11/1945

## 2018-02-13 ENCOUNTER — Ambulatory Visit: Payer: BLUE CROSS/BLUE SHIELD

## 2018-02-15 ENCOUNTER — Ambulatory Visit: Payer: BLUE CROSS/BLUE SHIELD

## 2018-02-15 DIAGNOSIS — R262 Difficulty in walking, not elsewhere classified: Secondary | ICD-10-CM | POA: Diagnosis not present

## 2018-02-15 DIAGNOSIS — R42 Dizziness and giddiness: Secondary | ICD-10-CM

## 2018-02-15 DIAGNOSIS — M6281 Muscle weakness (generalized): Secondary | ICD-10-CM

## 2018-02-15 NOTE — Therapy (Signed)
[place] MAIN [facility] SERVICES [address] [place], [place], [address] Phone: [phone]   Fax:  [phone]  Physical Therapy Treatment  Patient Details  Name: Jaclyn Hall MRN: [id number] Date of Birth: 10-28-1945 Referring Provider (PT): Merrilee Seashore, NP   Encounter Date: 02/15/2018  PT End of Session - 02/15/18 1612    Visit Number  29    Number of Visits  85    Date for PT Re-Evaluation  04/10/18    Authorization Type  last goals: 12/29/17    PT Start Time  1605    PT Stop Time  1645    PT Time Calculation (min)  40 min    Equipment Utilized During Treatment  Gait belt    Activity Tolerance  Patient tolerated treatment well    Behavior During Therapy  [facility] for tasks assessed/performed       Past Medical History:  Diagnosis Date  . Arthritis   . Asthma   . Colon polyps   . Hypertension   . Osteoporosis   . Vertigo     Past Surgical History:  Procedure Laterality Date  . ABDOMINAL HYSTERECTOMY    . CHOLECYSTECTOMY    . COLONOSCOPY    . COLONOSCOPY N/A 11/22/2014   Procedure: COLONOSCOPY;  Surgeon: Lollie Sails, MD;  Location: [facility] ENDOSCOPY;  Service: Endoscopy;  Laterality: N/A;  . OOPHORECTOMY      There were no vitals filed for this visit.  Subjective Assessment - 02/15/18 1612    Subjective  Pt reports that she is doing well today. She has continued to have knee and hip soreness but not at rest. No specific quesitons or concerns at this time. She believes that her vertigo has improved.     Pertinent History  Patient is 73 year old female with diagnosis of spinal cord compression at T4/5 s/p laminectomy with decreased mobility and function. PMH significant for hypertension, osteoporosis, arthritis, and BPPV.     Limitations  Lifting;House hold activities;Walking;Standing    How long can you sit comfortably?  30 min    How long can you stand comfortably?  <5 min    How long can you walk comfortably?  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  No/denies   No resting pain, only pain in WB           TREATMENT   Therapeutic Exercise: NuStep L2-L4 warm-up with therapist increasing/decreasing resistance based on pt response/fatigue x 5 minutes during history (3 minutes unbilled); Hooklying SLR 2 x 10 bilateral; Hooklying bridges with 3s hold 2 x 10; Hooklying clams with manual resistance 2 x 10; Hooklying adductor squeeze with manual resistance 2 x 10; Manually resisted leg press 2 x 10 bilateral;   Neuromuscular Re-education  Given history of prior positive horizontal canal testing for BPPV retested pt again today. Dix-Hallpike and Roll testing performed with and without IR goggles. Negative in all directions for both vertigo and nystagmus. Pt tested multiple times due to difficulty keeping her eyes open and donning the IR goggles.   Pt educated throughout session about proper posture and technique with exercises. Improved exercise technique, movement at target joints, use of target muscles after min to mod verbal, visual, tactile cues.   She continues todemonstrate excellent motivation during  sessionand is makingexcellent progress with LE strength however reports feeling fatigued today. Given history of prior positive horizontal canal testing for BPPV retested pt again today. Dix-Hallpike and Roll testing performed with and without IR goggles. Negative in all directions for both vertigo and nystagmus. Pt tested multiple times due to difficulty keeping her eyes open and donning the IR goggles. Will progress strength/balance training at next visit and treat vertigo if it recurs.She will need updated outcome measures, goals, and a progress note at next visit. Pt encouraged to continue HEPandfollow-up as scheduled.Pt presents  withdeficits in strength, activity tolerance, motor control, and balance and will continue to benefit from skilled therapeutic intervention.                      PT Short Term Goals - 01/16/18 1413      PT SHORT TERM GOAL #1   Title  Patient will be adherent to HEP at least 3x a week to improve functional strength in BLE and balance in order to decrease risk of falls and maintain safety at home.    Baseline  Walking supported, sit <>stands performed most days per patient reports    Time  3    Period  Weeks    Status  On-going    Target Date  02/27/18      PT SHORT TERM GOAL #2   Title  Patient will complete five times sit to stand test in < 24 seconds (2 MCIDs) indicating increased LE strength and improved balance in order to improve mobility and decrease risk of falls.    Baseline  (10/26/2017) 29 sec; (11/28/2017) 26.8 sec; 12/29/17: 18.6 sec;    Time  3    Period  Weeks    Status  Achieved      PT SHORT TERM GOAL #3   Title  Patient will ambulate with RW, SBA, on level surfaces for >150 feet with no LOB and no sitting breaks in order to demonstrate increased independence and improved mobility.    Baseline  (10/26/2017) <25 feet, (11/28/2017), 375 ft    Time  3    Period  Weeks    Status  Achieved        PT Long Term Goals - 01/16/18 1413      PT LONG TERM GOAL #1   Title  Patient will increase Berg Balance score to 46/56 to demonstrate decreased fall risk during functional activities and maintain safety.    Baseline  (10/26/2017) 23/56; 11/28/2017: 39/56; 12/29/17: 42/56    Time  8    Period  Weeks    Status  On-going    Target Date  04/10/18      PT LONG TERM GOAL #2   Title  Patient will complete five times sit to stand test in < 15 seconds (2 MCIDs) indicating increased LE strength and improved balance in order to improve mobility and decrease risk of falls.    Baseline  (10/26/2017) 29 sec; (11/28/2017) 26.8 sec; 12/29/17: 18.6s    Time  12     Period  Weeks    Status  On-going    Target Date  04/10/18      PT LONG TERM GOAL #3   Title  Patient will be independent in bending down towards floor and picking up small object (<5 pounds) and then stand back up without loss of balance in order to return to PLOF and meet demands at work.    Baseline  (10/26/2017)  not assessed 2/2 BLT precautions; 11/28/2017: able to pick up a cone w/o UE support, no loss of balance    Time  12    Period  Weeks    Status  Achieved      PT LONG TERM GOAL #4   Title  Patient will reduce timed up and go to <11 seconds to reduce fall risk and demonstrate improved transfer/gait ability.    Baseline  10/26/2017: not assessed 2/2 to fatigue/endurance; 11/02/17: 43 seconds; 11/28/2017: 25.1 seconds (RW); 12/29/17: 20.0s    Time  12    Period  Weeks    Status  Partially Met    Target Date  04/10/18      PT LONG TERM GOAL #5   Title  Pt will increase 10MWT by at least 0.13 m/s in order to demonstrate clinically significant improvement in community ambulation.     Baseline  12/29/17: self-selected: 18s = 0.56 m/s, fastest: 13.0s = 0.77 m/s    Time  12    Period  Weeks    Status  New    Target Date  04/10/18            Plan - 02/15/18 1613    Clinical Impression Statement  She continues todemonstrate excellent motivation during sessionand is makingexcellent progress with LE strength however reports feeling fatigued today. Given history of prior positive horizontal canal testing for BPPV retested pt again today. Dix-Hallpike and Roll testing performed with and without IR goggles. Negative in all directions for both vertigo and nystagmus. Pt tested multiple times due to difficulty keeping her eyes open and donning the IR goggles. Will progress strength/balance training at next visit and treat vertigo if it recurs.She will need updated outcome measures, goals, and a progress note at next visit. Pt encouraged to continue HEPandfollow-up as scheduled.Pt  presents withdeficits in strength, activity tolerance, motor control, and balance and will continue to benefit from skilled therapeutic intervention.    Rehab Potential  Good    PT Frequency  2x / week    PT Duration  12 weeks    PT Treatment/Interventions  Cryotherapy;Moist Heat;Ultrasound;Gait training;Stair training;Functional mobility training;Therapeutic activities;Therapeutic exercise;Balance training;Neuromuscular re-education;Patient/family education;Orthotic Fit/Training;Manual techniques;Scar mobilization;Dry needling;Energy conservation;Electrical Stimulation;Canalith Repostioning;Vestibular    PT Next Visit Plan  Outcome measures/goals, progress note, recheck horizontal canal if needed, BLE strengthening, balance    PT Home Exercise Plan  glute sets, LAQ, sit-to-stand with supervision and RW, standing hip aBduction (HEP provided at hospital d/c)    Consulted and Agree with Plan of Care  Patient       Patient will benefit from skilled therapeutic intervention in order to improve the following deficits and impairments:  Abnormal gait, Decreased endurance, Impaired sensation, Decreased activity tolerance, Decreased strength, Pain, Decreased balance, Decreased mobility, Difficulty walking, Improper body mechanics, Decreased range of motion, Decreased coordination, Postural dysfunction  Visit Diagnosis: Difficulty in walking, not elsewhere classified  Muscle weakness (generalized)  Dizziness and giddiness     Problem List Patient Active Problem List   Diagnosis Date Noted  . Allergic state 03/14/2017  . Arthritis 03/14/2017  . Hypertension 03/14/2017  . Osteoporosis, post-menopausal 03/14/2017  . Benign paroxysmal positional vertigo due to bilateral vestibular disorder 06/30/2015   Phillips Grout PT, DPT, GCS  , 02/16/2018, 10:20 AM  [place] MAIN [facility] SERVICES [address] [place], [place], [address] Phone:  [phone]   Fax:  [phone]  Name: Jaclyn Hall MRN: [id number] Date of Birth: [birth year]

## 2018-02-20 ENCOUNTER — Ambulatory Visit: Payer: BLUE CROSS/BLUE SHIELD | Attending: Nurse Practitioner

## 2018-02-20 VITALS — BP 171/58 | HR 78

## 2018-02-20 DIAGNOSIS — M6281 Muscle weakness (generalized): Secondary | ICD-10-CM | POA: Diagnosis present

## 2018-02-20 DIAGNOSIS — R262 Difficulty in walking, not elsewhere classified: Secondary | ICD-10-CM | POA: Insufficient documentation

## 2018-02-20 DIAGNOSIS — R42 Dizziness and giddiness: Secondary | ICD-10-CM

## 2018-02-20 NOTE — Therapy (Signed)
[place] MAIN [facility] SERVICES [address] [place], [place], [address] Phone: [phone]   Fax:  [phone]  Physical Therapy Progress Note   Dates of reporting period  01/06/19   to   02/20/18  Patient Details  Name: Jaclyn Hall MRN: [id number] Date of Birth: 06-16-1945 Referring Provider (PT): Merrilee Seashore, NP   Encounter Date: 02/20/2018  PT End of Session - 02/20/18 1354    Visit Number  30    Number of Visits  49    Date for PT Re-Evaluation  04/10/18    Authorization Type  last goals: 12/29/17, updated today 02/20/18    PT Start Time  1305    PT Stop Time  1350    PT Time Calculation (min)  45 min    Equipment Utilized During Treatment  Gait belt    Activity Tolerance  Patient tolerated treatment well    Behavior During Therapy  WFL for tasks assessed/performed       Past Medical History:  Diagnosis Date  . Arthritis   . Asthma   . Colon polyps   . Hypertension   . Osteoporosis   . Vertigo     Past Surgical History:  Procedure Laterality Date  . ABDOMINAL HYSTERECTOMY    . CHOLECYSTECTOMY    . COLONOSCOPY    . COLONOSCOPY N/A 11/22/2014   Procedure: COLONOSCOPY;  Surgeon: Lollie Sails, MD;  Location: [facility] ENDOSCOPY;  Service: Endoscopy;  Laterality: N/A;  . OOPHORECTOMY      Vitals:   02/20/18 1318  BP: (!) 171/58  Pulse: 78  SpO2: 99%    Subjective Assessment - 02/20/18 1353    Subjective  Pt reports that she is doing well today. She has continued to have knee and hip soreness but not at rest. No specific quesitons or concerns at this time. No further episodes of dizziness or vertigo.     Pertinent History  Patient is 73 year old female with diagnosis of spinal cord compression at T4/5 s/p laminectomy with decreased mobility and function. PMH significant for hypertension, osteoporosis, arthritis, and BPPV.     Limitations  Lifting;House hold activities;Walking;Standing    How long can you sit comfortably?   30 min    How long can you stand comfortably?  <5 min    How long can you walk comfortably?  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  No/denies         [facility] PT Assessment - 02/20/18 1317      6 Minute Walk- Baseline   6 Minute Walk- Baseline  yes    BP (mmHg)  171/58    HR (bpm)  79    02 Sat (%RA)  100 %    Modified Borg Scale for Dyspnea  0- Nothing at all    Perceived Rate of Exertion (Borg)  6-      6 Minute walk- Post Test   6 Minute Walk Post Test  yes    BP (mmHg)  174/65    HR (bpm)  98    02 Sat (%RA)  98 %    Modified Borg Scale for Dyspnea  4- somewhat severe    Perceived Rate of Exertion (Borg)  10-      6 minute  walk test results    Aerobic Endurance Distance Walked  645    Endurance additional comments  Significant foot slap and increased toe drage with fatigue      Standardized Balance Assessment   Standardized Balance Assessment  Five Times Sit to Stand;Berg Balance Test    Five times sit to stand comments   15.9 sec      Berg Balance Test   Sit to Stand  Able to stand without using hands and stabilize independently    Standing Unsupported  Able to stand safely 2 minutes    Sitting with Back Unsupported but Feet Supported on Floor or Stool  Able to sit safely and securely 2 minutes    Stand to Sit  Sits safely with minimal use of hands    Transfers  Able to transfer safely, minor use of hands    Standing Unsupported with Eyes Closed  Able to stand 10 seconds with supervision    Standing Ubsupported with Feet Together  Able to place feet together independently and stand 1 minute safely    From Standing, Reach Forward with Outstretched Arm  Can reach confidently >25 cm (10")    From Standing Position, Pick up Object from Floor  Able to pick up shoe, needs supervision    From Standing Position,  Turn to Look Behind Over each Shoulder  Needs supervision when turning    Turn 360 Degrees  Able to turn 360 degrees safely but slowly    Standing Unsupported, Alternately Place Feet on Step/Stool  Able to complete >2 steps/needs minimal assist    Standing Unsupported, One Foot in Front  Able to plae foot ahead of the other independently and hold 30 seconds    Standing on One Leg  Tries to lift leg/unable to hold 3 seconds but remains standing independently    Total Score  42    Berg comment:  High Fall risk           TREATMENT   Therapeutic Exercise: Gait trainingin parallel bars, unsupported x 10 feet forward/backward x 4 lengths with cues to increase arm swing when going forward for better reciprocal gait pattern; patient able to exhibit improved gait mechanics with arm swing;  Side stepping in parallel bars, unsupported x 10' x 2 lengths, fatigue reported and unable to continue; Performed outcome measures with patient including 5TSTS=15.9s/18.6s, TUG=20.5/20.0s, 18mgait speed = self-selected: 20.2 = 0.50 m/s, fastest: 13.0s = 0.77 m/s, and BERG: 42/56, 6MWT: 645';    Pt educated throughout session about proper posture and technique with exercises. Improved exercise technique, movement at target joints, use of target muscles after min to mod verbal, visual, tactile cues.    Ptcontinues todemonstrate excellent motivation during session and is making progress with respect to her outcome measures. Her BERG has improved from 23/56 at initial evaluation to 42/56 today however this is unchanged from her last update on 12/29/17. Her Five Time Sit to Stand improved from 29 seconds initially to 18.6 in December and 15.9 seconds today. Her TUG decreased from 43 seconds initially to 20 seconds in December and is unchanged today. She was able to walk 654 today during 6MWT and during her initial evaluation in October 2019 she was able to walk less than 25.' Her strength on the leg press  continues to demonstrate improvement. She continues to require use of walker for ambulation secondary to buckling but does spend some time walking around the house without an assistive device.Pt encouraged to continue HEPandfollow-up  as scheduled.Pt presents withdeficits in strength, activity tolerance, motor control, and balance and will continue to benefit from skilled therapeutic intervention.                      PT Short Term Goals - 02/20/18 1314      PT SHORT TERM GOAL #1   Title  Patient will be adherent to HEP at least 3x a week to improve functional strength in BLE and balance in order to decrease risk of falls and maintain safety at home.    Baseline  Walking supported, sit <>stands performed most days per patient reports    Time  3    Period  Weeks    Status  On-going    Target Date  02/27/18      PT SHORT TERM GOAL #2   Title  Patient will complete five times sit to stand test in < 24 seconds (2 MCIDs) indicating increased LE strength and improved balance in order to improve mobility and decrease risk of falls.    Baseline  (10/26/2017) 29 sec; (11/28/2017) 26.8 sec; 12/29/17: 18.6 sec;    Time  3    Period  Weeks    Status  Achieved      PT SHORT TERM GOAL #3   Title  Patient will ambulate with RW, SBA, on level surfaces for >150 feet with no LOB and no sitting breaks in order to demonstrate increased independence and improved mobility.    Baseline  (10/26/2017) <25 feet, (11/28/2017), 375 ft    Time  3    Period  Weeks    Status  Achieved        PT Long Term Goals - 02/20/18 1315      PT LONG TERM GOAL #1   Title  Patient will increase Berg Balance score to 46/56 to demonstrate decreased fall risk during functional activities and maintain safety.    Baseline  (10/26/2017) 23/56; 11/28/2017: 39/56; 12/29/17: 42/56; 02/20/18: 42/56    Time  8    Period  Weeks    Status  On-going    Target Date  04/10/18      PT LONG TERM GOAL #2   Title   Patient will complete five times sit to stand test in < 15 seconds (2 MCIDs) indicating increased LE strength and improved balance in order to improve mobility and decrease risk of falls.    Baseline  (10/26/2017) 29 sec; (11/28/2017) 26.8 sec; 12/29/17: 18.6s; 02/20/18: 15.9s    Time  12    Period  Weeks    Status  On-going    Target Date  04/10/18      PT LONG TERM GOAL #3   Title  Patient will be independent in bending down towards floor and picking up small object (<5 pounds) and then stand back up without loss of balance in order to return to PLOF and meet demands at work.    Baseline  (10/26/2017) not assessed 2/2 BLT precautions; 11/28/2017: able to pick up a cone w/o UE support, no loss of balance    Time  12    Period  Weeks    Status  Achieved      PT LONG TERM GOAL #4   Title  Patient will reduce timed up and go to <11 seconds to reduce fall risk and demonstrate improved transfer/gait ability.    Baseline  10/26/2017: not assessed 2/2 to fatigue/endurance; 11/02/17: 43 seconds; 11/28/2017: 25.1 seconds (RW); 12/29/17:  20.0s; 02/20/18: 20.5s    Time  12    Period  Weeks    Status  Partially Met    Target Date  04/10/18      PT LONG TERM GOAL #5   Title  Pt will increase 10MWT by at least 0.13 m/s in order to demonstrate clinically significant improvement in community ambulation.     Baseline  12/29/17: self-selected: 18s = 0.56 m/s, fastest: 13.0s = 0.77 m/s; 02/20/18: self-selected: 20.2 = 0.50 m/s, fastest: 13.0s = 0.77 m/s    Time  12    Period  Weeks    Status  On-going    Target Date  04/10/18      Additional Long Term Goals   Additional Long Term Goals  Yes      PT LONG TERM GOAL #6   Title  Pt will increase 6MWT by at least 5m(1658f in order to demonstrate clinically significant improvement in cardiopulmonary endurance and community ambulation     Baseline  02/20/18: 645'    Time  8    Period  Weeks    Status  New    Target Date  04/10/18            Plan -  02/20/18 1354    Clinical Impression Statement  Ptcontinues todemonstrate excellent motivation during session and is making progress with respect to her outcome measures. Her BERG has improved from 23/56 at initial evaluation to 42/56 today however this is unchanged from her last update on 12/29/17. Her Five Time Sit to Stand improved from 29 seconds initially to 18.6 in December and 15.9 seconds today. Her TUG decreased from 43 seconds initially to 20 seconds in December and is unchanged today. She was able to walk 6491today during 6MWT and during her initial evaluation in October 2019 she was able to walk less than 25.' Her strength on the leg press continues to demonstrate improvement. She continues to require use of walker for ambulation secondary to buckling but does spend some time walking around the house without an assistive device.Pt encouraged to continue HEPandfollow-up as scheduled.Pt presents withdeficits in strength, activity tolerance, motor control, and balance and will continue to benefit from skilled therapeutic intervention.    Rehab Potential  Good    PT Frequency  2x / week    PT Duration  12 weeks    PT Treatment/Interventions  Cryotherapy;Moist Heat;Ultrasound;Gait training;Stair training;Functional mobility training;Therapeutic activities;Therapeutic exercise;Balance training;Neuromuscular re-education;Patient/family education;Orthotic Fit/Training;Manual techniques;Scar mobilization;Dry needling;Energy conservation;Electrical Stimulation;Canalith Repostioning;Vestibular    PT Next Visit Plan  BLE strengthening, balance, Recheck horizontal canal if needed,     PT Home Exercise Plan  glute sets, LAQ, sit-to-stand with supervision and RW, standing hip aBduction (HEP provided at hospital d/c)    Consulted and Agree with Plan of Care  Patient       Patient will benefit from skilled therapeutic intervention in order to improve the following deficits and impairments:  Abnormal  gait, Decreased endurance, Impaired sensation, Decreased activity tolerance, Decreased strength, Pain, Decreased balance, Decreased mobility, Difficulty walking, Improper body mechanics, Decreased range of motion, Decreased coordination, Postural dysfunction  Visit Diagnosis: Difficulty in walking, not elsewhere classified  Muscle weakness (generalized)  Dizziness and giddiness     Problem List Patient Active Problem List   Diagnosis Date Noted  . Allergic state 03/14/2017  . Arthritis 03/14/2017  . Hypertension 03/14/2017  . Osteoporosis, post-menopausal 03/14/2017  . Benign paroxysmal positional vertigo due to bilateral vestibular disorder 06/30/2015  Phillips Grout PT, DPT, GCS  , 02/20/2018, 2:02 PM  [place] MAIN [facility] SERVICES [address] [place], [place], [address] Phone: [phone]   Fax:  [phone]  Name: Jaclyn Hall MRN: [id number] Date of Birth: 02/06/1945

## 2018-02-22 ENCOUNTER — Ambulatory Visit: Payer: BLUE CROSS/BLUE SHIELD

## 2018-02-22 DIAGNOSIS — M6281 Muscle weakness (generalized): Secondary | ICD-10-CM

## 2018-02-22 DIAGNOSIS — R262 Difficulty in walking, not elsewhere classified: Secondary | ICD-10-CM | POA: Diagnosis not present

## 2018-02-22 NOTE — Therapy (Signed)
[place] MAIN [facility] SERVICES [address] [place], [place], [address] Phone: [phone]   Fax:  [phone]  Physical Therapy Treatment  Patient Details  Name: Jaclyn Hall MRN: [id number] Date of Birth: 28-Jul-1945 Referring Provider (PT): Merrilee Seashore, NP   Encounter Date: 02/22/2018  PT End of Session - 02/22/18 1402    Visit Number  31    Number of Visits  59    Date for PT Re-Evaluation  04/10/18    Authorization Type  last goals: 02/20/18    PT Start Time  1350    PT Stop Time  1430    PT Time Calculation (min)  40 min    Equipment Utilized During Treatment  Gait belt    Activity Tolerance  Patient tolerated treatment well    Behavior During Therapy  [facility] for tasks assessed/performed       Past Medical History:  Diagnosis Date  . Arthritis   . Asthma   . Colon polyps   . Hypertension   . Osteoporosis   . Vertigo     Past Surgical History:  Procedure Laterality Date  . ABDOMINAL HYSTERECTOMY    . CHOLECYSTECTOMY    . COLONOSCOPY    . COLONOSCOPY N/A 11/22/2014   Procedure: COLONOSCOPY;  Surgeon: Lollie Sails, MD;  Location: [facility] ENDOSCOPY;  Service: Endoscopy;  Laterality: N/A;  . OOPHORECTOMY      There were no vitals filed for this visit.  Subjective Assessment - 02/22/18 1401    Subjective  Pt reports that she is doing well today. She has continued to have knee and hip soreness but not at rest. No specific quesitons or concerns at this time. Reports compliance with HEP.     Pertinent History  Patient is 73 year old female with diagnosis of spinal cord compression at T4/5 s/p laminectomy with decreased mobility and function. PMH significant for hypertension, osteoporosis, arthritis, and BPPV.     Limitations  Lifting;House hold activities;Walking;Standing    How long can you sit comfortably?  30 min    How long can you stand comfortably?  <5 min    How long can you walk comfortably?  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  No/denies           TREATMENT   Therapeutic Exercise: Seated clams with green tband x 15; Seated hip marching with green tband x 15; Sit to stand from regular height chair without UE support x 10 (encouraged pt to add to HEP); Standing mini squats x 10 with faded UE support (encouraged pt to add to HEP); Seated LAQ with green tband x 15 bilateral; Quantum single leg press 80#x 20, 90# RLE x 14, LLE x 90# x 10with minVCs to slow down LE movement especially eccentric return for better motor control and strengthening,very minor assist provided for LLE;   Neuromuscular Re-education  Forward/backward ambulation in // bars without UE support x 1 length each; Forward/backward ambulation in // bars with eyes closed without UE support x 2 lengths each; Semitandem progressing closer to full tandem alternating forward LE 30s x 2 each side;   Pt educated throughout session about proper posture and technique with exercises. Improved exercise technique, movement at target joints, use of target muscles after min  to mod verbal, visual, tactile cues.   Reviewed HEP with patient and provided corrections to technique. Added sit to stand and mini squats to HEP. Pt is able to increase weight on leg press today. She struggles with balance in semitandem/tandem stance. Pt encouraged to continue HEPandfollow-up as scheduled.Pt presents withdeficits in strength, activity tolerance, motor control, and balance and will continue to benefit from skilled therapeutic intervention.                        PT Short Term Goals - 02/20/18 1314      PT SHORT TERM GOAL #1   Title  Patient will be adherent to HEP at least 3x a week to improve functional strength in BLE and balance in order to decrease risk  of falls and maintain safety at home.    Baseline  Walking supported, sit <>stands performed most days per patient reports    Time  3    Period  Weeks    Status  On-going    Target Date  02/27/18      PT SHORT TERM GOAL #2   Title  Patient will complete five times sit to stand test in < 24 seconds (2 MCIDs) indicating increased LE strength and improved balance in order to improve mobility and decrease risk of falls.    Baseline  (10/26/2017) 29 sec; (11/28/2017) 26.8 sec; 12/29/17: 18.6 sec;    Time  3    Period  Weeks    Status  Achieved      PT SHORT TERM GOAL #3   Title  Patient will ambulate with RW, SBA, on level surfaces for >150 feet with no LOB and no sitting breaks in order to demonstrate increased independence and improved mobility.    Baseline  (10/26/2017) <25 feet, (11/28/2017), 375 ft    Time  3    Period  Weeks    Status  Achieved        PT Long Term Goals - 02/20/18 1315      PT LONG TERM GOAL #1   Title  Patient will increase Berg Balance score to 46/56 to demonstrate decreased fall risk during functional activities and maintain safety.    Baseline  (10/26/2017) 23/56; 11/28/2017: 39/56; 12/29/17: 42/56; 02/20/18: 42/56    Time  8    Period  Weeks    Status  On-going    Target Date  04/10/18      PT LONG TERM GOAL #2   Title  Patient will complete five times sit to stand test in < 15 seconds (2 MCIDs) indicating increased LE strength and improved balance in order to improve mobility and decrease risk of falls.    Baseline  (10/26/2017) 29 sec; (11/28/2017) 26.8 sec; 12/29/17: 18.6s; 02/20/18: 15.9s    Time  12    Period  Weeks    Status  On-going    Target Date  04/10/18      PT LONG TERM GOAL #3   Title  Patient will be independent in bending down towards floor and picking up small object (<5 pounds) and then stand back up without loss of balance in order to return to PLOF and meet demands at work.    Baseline  (10/26/2017) not assessed 2/2 BLT precautions;  11/28/2017: able to pick up a cone w/o UE support, no loss of balance    Time  12    Period  Weeks    Status  Achieved  PT LONG TERM GOAL #4   Title  Patient will reduce timed up and go to <11 seconds to reduce fall risk and demonstrate improved transfer/gait ability.    Baseline  10/26/2017: not assessed 2/2 to fatigue/endurance; 11/02/17: 43 seconds; 11/28/2017: 25.1 seconds (RW); 12/29/17: 20.0s; 02/20/18: 20.5s    Time  12    Period  Weeks    Status  Partially Met    Target Date  04/10/18      PT LONG TERM GOAL #5   Title  Pt will increase 10MWT by at least 0.13 m/s in order to demonstrate clinically significant improvement in community ambulation.     Baseline  12/29/17: self-selected: 18s = 0.56 m/s, fastest: 13.0s = 0.77 m/s; 02/20/18: self-selected: 20.2 = 0.50 m/s, fastest: 13.0s = 0.77 m/s    Time  12    Period  Weeks    Status  On-going    Target Date  04/10/18      Additional Long Term Goals   Additional Long Term Goals  Yes      PT LONG TERM GOAL #6   Title  Pt will increase 6MWT by at least 68m(1615f in order to demonstrate clinically significant improvement in cardiopulmonary endurance and community ambulation     Baseline  02/20/18: 645'    Time  8    Period  Weeks    Status  New    Target Date  04/10/18            Plan - 02/22/18 1402    Clinical Impression Statement  Reviewed HEP with patient and provided corrections to technique. Added sit to stand and mini squats to HEP. Pt is able to increase weight on leg press today. She struggles with balance in semitandem/tandem stance. Pt encouraged to continue HEPandfollow-up as scheduled.Pt presents withdeficits in strength, activity tolerance, motor control, and balance and will continue to benefit from skilled therapeutic intervention.    Rehab Potential  Good    PT Frequency  2x / week    PT Duration  12 weeks    PT Treatment/Interventions  Cryotherapy;Moist Heat;Ultrasound;Gait training;Stair  training;Functional mobility training;Therapeutic activities;Therapeutic exercise;Balance training;Neuromuscular re-education;Patient/family education;Orthotic Fit/Training;Manual techniques;Scar mobilization;Dry needling;Energy conservation;Electrical Stimulation;Canalith Repostioning;Vestibular    PT Next Visit Plan  BLE strengthening, balance, Recheck horizontal canal if needed,     PT Home Exercise Plan  glute sets, LAQ, sit-to-stand with supervision and RW, standing hip aBduction (HEP provided at hospital d/c)    Consulted and Agree with Plan of Care  Patient       Patient will benefit from skilled therapeutic intervention in order to improve the following deficits and impairments:  Abnormal gait, Decreased endurance, Impaired sensation, Decreased activity tolerance, Decreased strength, Pain, Decreased balance, Decreased mobility, Difficulty walking, Improper body mechanics, Decreased range of motion, Decreased coordination, Postural dysfunction  Visit Diagnosis: Difficulty in walking, not elsewhere classified  Muscle weakness (generalized)     Problem List Patient Active Problem List   Diagnosis Date Noted  . Allergic state 03/14/2017  . Arthritis 03/14/2017  . Hypertension 03/14/2017  . Osteoporosis, post-menopausal 03/14/2017  . Benign paroxysmal positional vertigo due to bilateral vestibular disorder 06/30/2015   JaPhillips GroutT, DPT, GCS  , 02/22/2018, 3:52 PM  [place] [facility] [address] [id number]: [phone] Fax:  [phone]: BrKAALIYAH KITARN: [id number] of Birth: 8/06-05-1945

## 2018-02-27 ENCOUNTER — Ambulatory Visit: Payer: BLUE CROSS/BLUE SHIELD

## 2018-02-27 DIAGNOSIS — M6281 Muscle weakness (generalized): Secondary | ICD-10-CM

## 2018-02-27 DIAGNOSIS — R262 Difficulty in walking, not elsewhere classified: Secondary | ICD-10-CM

## 2018-02-27 DIAGNOSIS — R42 Dizziness and giddiness: Secondary | ICD-10-CM

## 2018-02-27 NOTE — Therapy (Signed)
[place] MAIN [facility] SERVICES [address] [place], [place], [address] Phone: [phone]   Fax:  [phone]  Physical Therapy Treatment  Patient Details  Name: Jaclyn Hall MRN: [id number] Date of Birth: 05/02/1945 Referring Provider (PT): Merrilee Seashore, NP   Encounter Date: 02/27/2018  PT End of Session - 02/27/18 1043    Visit Number  32    Number of Visits  49    Date for PT Re-Evaluation  04/10/18    Authorization Type  last goals: 02/20/18    PT Start Time  1036    PT Stop Time  1115    PT Time Calculation (min)  39 min    Equipment Utilized During Treatment  Gait belt    Activity Tolerance  Patient tolerated treatment well    Behavior During Therapy  [facility] for tasks assessed/performed       Past Medical History:  Diagnosis Date  . Arthritis   . Asthma   . Colon polyps   . Hypertension   . Osteoporosis   . Vertigo     Past Surgical History:  Procedure Laterality Date  . ABDOMINAL HYSTERECTOMY    . CHOLECYSTECTOMY    . COLONOSCOPY    . COLONOSCOPY N/A 11/22/2014   Procedure: COLONOSCOPY;  Surgeon: Lollie Sails, MD;  Location: [facility] ENDOSCOPY;  Service: Endoscopy;  Laterality: N/A;  . OOPHORECTOMY      There were no vitals filed for this visit.  Subjective Assessment - 02/27/18 1043    Subjective  Pt reports that she is doing well today. Knee soreness is improving. Pt reports that she has been wearing jeans more often and she believes that the compression helps. No specific quesitons or concerns at this time. Reports compliance with HEP.     Pertinent History  Patient is 73 year old female with diagnosis of spinal cord compression at T4/5 s/p laminectomy with decreased mobility and function. PMH significant for hypertension, osteoporosis, arthritis, and BPPV.     Limitations  Lifting;House hold activities;Walking;Standing    How long can you sit comfortably?  30 min    How long can you stand comfortably?  <5 min    How  long can you walk comfortably?  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  No/denies         TREATMENT   Therapeutic Exercise: NuStepL2-L4 warm-up with therapist increasing/decreasing resistance based on pt response/fatigue x 5 minutes during history (3 minutes unbilled); Hooklying SLR x 10 bilateral; Hooklying bridges with 3s hold x 10; Hooklying clams with manual resistance x 10; Hooklying adductor squeeze with manual resistance x 10; Hooklying SLR hip abduction with manual resistance at ankle x 10 bilaterally; Supine manually resisted leg press x 10 bilateral; Sit to stand from low mat table without UE support x 10;   Canalith Repositioning Maneuver Tested horizontal and posterior canal with roll test and Dix-Hallpike respectively due to complaints of "eyes flickering" when rolling in bed. Negative horizontal and L posterior canal. Positive Dix-Hallpike for possible R posterior canal with upbeating R torsional nystagmus of 15 second duration with concurrent vertigo. Pt treated with 2 rounds of CRT with 1 minute holds in each position and retesting between maneuvers. Testing is positive between maneuvers but nystagmus is much less vigorous  and vertigo severity has decreased.    Pt educated throughout session about proper posture and technique with exercises. Improved exercise technique, movement at target joints, use of target muscles after min to mod verbal, visual, tactile cues.   Tested horizontal and posterior canal with roll test and Dix-Hallpike respectively due to complaints of "eyes flickering" when rolling in bed. Negative horizontal and L posterior canal. Positive Dix-Hallpike for possible R posterior canal with upbeating R torsional nystagmus of 15 second duration with concurrent vertigo. Pt treated with 2  rounds of CRT with 1 minute holds in each position and retesting between maneuvers. Testing is positive between maneuvers but nystagmus is much less vigorous and vertigo severity has decreased. Pt is able to complete all supine exercises as instructed during session. Will recheck for BPPV at follow-up visit and treat as appropriate. Pt encouraged to continue HEPandfollow-up as scheduled.Pt presents withdeficits in strength, activity tolerance, motor control, and balance and will continue to benefit from skilled therapeutic intervention.                         PT Short Term Goals - 02/20/18 1314      PT SHORT TERM GOAL #1   Title  Patient will be adherent to HEP at least 3x a week to improve functional strength in BLE and balance in order to decrease risk of falls and maintain safety at home.    Baseline  Walking supported, sit <>stands performed most days per patient reports    Time  3    Period  Weeks    Status  On-going    Target Date  02/27/18      PT SHORT TERM GOAL #2   Title  Patient will complete five times sit to stand test in < 24 seconds (2 MCIDs) indicating increased LE strength and improved balance in order to improve mobility and decrease risk of falls.    Baseline  (10/26/2017) 29 sec; (11/28/2017) 26.8 sec; 12/29/17: 18.6 sec;    Time  3    Period  Weeks    Status  Achieved      PT SHORT TERM GOAL #3   Title  Patient will ambulate with RW, SBA, on level surfaces for >150 feet with no LOB and no sitting breaks in order to demonstrate increased independence and improved mobility.    Baseline  (10/26/2017) <25 feet, (11/28/2017), 375 ft    Time  3    Period  Weeks    Status  Achieved        PT Long Term Goals - 02/20/18 1315      PT LONG TERM GOAL #1   Title  Patient will increase Berg Balance score to 46/56 to demonstrate decreased fall risk during functional activities and maintain safety.    Baseline  (10/26/2017) 23/56; 11/28/2017: 39/56;  12/29/17: 42/56; 02/20/18: 42/56    Time  8    Period  Weeks    Status  On-going    Target Date  04/10/18      PT LONG TERM GOAL #2   Title  Patient will complete five times sit to stand test in < 15 seconds (2 MCIDs) indicating increased LE strength and improved balance in order to improve mobility and decrease risk of falls.    Baseline  (10/26/2017) 29 sec; (11/28/2017) 26.8 sec; 12/29/17: 18.6s; 02/20/18: 15.9s    Time  12    Period  Weeks    Status  On-going  Target Date  04/10/18      PT LONG TERM GOAL #3   Title  Patient will be independent in bending down towards floor and picking up small object (<5 pounds) and then stand back up without loss of balance in order to return to PLOF and meet demands at work.    Baseline  (10/26/2017) not assessed 2/2 BLT precautions; 11/28/2017: able to pick up a cone w/o UE support, no loss of balance    Time  12    Period  Weeks    Status  Achieved      PT LONG TERM GOAL #4   Title  Patient will reduce timed up and go to <11 seconds to reduce fall risk and demonstrate improved transfer/gait ability.    Baseline  10/26/2017: not assessed 2/2 to fatigue/endurance; 11/02/17: 43 seconds; 11/28/2017: 25.1 seconds (RW); 12/29/17: 20.0s; 02/20/18: 20.5s    Time  12    Period  Weeks    Status  Partially Met    Target Date  04/10/18      PT LONG TERM GOAL #5   Title  Pt will increase 10MWT by at least 0.13 m/s in order to demonstrate clinically significant improvement in community ambulation.     Baseline  12/29/17: self-selected: 18s = 0.56 m/s, fastest: 13.0s = 0.77 m/s; 02/20/18: self-selected: 20.2 = 0.50 m/s, fastest: 13.0s = 0.77 m/s    Time  12    Period  Weeks    Status  On-going    Target Date  04/10/18      Additional Long Term Goals   Additional Long Term Goals  Yes      PT LONG TERM GOAL #6   Title  Pt will increase 6MWT by at least 44m(166f in order to demonstrate clinically significant improvement in cardiopulmonary endurance and  community ambulation     Baseline  02/20/18: 645'    Time  8    Period  Weeks    Status  New    Target Date  04/10/18            Plan - 02/27/18 1044    Clinical Impression Statement  Tested horizontal and posterior canal with roll test and Dix-Hallpike respectively due to complaints of "eyes flickering" when rolling in bed. Negative horizontal and L posterior canal. Positive Dix-Hallpike for possible R posterior canal with upbeating R torsional nystagmus of 15 second duration with concurrent vertigo. Pt treated with 2 rounds of CRT with 1 minute holds in each position and retesting between maneuvers. Testing is positive between maneuvers but nystagmus is much less vigorous and vertigo severity has decreased. Pt is able to complete all supine exercises as instructed during session. Will recheck for BPPV at follow-up visit and treat as appropriate. Pt encouraged to continue HEPandfollow-up as scheduled.Pt presents withdeficits in strength, activity tolerance, motor control, and balance and will continue to benefit from skilled therapeutic intervention.    Rehab Potential  Good    PT Frequency  2x / week    PT Duration  12 weeks    PT Treatment/Interventions  Cryotherapy;Moist Heat;Ultrasound;Gait training;Stair training;Functional mobility training;Therapeutic activities;Therapeutic exercise;Balance training;Neuromuscular re-education;Patient/family education;Orthotic Fit/Training;Manual techniques;Scar mobilization;Dry needling;Energy conservation;Electrical Stimulation;Canalith Repostioning;Vestibular    PT Next Visit Plan  BLE strengthening, balance, Recheck for BPPV as needed    PT Home Exercise Plan  glute sets, LAQ, sit-to-stand with supervision and RW, standing hip aBduction (HEP provided at hospital d/c)    Consulted and Agree with Plan of Care  Patient       Patient will benefit from skilled therapeutic intervention in order to improve the following deficits and impairments:   Abnormal gait, Decreased endurance, Impaired sensation, Decreased activity tolerance, Decreased strength, Pain, Decreased balance, Decreased mobility, Difficulty walking, Improper body mechanics, Decreased range of motion, Decreased coordination, Postural dysfunction  Visit Diagnosis: Difficulty in walking, not elsewhere classified  Muscle weakness (generalized)  Dizziness and giddiness     Problem List Patient Active Problem List   Diagnosis Date Noted  . Allergic state 03/14/2017  . Arthritis 03/14/2017  . Hypertension 03/14/2017  . Osteoporosis, post-menopausal 03/14/2017  . Benign paroxysmal positional vertigo due to bilateral vestibular disorder 06/30/2015   Phillips Grout PT, DPT, GCS  Huprich,Jason 02/27/2018, 8:57 PM  [place] MAIN [facility] SERVICES [address] [place], [place], [address] Phone: [phone]   Fax:  [phone]  Name: YARELIE HAMS MRN: [id number] Date of Birth: [birth year]

## 2018-03-02 ENCOUNTER — Ambulatory Visit: Payer: BLUE CROSS/BLUE SHIELD

## 2018-03-02 DIAGNOSIS — M6281 Muscle weakness (generalized): Secondary | ICD-10-CM

## 2018-03-02 DIAGNOSIS — R262 Difficulty in walking, not elsewhere classified: Secondary | ICD-10-CM

## 2018-03-02 NOTE — Therapy (Signed)
[place] MAIN [facility] SERVICES [address] [place], [place], [address] Phone: [phone]   Fax:  [phone]  Physical Therapy Treatment  Patient Details  Name: Jaclyn Hall MRN: [id number] Date of Birth: 11/10/1945 Referring Provider (PT): Merrilee Seashore, NP   Encounter Date: 03/02/2018  PT End of Session - 03/02/18 1307    Visit Number  33    Number of Visits  49    Date for PT Re-Evaluation  04/10/18    Authorization Type  last goals: 02/20/18    PT Start Time  1303    PT Stop Time  1345    PT Time Calculation (min)  42 min    Equipment Utilized During Treatment  Gait belt    Activity Tolerance  Patient tolerated treatment well    Behavior During Therapy  [facility] for tasks assessed/performed       Past Medical History:  Diagnosis Date  . Arthritis   . Asthma   . Colon polyps   . Hypertension   . Osteoporosis   . Vertigo     Past Surgical History:  Procedure Laterality Date  . ABDOMINAL HYSTERECTOMY    . CHOLECYSTECTOMY    . COLONOSCOPY    . COLONOSCOPY N/A 11/22/2014   Procedure: COLONOSCOPY;  Surgeon: Lollie Sails, MD;  Location: [facility] ENDOSCOPY;  Service: Endoscopy;  Laterality: N/A;  . OOPHORECTOMY      There were no vitals filed for this visit.  Subjective Assessment - 03/02/18 1307    Subjective  Pt reports that she is doing well today. No pain reported upon arrival. No specific questions or concerns. She states that her husband helped her stretch some which helped with her hip and knee pain.    Pertinent History  Patient is 73 year old female with diagnosis of spinal cord compression at T4/5 s/p laminectomy with decreased mobility and function. PMH significant for hypertension, osteoporosis, arthritis, and BPPV.     Limitations  Lifting;House hold activities;Walking;Standing    How long can you sit comfortably?  30 min    How long can you stand comfortably?  <5 min    How long can you walk comfortably?  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  No/denies           TREATMENT   Therapeutic Exercise: NuStepL2-L4warm-up with therapist increasing/decreasing resistance based on pt response/fatigue x 5 minutes during history (3 minutes unbilled); Quantumsingleleg press 90# RLE x 15, x 10, LLE x 90# 2 x 15with minVCs to slow down LE movement especially eccentric return for better motor control and strengthening,very minor assist provided for LLE;   Gait Training Gait trainingwith lofstrand crutches500'in hallway.Ptagainrequires some cues initially for sequencing as well as intermittently throughout distance to correct. Horizontal head turns performed as well as multitalking and gait speed changes. She does get fatigued during ambulation, no seated rest break this session during ambulation, just after.    Canalith Repositioning Maneuver Tested horizontal and posterior canal with roll test and Dix-Hallpike respectively due to complaints of some persistent vertigo. Pt appears to have some slight horizontal beating nystagmus in both R and L Dix-Hallpike positions. Roll test confirms horizontal geotrophic nystagmus bilaterally which appears worse when head is turned to the left. Pt treated for possible  L horizontal canal canalithiasis with Lempert roll. One minute holds in each position. Roll testing after treatment is negative bilaterally.    Pt educated throughout session about proper posture and technique with exercises. Improved exercise technique, movement at target joints, use of target muscles after min to mod verbal, visual, tactile cues.   Pt is able to increase repetitions today with single leg press. She is also able to complete a full lap in the hallway around the rehab gym. She expresses an interest in aquatic  therapy so had administrator add some appointments to her schedule. Treated pt today for possible L horizontal canalithiasis with resolution during retesting. Will recheck for BPPV at follow-up visit and treat as appropriate. Pt encouraged to continue HEPandfollow-up as scheduled.Pt presents withdeficits in strength, activity tolerance, motor control, and balance and will continue to benefit from skilled therapeutic intervention.                       PT Short Term Goals - 02/20/18 1314      PT SHORT TERM GOAL #1   Title  Patient will be adherent to HEP at least 3x a week to improve functional strength in BLE and balance in order to decrease risk of falls and maintain safety at home.    Baseline  Walking supported, sit <>stands performed most days per patient reports    Time  3    Period  Weeks    Status  On-going    Target Date  02/27/18      PT SHORT TERM GOAL #2   Title  Patient will complete five times sit to stand test in < 24 seconds (2 MCIDs) indicating increased LE strength and improved balance in order to improve mobility and decrease risk of falls.    Baseline  (10/26/2017) 29 sec; (11/28/2017) 26.8 sec; 12/29/17: 18.6 sec;    Time  3    Period  Weeks    Status  Achieved      PT SHORT TERM GOAL #3   Title  Patient will ambulate with RW, SBA, on level surfaces for >150 feet with no LOB and no sitting breaks in order to demonstrate increased independence and improved mobility.    Baseline  (10/26/2017) <25 feet, (11/28/2017), 375 ft    Time  3    Period  Weeks    Status  Achieved        PT Long Term Goals - 02/20/18 1315      PT LONG TERM GOAL #1   Title  Patient will increase Berg Balance score to 46/56 to demonstrate decreased fall risk during functional activities and maintain safety.    Baseline  (10/26/2017) 23/56; 11/28/2017: 39/56; 12/29/17: 42/56; 02/20/18: 42/56    Time  8    Period  Weeks    Status  On-going    Target Date  04/10/18       PT LONG TERM GOAL #2   Title  Patient will complete five times sit to stand test in < 15 seconds (2 MCIDs) indicating increased LE strength and improved balance in order to improve mobility and decrease risk of falls.    Baseline  (10/26/2017) 29 sec; (11/28/2017) 26.8 sec; 12/29/17: 18.6s; 02/20/18: 15.9s    Time  12    Period  Weeks    Status  On-going    Target Date  04/10/18      PT LONG TERM GOAL #3   Title  Patient will be  independent in bending down towards floor and picking up small object (<5 pounds) and then stand back up without loss of balance in order to return to PLOF and meet demands at work.    Baseline  (10/26/2017) not assessed 2/2 BLT precautions; 11/28/2017: able to pick up a cone w/o UE support, no loss of balance    Time  12    Period  Weeks    Status  Achieved      PT LONG TERM GOAL #4   Title  Patient will reduce timed up and go to <11 seconds to reduce fall risk and demonstrate improved transfer/gait ability.    Baseline  10/26/2017: not assessed 2/2 to fatigue/endurance; 11/02/17: 43 seconds; 11/28/2017: 25.1 seconds (RW); 12/29/17: 20.0s; 02/20/18: 20.5s    Time  12    Period  Weeks    Status  Partially Met    Target Date  04/10/18      PT LONG TERM GOAL #5   Title  Pt will increase 10MWT by at least 0.13 m/s in order to demonstrate clinically significant improvement in community ambulation.     Baseline  12/29/17: self-selected: 18s = 0.56 m/s, fastest: 13.0s = 0.77 m/s; 02/20/18: self-selected: 20.2 = 0.50 m/s, fastest: 13.0s = 0.77 m/s    Time  12    Period  Weeks    Status  On-going    Target Date  04/10/18      Additional Long Term Goals   Additional Long Term Goals  Yes      PT LONG TERM GOAL #6   Title  Pt will increase 6MWT by at least 76m(164f in order to demonstrate clinically significant improvement in cardiopulmonary endurance and community ambulation     Baseline  02/20/18: 645'    Time  8    Period  Weeks    Status  New    Target Date  04/10/18             Plan - 03/02/18 1308    Clinical Impression Statement  Pt is able to increase repetitions today with single leg press. She is also able to complete a full lap in the hallway around the rehab gym. She expresses an interest in aquatic therapy so had administrator add some appointments to her schedule. Treated pt today for possible L horizontal canalithiasis with resolution during retesting. Will recheck for BPPV at follow-up visit and treat as appropriate. Pt encouraged to continue HEPandfollow-up as scheduled.Pt presents withdeficits in strength, activity tolerance, motor control, and balance and will continue to benefit from skilled therapeutic intervention.    Rehab Potential  Good    PT Frequency  2x / week    PT Duration  12 weeks    PT Treatment/Interventions  Cryotherapy;Moist Heat;Ultrasound;Gait training;Stair training;Functional mobility training;Therapeutic activities;Therapeutic exercise;Balance training;Neuromuscular re-education;Patient/family education;Orthotic Fit/Training;Manual techniques;Scar mobilization;Dry needling;Energy conservation;Electrical Stimulation;Canalith Repostioning;Vestibular    PT Next Visit Plan  BLE strengthening, balance, Recheck for BPPV as needed    PT Home Exercise Plan  glute sets, LAQ, sit-to-stand with supervision and RW, standing hip aBduction (HEP provided at hospital d/c)    Consulted and Agree with Plan of Care  Patient       Patient will benefit from skilled therapeutic intervention in order to improve the following deficits and impairments:  Abnormal gait, Decreased endurance, Impaired sensation, Decreased activity tolerance, Decreased strength, Pain, Decreased balance, Decreased mobility, Difficulty walking, Improper body mechanics, Decreased range of motion, Decreased coordination, Postural dysfunction  Visit Diagnosis: Difficulty  in walking, not elsewhere classified  Muscle weakness (generalized)     Problem  List Patient Active Problem List   Diagnosis Date Noted  . Allergic state 03/14/2017  . Arthritis 03/14/2017  . Hypertension 03/14/2017  . Osteoporosis, post-menopausal 03/14/2017  . Benign paroxysmal positional vertigo due to bilateral vestibular disorder 06/30/2015   Phillips Grout PT, DPT, GCS  Huprich,Jason 03/02/2018, 3:45 PM  [place] MAIN [facility] SERVICES [address] [place], [place], [address] Phone: [phone]   Fax:  [phone]  Name: Jaclyn Hall MRN: [id number] Date of Birth: [birth year]

## 2018-03-07 ENCOUNTER — Ambulatory Visit: Payer: BLUE CROSS/BLUE SHIELD

## 2018-03-07 DIAGNOSIS — R262 Difficulty in walking, not elsewhere classified: Secondary | ICD-10-CM | POA: Diagnosis not present

## 2018-03-07 DIAGNOSIS — R42 Dizziness and giddiness: Secondary | ICD-10-CM

## 2018-03-07 DIAGNOSIS — M6281 Muscle weakness (generalized): Secondary | ICD-10-CM

## 2018-03-07 NOTE — Patient Instructions (Signed)
Access Code: [email or web address]  URL: [email or web address]  Date: 03/07/2018  Prepared by: Roxana Hires   Exercises  Hooklying Single Knee to Chest Stretch - 3 reps - 30 seconds hold - 1x daily - 7x weekly  Supine Hip External Rotation - 3 reps - 30 seconds hold - 1x daily - 7x weekly  Supine Piriformis Stretch with Leg Straight - 3 reps - 30 seconds hold - 1x daily - 7x weekly  Supine Hamstring Stretch with Caregiver - 3 reps - 30 seconds hold - 1x daily - 7x weekly

## 2018-03-07 NOTE — Therapy (Signed)
[place] MAIN [facility] SERVICES [address] [place], [place], [address] Phone: [phone]   Fax:  [phone]  Physical Therapy Treatment  Patient Details  Name: Jaclyn Hall MRN: [id number] Date of Birth: 09-Oct-1945 Referring Provider (PT): Merrilee Seashore, NP   Encounter Date: 03/07/2018  PT End of Session - 03/07/18 1309    Visit Number  34    Number of Visits  66    Date for PT Re-Evaluation  04/10/18    Authorization Type  last goals: 02/20/18    PT Start Time  1305    PT Stop Time  1345    PT Time Calculation (min)  40 min    Equipment Utilized During Treatment  Gait belt    Activity Tolerance  Patient tolerated treatment well    Behavior During Therapy  [facility] for tasks assessed/performed       Past Medical History:  Diagnosis Date  . Arthritis   . Asthma   . Colon polyps   . Hypertension   . Osteoporosis   . Vertigo     Past Surgical History:  Procedure Laterality Date  . ABDOMINAL HYSTERECTOMY    . CHOLECYSTECTOMY    . COLONOSCOPY    . COLONOSCOPY N/A 11/22/2014   Procedure: COLONOSCOPY;  Surgeon: Lollie Sails, MD;  Location: [facility] ENDOSCOPY;  Service: Endoscopy;  Laterality: N/A;  . OOPHORECTOMY      There were no vitals filed for this visit.  Subjective Assessment - 03/07/18 1305    Subjective  Pt reports that she is doing well today except she feels very fatigued. No pain reported upon arrival. No specific questions or concerns. She continues to complain of bilateral knee and hip stiffness.    Pertinent History  Patient is 73 year old female with diagnosis of spinal cord compression at T4/5 s/p laminectomy with decreased mobility and function. PMH significant for hypertension, osteoporosis, arthritis, and BPPV.     Limitations  Lifting;House hold activities;Walking;Standing    How long can you sit comfortably?  30 min    How long can you stand comfortably?  <5 min    How long can you walk comfortably?  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  No/denies          TREATMENT   Therapeutic Exercise: Hooklying SLRx 10 bilateral; Hooklying bridges with 3s holdx 10; Hooklying clams with manual resistancex 10; Hooklying adductor squeeze with manual resistancex 10; Hooklying SLR hip abduction with manual resistance at ankle x 10 bilaterally; Supine manually resisted leg press x 10 bilateral; Sit to stand from low mat table without UE support x 10;   Manual Therapy  STM with "The Stick" roller to bilateral quads, hamstrings, and gastrocs; Single knee to chest 30s hold x 2 bilateral; HS stretch 30s hold x 2 bilateral; Piriformis stretch 30s hold x 2 bilateral; FABER stretch 30s hold x 2 bilateral;   Neuromuscular Re-education  Dix-Hallpike and roll tests are negative bilaterally;   Pt educated throughout session about proper posture and technique with exercises. Improved exercise technique, movement at target joints, use of target muscles after min to mod verbal, visual, tactile cues.   Dix-Hallpike and roll tests are negative bilaterally. She demonstrates decreased hip range of motion with stretching particularly with flexion  and internal rotation. Pt complaining of some intermittent L hip discomfort with IR stretching however at end of stretches she reports considerable improvement in bilateral hip and knee discomfort. Next session is at pool for aquatic therapy. Pt encouraged to continue HEPandfollow-up as scheduled.Pt presents withdeficits in strength, activity tolerance, motor control, and balance and will continue to benefit from skilled therapeutic intervention.                           PT Short Term Goals - 02/20/18 1314      PT SHORT TERM GOAL #1   Title  Patient will be  adherent to HEP at least 3x a week to improve functional strength in BLE and balance in order to decrease risk of falls and maintain safety at home.    Baseline  Walking supported, sit <>stands performed most days per patient reports    Time  3    Period  Weeks    Status  On-going    Target Date  02/27/18      PT SHORT TERM GOAL #2   Title  Patient will complete five times sit to stand test in < 24 seconds (2 MCIDs) indicating increased LE strength and improved balance in order to improve mobility and decrease risk of falls.    Baseline  (10/26/2017) 29 sec; (11/28/2017) 26.8 sec; 12/29/17: 18.6 sec;    Time  3    Period  Weeks    Status  Achieved      PT SHORT TERM GOAL #3   Title  Patient will ambulate with RW, SBA, on level surfaces for >150 feet with no LOB and no sitting breaks in order to demonstrate increased independence and improved mobility.    Baseline  (10/26/2017) <25 feet, (11/28/2017), 375 ft    Time  3    Period  Weeks    Status  Achieved        PT Long Term Goals - 02/20/18 1315      PT LONG TERM GOAL #1   Title  Patient will increase Berg Balance score to 46/56 to demonstrate decreased fall risk during functional activities and maintain safety.    Baseline  (10/26/2017) 23/56; 11/28/2017: 39/56; 12/29/17: 42/56; 02/20/18: 42/56    Time  8    Period  Weeks    Status  On-going    Target Date  04/10/18      PT LONG TERM GOAL #2   Title  Patient will complete five times sit to stand test in < 15 seconds (2 MCIDs) indicating increased LE strength and improved balance in order to improve mobility and decrease risk of falls.    Baseline  (10/26/2017) 29 sec; (11/28/2017) 26.8 sec; 12/29/17: 18.6s; 02/20/18: 15.9s    Time  12    Period  Weeks    Status  On-going    Target Date  04/10/18      PT LONG TERM GOAL #3   Title  Patient will be independent in bending down towards floor and picking up small object (<5 pounds) and then stand back up without loss of balance in order  to return to PLOF and meet demands at work.    Baseline  (10/26/2017) not assessed 2/2 BLT precautions; 11/28/2017: able to pick up a cone w/o UE support, no loss of balance    Time  12    Period  Weeks    Status  Achieved  PT LONG TERM GOAL #4   Title  Patient will reduce timed up and go to <11 seconds to reduce fall risk and demonstrate improved transfer/gait ability.    Baseline  10/26/2017: not assessed 2/2 to fatigue/endurance; 11/02/17: 43 seconds; 11/28/2017: 25.1 seconds (RW); 12/29/17: 20.0s; 02/20/18: 20.5s    Time  12    Period  Weeks    Status  Partially Met    Target Date  04/10/18      PT LONG TERM GOAL #5   Title  Pt will increase 10MWT by at least 0.13 m/s in order to demonstrate clinically significant improvement in community ambulation.     Baseline  12/29/17: self-selected: 18s = 0.56 m/s, fastest: 13.0s = 0.77 m/s; 02/20/18: self-selected: 20.2 = 0.50 m/s, fastest: 13.0s = 0.77 m/s    Time  12    Period  Weeks    Status  On-going    Target Date  04/10/18      Additional Long Term Goals   Additional Long Term Goals  Yes      PT LONG TERM GOAL #6   Title  Pt will increase 6MWT by at least 52m(1659f in order to demonstrate clinically significant improvement in cardiopulmonary endurance and community ambulation     Baseline  02/20/18: 645'    Time  8    Period  Weeks    Status  New    Target Date  04/10/18            Plan - 03/07/18 1309    Clinical Impression Statement  Dix-Hallpike and roll tests are negative bilaterally. She demonstrates decreased hip range of motion with stretching particularly with flexion and internal rotation. Pt complaining of some intermittent L hip discomfort with IR stretching however at end of stretches she reports considerable improvement in bilateral hip and knee discomfort. Next session is at pool for aquatic therapy. Pt encouraged to continue HEPandfollow-up as scheduled.Pt presents withdeficits in strength, activity  tolerance, motor control, and balance and will continue to benefit from skilled therapeutic intervention.    Rehab Potential  Good    PT Frequency  2x / week    PT Duration  12 weeks    PT Treatment/Interventions  Cryotherapy;Moist Heat;Ultrasound;Gait training;Stair training;Functional mobility training;Therapeutic activities;Therapeutic exercise;Balance training;Neuromuscular re-education;Patient/family education;Orthotic Fit/Training;Manual techniques;Scar mobilization;Dry needling;Energy conservation;Electrical Stimulation;Canalith Repostioning;Vestibular    PT Next Visit Plan  BLE strengthening, balance, stretching, Recheck for BPPV as needed    PT Home Exercise Plan  glute sets, LAQ, sit-to-stand with supervision and RW, standing hip aBduction, stretches: sktc, FABER, FADIR, HS    Consulted and Agree with Plan of Care  Patient       Patient will benefit from skilled therapeutic intervention in order to improve the following deficits and impairments:  Abnormal gait, Decreased endurance, Impaired sensation, Decreased activity tolerance, Decreased strength, Pain, Decreased balance, Decreased mobility, Difficulty walking, Improper body mechanics, Decreased range of motion, Decreased coordination, Postural dysfunction  Visit Diagnosis: Difficulty in walking, not elsewhere classified  Muscle weakness (generalized)  Dizziness and giddiness     Problem List Patient Active Problem List   Diagnosis Date Noted  . Allergic state 03/14/2017  . Arthritis 03/14/2017  . Hypertension 03/14/2017  . Osteoporosis, post-menopausal 03/14/2017  . Benign paroxysmal positional vertigo due to bilateral vestibular disorder 06/30/2015   JaPhillips GroutT, DPT, GCS  Amariyana Heacox 03/08/2018, 11:46 AM  [place] [facility] [address]: [phone]  Fax:  [phone]  Name: Jaclyn Hall MRN: [id number] Date of  Birth: 03/12/1945

## 2018-03-09 ENCOUNTER — Other Ambulatory Visit: Payer: Self-pay

## 2018-03-09 ENCOUNTER — Ambulatory Visit: Payer: BLUE CROSS/BLUE SHIELD

## 2018-03-09 DIAGNOSIS — R262 Difficulty in walking, not elsewhere classified: Secondary | ICD-10-CM

## 2018-03-09 DIAGNOSIS — R42 Dizziness and giddiness: Secondary | ICD-10-CM

## 2018-03-09 DIAGNOSIS — M6281 Muscle weakness (generalized): Secondary | ICD-10-CM

## 2018-03-09 NOTE — Therapy (Signed)
[place] MAIN [facility] SERVICES [address] [place], [place], [address] Phone: [phone]   Fax:  [phone]  Physical Therapy Treatment  Patient Details  Name: Jaclyn Hall MRN: [id number] Date of Birth: 1945-05-13 Referring Provider (PT): Merrilee Seashore, NP   Encounter Date: 03/09/2018  PT End of Session - 03/09/18 1623    Visit Number  35    Number of Visits  19    Date for PT Re-Evaluation  04/10/18    Authorization Type  last goals: 02/20/18    PT Start Time  1420    PT Stop Time  1525    PT Time Calculation (min)  65 min    Activity Tolerance  Patient tolerated treatment well    Behavior During Therapy  [facility] for tasks assessed/performed       Past Medical History:  Diagnosis Date  . Arthritis   . Asthma   . Colon polyps   . Hypertension   . Osteoporosis   . Vertigo     Past Surgical History:  Procedure Laterality Date  . ABDOMINAL HYSTERECTOMY    . CHOLECYSTECTOMY    . COLONOSCOPY    . COLONOSCOPY N/A 11/22/2014   Procedure: COLONOSCOPY;  Surgeon: Lollie Sails, MD;  Location: [facility] ENDOSCOPY;  Service: Endoscopy;  Laterality: N/A;  . OOPHORECTOMY      There were no vitals filed for this visit.  Subjective Assessment - 03/09/18 1620    Subjective  Pt denies any pain, but notes BLE stiffness (hips to feet). Pt states she does get occasional pain in back, (vaguely throughout) but not currently    Patient is accompained by:  Family member    Pertinent History  Patient is 73 year old female with diagnosis of spinal cord compression at T4/5 s/p laminectomy with decreased mobility and function. PMH significant for hypertension, osteoporosis, arthritis, and BPPV.       Enters/exits with Min guard/sidestep with 2 hands on rail.  Blue dumbbells and Min guard/assist to transition about pool  Ambulation at rail  side step, 10L  fwd/bkwd, 10L, rail and 1 blue dumbbell  Core with LE strength,   above chest deep at rail, 20x  ea   Hip abd/add   Hip flex/ext   High knee march x 1 min  Squats, just below chest deep, 25x  Bench, cues for abdominal stab and assist to stay grounded at edge of bench  Up and outs, B, 2 x 10 ea  Bike 2 min, sitting back to wall  Active stretching with assist, 4x ea  Hamstrings/gastroc and hip flexor/quad                         PT Education - 03/09/18 1621    Education Details  Properties and benefits of water as it pertains to exercise/activity. Core stabilization, basic LE strength and active stretching     Person(s) Educated  Patient    Methods  Explanation;Demonstration    Comprehension  Verbalized understanding;Verbal cues required;Tactile cues required;Returned demonstration       PT Short Term Goals - 02/20/18 1314      PT SHORT TERM GOAL #1   Title  Patient will be adherent to HEP at least 3x a week to improve functional strength in BLE and balance in order to decrease risk of falls and maintain safety at home.    Baseline  Walking supported, sit <>stands performed most days per patient reports  Time  3    Period  Weeks    Status  On-going    Target Date  02/27/18      PT SHORT TERM GOAL #2   Title  Patient will complete five times sit to stand test in < 24 seconds (2 MCIDs) indicating increased LE strength and improved balance in order to improve mobility and decrease risk of falls.    Baseline  (10/26/2017) 29 sec; (11/28/2017) 26.8 sec; 12/29/17: 18.6 sec;    Time  3    Period  Weeks    Status  Achieved      PT SHORT TERM GOAL #3   Title  Patient will ambulate with RW, SBA, on level surfaces for >150 feet with no LOB and no sitting breaks in order to demonstrate increased independence and improved mobility.    Baseline  (10/26/2017) <25 feet, (11/28/2017), 375 ft    Time  3    Period  Weeks    Status  Achieved        PT Long Term Goals - 02/20/18 1315      PT LONG TERM GOAL #1   Title  Patient will increase Berg Balance score  to 46/56 to demonstrate decreased fall risk during functional activities and maintain safety.    Baseline  (10/26/2017) 23/56; 11/28/2017: 39/56; 12/29/17: 42/56; 02/20/18: 42/56    Time  8    Period  Weeks    Status  On-going    Target Date  04/10/18      PT LONG TERM GOAL #2   Title  Patient will complete five times sit to stand test in < 15 seconds (2 MCIDs) indicating increased LE strength and improved balance in order to improve mobility and decrease risk of falls.    Baseline  (10/26/2017) 29 sec; (11/28/2017) 26.8 sec; 12/29/17: 18.6s; 02/20/18: 15.9s    Time  12    Period  Weeks    Status  On-going    Target Date  04/10/18      PT LONG TERM GOAL #3   Title  Patient will be independent in bending down towards floor and picking up small object (<5 pounds) and then stand back up without loss of balance in order to return to PLOF and meet demands at work.    Baseline  (10/26/2017) not assessed 2/2 BLT precautions; 11/28/2017: able to pick up a cone w/o UE support, no loss of balance    Time  12    Period  Weeks    Status  Achieved      PT LONG TERM GOAL #4   Title  Patient will reduce timed up and go to <11 seconds to reduce fall risk and demonstrate improved transfer/gait ability.    Baseline  10/26/2017: not assessed 2/2 to fatigue/endurance; 11/02/17: 43 seconds; 11/28/2017: 25.1 seconds (RW); 12/29/17: 20.0s; 02/20/18: 20.5s    Time  12    Period  Weeks    Status  Partially Met    Target Date  04/10/18      PT LONG TERM GOAL #5   Title  Pt will increase 10MWT by at least 0.13 m/s in order to demonstrate clinically significant improvement in community ambulation.     Baseline  12/29/17: self-selected: 18s = 0.56 m/s, fastest: 13.0s = 0.77 m/s; 02/20/18: self-selected: 20.2 = 0.50 m/s, fastest: 13.0s = 0.77 m/s    Time  12    Period  Weeks    Status  On-going    Target  Date  04/10/18      Additional Long Term Goals   Additional Long Term Goals  Yes      PT LONG TERM GOAL #6    Title  Pt will increase 6MWT by at least 30m(1646f in order to demonstrate clinically significant improvement in cardiopulmonary endurance and community ambulation     Baseline  02/20/18: 645'    Time  8    Period  Weeks    Status  New    Target Date  04/10/18            Plan - 03/09/18 1624    Clinical Impression Statement  Increased time this session, as pt is fearful of water, to allow for slow movement/transitions with Min A to acclimate and gain confidence. Pt progressively gaining confidence, but does require Min guard to Min A throughout session. Pt notes improved feeing due to weightlessness in water and enjoyed ambulation with improved ease of moving BLEs. Pt with decreased flexibility throughout BLEs especially L. Continue to progress flexibility, LE and core strength and overall confidence in the water    Rehab Potential  Good    PT Frequency  2x / week    PT Duration  12 weeks    PT Treatment/Interventions  Cryotherapy;Moist Heat;Ultrasound;Gait training;Stair training;Functional mobility training;Therapeutic activities;Therapeutic exercise;Balance training;Neuromuscular re-education;Patient/family education;Orthotic Fit/Training;Manual techniques;Scar mobilization;Dry needling;Energy conservation;Electrical Stimulation;Canalith Repostioning;Vestibular    PT Next Visit Plan  BLE strengthening, balance, stretching, Recheck for BPPV as needed    PT Home Exercise Plan  glute sets, LAQ, sit-to-stand with supervision and RW, standing hip aBduction, stretches: sktc, FABER, FADIR, HS    Consulted and Agree with Plan of Care  Patient       Patient will benefit from skilled therapeutic intervention in order to improve the following deficits and impairments:  Abnormal gait, Decreased endurance, Impaired sensation, Decreased activity tolerance, Decreased strength, Pain, Decreased balance, Decreased mobility, Difficulty walking, Improper body mechanics, Decreased range of motion, Decreased  coordination, Postural dysfunction  Visit Diagnosis: Difficulty in walking, not elsewhere classified  Muscle weakness (generalized)  Dizziness and giddiness     Problem List Patient Active Problem List   Diagnosis Date Noted  . Allergic state 03/14/2017  . Arthritis 03/14/2017  . Hypertension 03/14/2017  . Osteoporosis, post-menopausal 03/14/2017  . Benign paroxysmal positional vertigo due to bilateral vestibular disorder 06/30/2015    HeLarae Grooms/20/2020, 4:30 PM  [facility] [facility]ERVICES [address]hone: [phone] Fax:  [phone]: BrDIARRA CEJARN: [id number] of Birth: 8/Mar 06, 1945

## 2018-03-14 ENCOUNTER — Other Ambulatory Visit: Payer: Self-pay

## 2018-03-14 ENCOUNTER — Ambulatory Visit: Payer: BLUE CROSS/BLUE SHIELD

## 2018-03-14 DIAGNOSIS — R42 Dizziness and giddiness: Secondary | ICD-10-CM

## 2018-03-14 DIAGNOSIS — R262 Difficulty in walking, not elsewhere classified: Secondary | ICD-10-CM | POA: Diagnosis not present

## 2018-03-14 DIAGNOSIS — M6281 Muscle weakness (generalized): Secondary | ICD-10-CM

## 2018-03-14 NOTE — Therapy (Signed)
[place] MAIN [facility] SERVICES [address] [place], [place], [address] Phone: [phone]   Fax:  [phone]  Physical Therapy Treatment  Patient Details  Name: INETHA MARET MRN: [id number] Date of Birth: [birth year] Referring Provider (PT): Merrilee Seashore, NP   Encounter Date: 03/14/2018  PT End of Session - 03/14/18 0904    Visit Number  36    Number of Visits  53    Date for PT Re-Evaluation  04/10/18    Authorization Type  last goals: 02/20/18    PT Start Time  0800    PT Stop Time  0855    PT Time Calculation (min)  55 min    Activity Tolerance  Patient tolerated treatment well    Behavior During Therapy  [facility] for tasks assessed/performed       Past Medical History:  Diagnosis Date  . Arthritis   . Asthma   . Colon polyps   . Hypertension   . Osteoporosis   . Vertigo     Past Surgical History:  Procedure Laterality Date  . ABDOMINAL HYSTERECTOMY    . CHOLECYSTECTOMY    . COLONOSCOPY    . COLONOSCOPY N/A 11/22/2014   Procedure: COLONOSCOPY;  Surgeon: Lollie Sails, MD;  Location: [facility] ENDOSCOPY;  Service: Endoscopy;  Laterality: N/A;  . OOPHORECTOMY      There were no vitals filed for this visit.  Subjective Assessment - 03/14/18 0859    Subjective  Pt reports feeling "really good" after initial aquatic session. Pt notes LEs/body felt looser through the next day. Currently pt states she feels very stiff again. Pt states she does some stretching on land, but notes they make her feel very out of breath.     Patient is accompained by:  Family member    Pertinent History  Patient is 73 year old female with diagnosis of spinal cord compression at T4/5 s/p laminectomy with decreased mobility and function. PMH significant for hypertension, osteoporosis, arthritis, and BPPV.       Enters/exits via ramp; 2 hands side stepping   Ambulation several widths by rail with blue dumbbells for warm up/build confidence in water without  rail  Ambulation, blue dumbbells and Min guard (focus posture and rhythmic/improved stride as well as gentle heel strike)  Fwd 4 L   Sidestep 2 L ; assist to the R for form (avoid R trunk rotation)  Core with LE strength, rail  chest deep water; 20x ea B   High knee march   Hip abd/add   Hip flex/ext   Squats  Step   alternating toe taps, 20x ea, 1 UE support   Active stretching; 3x ea with 10-15 sec hold  Hamstrings/gastroc  Hip flexor/quad  ITB/flank  Static hamstring stretch, seated on step and also out of water on bench for education on carryover at home                         PT Education - 03/14/18 0901    Education Details  Continued on properties of water as it pertains to exercise for greater understanding how water assists and resists. Static hamstrings stretch, seated to be used at home. Toe taps at step.     Person(s) Educated  Patient    Methods  Explanation;Demonstration;Tactile cues;Verbal cues    Comprehension  Verbalized understanding;Returned demonstration;Verbal cues required;Tactile cues required       PT Short Term Goals - 02/20/18 1314  PT SHORT TERM GOAL #1   Title  Patient will be adherent to HEP at least 3x a week to improve functional strength in BLE and balance in order to decrease risk of falls and maintain safety at home.    Baseline  Walking supported, sit <>stands performed most days per patient reports    Time  3    Period  Weeks    Status  On-going    Target Date  02/27/18      PT SHORT TERM GOAL #2   Title  Patient will complete five times sit to stand test in < 24 seconds (2 MCIDs) indicating increased LE strength and improved balance in order to improve mobility and decrease risk of falls.    Baseline  (10/26/2017) 29 sec; (11/28/2017) 26.8 sec; 12/29/17: 18.6 sec;    Time  3    Period  Weeks    Status  Achieved      PT SHORT TERM GOAL #3   Title  Patient will ambulate with RW, SBA, on level surfaces for  >150 feet with no LOB and no sitting breaks in order to demonstrate increased independence and improved mobility.    Baseline  (10/26/2017) <25 feet, (11/28/2017), 375 ft    Time  3    Period  Weeks    Status  Achieved        PT Long Term Goals - 02/20/18 1315      PT LONG TERM GOAL #1   Title  Patient will increase Berg Balance score to 46/56 to demonstrate decreased fall risk during functional activities and maintain safety.    Baseline  (10/26/2017) 23/56; 11/28/2017: 39/56; 12/29/17: 42/56; 02/20/18: 42/56    Time  8    Period  Weeks    Status  On-going    Target Date  04/10/18      PT LONG TERM GOAL #2   Title  Patient will complete five times sit to stand test in < 15 seconds (2 MCIDs) indicating increased LE strength and improved balance in order to improve mobility and decrease risk of falls.    Baseline  (10/26/2017) 29 sec; (11/28/2017) 26.8 sec; 12/29/17: 18.6s; 02/20/18: 15.9s    Time  12    Period  Weeks    Status  On-going    Target Date  04/10/18      PT LONG TERM GOAL #3   Title  Patient will be independent in bending down towards floor and picking up small object (<5 pounds) and then stand back up without loss of balance in order to return to PLOF and meet demands at work.    Baseline  (10/26/2017) not assessed 2/2 BLT precautions; 11/28/2017: able to pick up a cone w/o UE support, no loss of balance    Time  12    Period  Weeks    Status  Achieved      PT LONG TERM GOAL #4   Title  Patient will reduce timed up and go to <11 seconds to reduce fall risk and demonstrate improved transfer/gait ability.    Baseline  10/26/2017: not assessed 2/2 to fatigue/endurance; 11/02/17: 43 seconds; 11/28/2017: 25.1 seconds (RW); 12/29/17: 20.0s; 02/20/18: 20.5s    Time  12    Period  Weeks    Status  Partially Met    Target Date  04/10/18      PT LONG TERM GOAL #5   Title  Pt will increase 10MWT by at least 0.13 m/s in order  to demonstrate clinically significant improvement in  community ambulation.     Baseline  12/29/17: self-selected: 18s = 0.56 m/s, fastest: 13.0s = 0.77 m/s; 02/20/18: self-selected: 20.2 = 0.50 m/s, fastest: 13.0s = 0.77 m/s    Time  12    Period  Weeks    Status  On-going    Target Date  04/10/18      Additional Long Term Goals   Additional Long Term Goals  Yes      PT LONG TERM GOAL #6   Title  Pt will increase 6MWT by at least 63m(1654f in order to demonstrate clinically significant improvement in cardiopulmonary endurance and community ambulation     Baseline  02/20/18: 645'    Time  8    Period  Weeks    Status  New    Target Date  04/10/18            Plan - 03/14/18 0905    Clinical Impression Statement  Pt tolerated well and able to progres to ambulating lengths of pool with blue dumbbells and Min guard. RLE weakness noted especially with sidestepping to the R requiring Min A to maintain trunk neutral versus posterior rotation and increased cues for foot forward (hip neutral) versus ER. Weakness also with hip abd/add and flex/ext  exercises. Added ipsilateral ITB with flank active stretching (only for water) as well as static seated hamstring stretches (to also be performed at home) and educated for home use. Continue aquatic PT to improve flexibility/mobility and strength as well as endurance to improve functional mobility.    Rehab Potential  Good    PT Frequency  2x / week    PT Duration  12 weeks    PT Treatment/Interventions  Cryotherapy;Moist Heat;Ultrasound;Gait training;Stair training;Functional mobility training;Therapeutic activities;Therapeutic exercise;Balance training;Neuromuscular re-education;Patient/family education;Orthotic Fit/Training;Manual techniques;Scar mobilization;Dry needling;Energy conservation;Electrical Stimulation;Canalith Repostioning;Vestibular    PT Next Visit Plan  BLE strengthening, balance, stretching, Recheck for BPPV as needed    PT Home Exercise Plan  glute sets, LAQ, sit-to-stand with  supervision and RW, standing hip aBduction, stretches: sktc, FABER, FADIR, HS    Consulted and Agree with Plan of Care  Patient       Patient will benefit from skilled therapeutic intervention in order to improve the following deficits and impairments:  Abnormal gait, Decreased endurance, Impaired sensation, Decreased activity tolerance, Decreased strength, Pain, Decreased balance, Decreased mobility, Difficulty walking, Improper body mechanics, Decreased range of motion, Decreased coordination, Postural dysfunction  Visit Diagnosis: Difficulty in walking, not elsewhere classified  Muscle weakness (generalized)  Dizziness and giddiness     Problem List Patient Active Problem List   Diagnosis Date Noted  . Allergic state 03/14/2017  . Arthritis 03/14/2017  . Hypertension 03/14/2017  . Osteoporosis, post-menopausal 03/14/2017  . Benign paroxysmal positional vertigo due to bilateral vestibular disorder 06/30/2015    HeLarae Grooms/25/2020, 9:15 AM  Co[place] RELeg[facility] ServicesERVICES [address]dM[place]hone: [phone] Fax:  [phone]: BrMIKYLA SCHACHTERRN: [id number] of Birth: 08/24/23/1947

## 2018-03-16 ENCOUNTER — Ambulatory Visit: Payer: BLUE CROSS/BLUE SHIELD

## 2018-03-16 VITALS — BP 153/68 | HR 86

## 2018-03-16 DIAGNOSIS — R262 Difficulty in walking, not elsewhere classified: Secondary | ICD-10-CM | POA: Diagnosis not present

## 2018-03-16 DIAGNOSIS — M6281 Muscle weakness (generalized): Secondary | ICD-10-CM

## 2018-03-16 DIAGNOSIS — R42 Dizziness and giddiness: Secondary | ICD-10-CM

## 2018-03-16 NOTE — Therapy (Signed)
[place] MAIN [facility] SERVICES [address] [place], [place], [address] Phone: [phone]   Fax:  [phone]  Physical Therapy Treatment  Patient Details  Name: Jaclyn Hall MRN: [id number] Date of Birth: [birth year] Referring Provider (PT): Merrilee Seashore, NP   Encounter Date: 03/16/2018  PT End of Session - 03/16/18 1127    Visit Number  37    Number of Visits  48    Date for PT Re-Evaluation  04/10/18    Authorization Type  last goals: 02/20/18    PT Start Time  1120    PT Stop Time  1200    PT Time Calculation (min)  40 min    Activity Tolerance  Patient tolerated treatment well    Behavior During Therapy  [facility] for tasks assessed/performed       Past Medical History:  Diagnosis Date  . Arthritis   . Asthma   . Colon polyps   . Hypertension   . Osteoporosis   . Vertigo     Past Surgical History:  Procedure Laterality Date  . ABDOMINAL HYSTERECTOMY    . CHOLECYSTECTOMY    . COLONOSCOPY    . COLONOSCOPY N/A 11/22/2014   Procedure: COLONOSCOPY;  Surgeon: Lollie Sails, MD;  Location: [facility] ENDOSCOPY;  Service: Endoscopy;  Laterality: N/A;  . OOPHORECTOMY      Vitals:   03/16/18 1123  BP: (!) 153/68  Pulse: 86    Subjective Assessment - 03/16/18 1122    Subjective  Pt continues to complain of stiffness in her knees and hips but not pain. She enjoyed her aquatic therapy sessions especially the improved mobility she experienced in the water. No specific questions or concerns upon arrival.     Patient is accompained by:  Family member    Pertinent History  Patient is 73 year old female with diagnosis of spinal cord compression at T4/5 s/p laminectomy with decreased mobility and function. PMH significant for hypertension, osteoporosis, arthritis, and BPPV.     Currently in Pain?  No/denies          TREATMENT   Therapeutic Exercise: NuStep L2-3 warm-up during history x 5 minutes; Hooklying SLRx 10  bilateral; Hooklying bridges with 3s holdx 10; Hooklying clams with manual resistancex 10; Hooklying adductor squeeze with manual resistancex 10; Sidelying SLR hip abduction x 10bilaterally; Sidelying clams x 10 bilaterally; Supine manually resisted leg press x 10 bilateral; Sit to stand from low mat table without UE support x 10; 5" alternating step taps x 10 each; Standing Airex balance WBOS eyes open x 30s, eyes closed x 30; Standing mini squat with adductor ball between knees to help with pain x 10;   Manual Therapy  STM with "The Stick" roller to bilateral quads, hamstrings, and gastrocs; Single knee to chest 30s hold bilateral; HS stretch 30s hold bilateral; Piriformis stretch 30s hold x 2 bilateral; FABER stretch 30s hold x 2 bilateral;   Pt educated throughout session about proper posture and technique with exercises. Improved exercise technique, movement at target joints, use of target muscles after min to mod verbal, visual, tactile cues.   Brief stretching session performed with patient to decrease the stiffness she feels in her hip and knees. She is able to perform supine and standing exercises as instructed today. Pt encouraged to continue HEPandfollow-up as scheduled. Will update outcome measures at next session.Pt presents withdeficits in strength, activity tolerance, motor control, and balance and will continue to benefit from skilled therapeutic intervention.  PT Short Term Goals - 02/20/18 1314      PT SHORT TERM GOAL #1   Title  Patient will be adherent to HEP at least 3x a week to improve functional strength in BLE and balance in order to decrease risk of falls and maintain safety at home.    Baseline  Walking supported, sit <>stands performed most days per patient reports    Time  3    Period  Weeks    Status  On-going    Target Date  02/27/18      PT SHORT TERM GOAL #2   Title  Patient will complete  five times sit to stand test in < 24 seconds (2 MCIDs) indicating increased LE strength and improved balance in order to improve mobility and decrease risk of falls.    Baseline  (10/26/2017) 29 sec; (11/28/2017) 26.8 sec; 12/29/17: 18.6 sec;    Time  3    Period  Weeks    Status  Achieved      PT SHORT TERM GOAL #3   Title  Patient will ambulate with RW, SBA, on level surfaces for >150 feet with no LOB and no sitting breaks in order to demonstrate increased independence and improved mobility.    Baseline  (10/26/2017) <25 feet, (11/28/2017), 375 ft    Time  3    Period  Weeks    Status  Achieved        PT Long Term Goals - 02/20/18 1315      PT LONG TERM GOAL #1   Title  Patient will increase Berg Balance score to 46/56 to demonstrate decreased fall risk during functional activities and maintain safety.    Baseline  (10/26/2017) 23/56; 11/28/2017: 39/56; 12/29/17: 42/56; 02/20/18: 42/56    Time  8    Period  Weeks    Status  On-going    Target Date  04/10/18      PT LONG TERM GOAL #2   Title  Patient will complete five times sit to stand test in < 15 seconds (2 MCIDs) indicating increased LE strength and improved balance in order to improve mobility and decrease risk of falls.    Baseline  (10/26/2017) 29 sec; (11/28/2017) 26.8 sec; 12/29/17: 18.6s; 02/20/18: 15.9s    Time  12    Period  Weeks    Status  On-going    Target Date  04/10/18      PT LONG TERM GOAL #3   Title  Patient will be independent in bending down towards floor and picking up small object (<5 pounds) and then stand back up without loss of balance in order to return to PLOF and meet demands at work.    Baseline  (10/26/2017) not assessed 2/2 BLT precautions; 11/28/2017: able to pick up a cone w/o UE support, no loss of balance    Time  12    Period  Weeks    Status  Achieved      PT LONG TERM GOAL #4   Title  Patient will reduce timed up and go to <11 seconds to reduce fall risk and demonstrate improved  transfer/gait ability.    Baseline  10/26/2017: not assessed 2/2 to fatigue/endurance; 11/02/17: 43 seconds; 11/28/2017: 25.1 seconds (RW); 12/29/17: 20.0s; 02/20/18: 20.5s    Time  12    Period  Weeks    Status  Partially Met    Target Date  04/10/18      PT LONG TERM GOAL #5  Title  Pt will increase 10MWT by at least 0.13 m/s in order to demonstrate clinically significant improvement in community ambulation.     Baseline  12/29/17: self-selected: 18s = 0.56 m/s, fastest: 13.0s = 0.77 m/s; 02/20/18: self-selected: 20.2 = 0.50 m/s, fastest: 13.0s = 0.77 m/s    Time  12    Period  Weeks    Status  On-going    Target Date  04/10/18      Additional Long Term Goals   Additional Long Term Goals  Yes      PT LONG TERM GOAL #6   Title  Pt will increase 6MWT by at least 22m(1659f in order to demonstrate clinically significant improvement in cardiopulmonary endurance and community ambulation     Baseline  02/20/18: 645'    Time  8    Period  Weeks    Status  New    Target Date  04/10/18            Plan - 03/16/18 1128    Clinical Impression Statement  Brief stretching session performed with patient to decrease the stiffness she feels in her hip and knees. She is able to perform supine and standing exercises as instructed today. Pt encouraged to continue HEPandfollow-up as scheduled. Will update outcome measures at next session.Pt presents withdeficits in strength, activity tolerance, motor control, and balance and will continue to benefit from skilled therapeutic intervention.    Rehab Potential  Good    PT Frequency  2x / week    PT Duration  12 weeks    PT Treatment/Interventions  Cryotherapy;Moist Heat;Ultrasound;Gait training;Stair training;Functional mobility training;Therapeutic activities;Therapeutic exercise;Balance training;Neuromuscular re-education;Patient/family education;Orthotic Fit/Training;Manual techniques;Scar mobilization;Dry needling;Energy conservation;Electrical  Stimulation;Canalith Repostioning;Vestibular    PT Next Visit Plan  Update outcome measures, BLE strengthening, balance, stretching, Recheck for BPPV as needed    PT Home Exercise Plan  glute sets, LAQ, sit-to-stand with supervision and RW, standing hip aBduction, stretches: sktc, FABER, FADIR, HS    Consulted and Agree with Plan of Care  Patient       Patient will benefit from skilled therapeutic intervention in order to improve the following deficits and impairments:  Abnormal gait, Decreased endurance, Impaired sensation, Decreased activity tolerance, Decreased strength, Pain, Decreased balance, Decreased mobility, Difficulty walking, Improper body mechanics, Decreased range of motion, Decreased coordination, Postural dysfunction  Visit Diagnosis: Difficulty in walking, not elsewhere classified  Muscle weakness (generalized)  Dizziness and giddiness     Problem List Patient Active Problem List   Diagnosis Date Noted  . Allergic state 03/14/2017  . Arthritis 03/14/2017  . Hypertension 03/14/2017  . Osteoporosis, post-menopausal 03/14/2017  . Benign paroxysmal positional vertigo due to bilateral vestibular disorder 06/30/2015   Jaclyn Hall, DPT, GCS  Jaclyn Hall 03/16/2018, 5:16 PM  [facility] [address] [place]: [phone] Fax:  [phone]: Jaclyn SIELOFFRN: [id number] of Birth: 08/1945/08/02

## 2018-03-21 ENCOUNTER — Other Ambulatory Visit: Payer: Self-pay

## 2018-03-21 ENCOUNTER — Ambulatory Visit: Payer: BLUE CROSS/BLUE SHIELD | Attending: Nurse Practitioner

## 2018-03-21 DIAGNOSIS — R262 Difficulty in walking, not elsewhere classified: Secondary | ICD-10-CM | POA: Insufficient documentation

## 2018-03-21 DIAGNOSIS — M6281 Muscle weakness (generalized): Secondary | ICD-10-CM | POA: Diagnosis present

## 2018-03-21 DIAGNOSIS — R42 Dizziness and giddiness: Secondary | ICD-10-CM | POA: Insufficient documentation

## 2018-03-21 NOTE — Therapy (Signed)
[place] MAIN [facility] SERVICES [address] [place], [place], [address] Phone: [phone]   Fax:  [phone]  Physical Therapy Treatment  Patient Details  Name: Jaclyn Hall MRN: [id number] Date of Birth: 1945/12/23 Referring Provider (PT): Merrilee Seashore, NP   Encounter Date: 03/21/2018  PT End of Session - 03/21/18 1300    Visit Number  38    Number of Visits  49    Date for PT Re-Evaluation  04/10/18    Authorization Type  last goals: 02/20/18    PT Start Time  0900    PT Stop Time  0945    PT Time Calculation (min)  45 min    Activity Tolerance  Patient tolerated treatment well    Behavior During Therapy  [facility] for tasks assessed/performed       Past Medical History:  Diagnosis Date  . Arthritis   . Asthma   . Colon polyps   . Hypertension   . Osteoporosis   . Vertigo     Past Surgical History:  Procedure Laterality Date  . ABDOMINAL HYSTERECTOMY    . CHOLECYSTECTOMY    . COLONOSCOPY    . COLONOSCOPY N/A 11/22/2014   Procedure: COLONOSCOPY;  Surgeon: Lollie Sails, MD;  Location: [facility] ENDOSCOPY;  Service: Endoscopy;  Laterality: N/A;  . OOPHORECTOMY      There were no vitals filed for this visit.  Subjective Assessment - 03/21/18 1257    Subjective  Pt reports B hips feeling much better; notes no pain like she had been experiencing. Pt continues with B knee and LB stiffness.     Patient is accompained by:  Family member    Pertinent History  Patient is 73 year old female with diagnosis of spinal cord compression at T4/5 s/p laminectomy with decreased mobility and function. PMH significant for hypertension, osteoporosis, arthritis, and BPPV.       Ambulation, blue dumbbells (Min guard/Min A); 2# ankle wts (primarily for grounding)  Fwd 6 L  Side 4 L  Core with LE work, 2# ankle wts, 2 x 10 ea  Hip abd/add  Hip flex/ext  Squats  B hip circles cw/ccw  Stand hip march with low ab lifts  Bench  Bike 5 min  Active  stretching; B 3x ea  hamstrings/gastroc  hip flexor/quad  LB, center, R, center, L                           PT Education - 03/21/18 1305    Education Details  Hip circles cw/ccw       PT Short Term Goals - 02/20/18 1314      PT SHORT TERM GOAL #1   Title  Patient will be adherent to HEP at least 3x a week to improve functional strength in BLE and balance in order to decrease risk of falls and maintain safety at home.    Baseline  Walking supported, sit <>stands performed most days per patient reports    Time  3    Period  Weeks    Status  On-going    Target Date  02/27/18      PT SHORT TERM GOAL #2   Title  Patient will complete five times sit to stand test in < 24 seconds (2 MCIDs) indicating increased LE strength and improved balance in order to improve mobility and decrease risk of falls.    Baseline  (10/26/2017) 29 sec; (11/28/2017)  26.8 sec; 12/29/17: 18.6 sec;    Time  3    Period  Weeks    Status  Achieved      PT SHORT TERM GOAL #3   Title  Patient will ambulate with RW, SBA, on level surfaces for >150 feet with no LOB and no sitting breaks in order to demonstrate increased independence and improved mobility.    Baseline  (10/26/2017) <25 feet, (11/28/2017), 375 ft    Time  3    Period  Weeks    Status  Achieved        PT Long Term Goals - 02/20/18 1315      PT LONG TERM GOAL #1   Title  Patient will increase Berg Balance score to 46/56 to demonstrate decreased fall risk during functional activities and maintain safety.    Baseline  (10/26/2017) 23/56; 11/28/2017: 39/56; 12/29/17: 42/56; 02/20/18: 42/56    Time  8    Period  Weeks    Status  On-going    Target Date  04/10/18      PT LONG TERM GOAL #2   Title  Patient will complete five times sit to stand test in < 15 seconds (2 MCIDs) indicating increased LE strength and improved balance in order to improve mobility and decrease risk of falls.    Baseline  (10/26/2017) 29 sec; (11/28/2017)  26.8 sec; 12/29/17: 18.6s; 02/20/18: 15.9s    Time  12    Period  Weeks    Status  On-going    Target Date  04/10/18      PT LONG TERM GOAL #3   Title  Patient will be independent in bending down towards floor and picking up small object (<5 pounds) and then stand back up without loss of balance in order to return to PLOF and meet demands at work.    Baseline  (10/26/2017) not assessed 2/2 BLT precautions; 11/28/2017: able to pick up a cone w/o UE support, no loss of balance    Time  12    Period  Weeks    Status  Achieved      PT LONG TERM GOAL #4   Title  Patient will reduce timed up and go to <11 seconds to reduce fall risk and demonstrate improved transfer/gait ability.    Baseline  10/26/2017: not assessed 2/2 to fatigue/endurance; 11/02/17: 43 seconds; 11/28/2017: 25.1 seconds (RW); 12/29/17: 20.0s; 02/20/18: 20.5s    Time  12    Period  Weeks    Status  Partially Met    Target Date  04/10/18      PT LONG TERM GOAL #5   Title  Pt will increase 10MWT by at least 0.13 m/s in order to demonstrate clinically significant improvement in community ambulation.     Baseline  12/29/17: self-selected: 18s = 0.56 m/s, fastest: 13.0s = 0.77 m/s; 02/20/18: self-selected: 20.2 = 0.50 m/s, fastest: 13.0s = 0.77 m/s    Time  12    Period  Weeks    Status  On-going    Target Date  04/10/18      Additional Long Term Goals   Additional Long Term Goals  Yes      PT LONG TERM GOAL #6   Title  Pt will increase 6MWT by at least 26m(1665f in order to demonstrate clinically significant improvement in cardiopulmonary endurance and community ambulation     Baseline  02/20/18: 645'    Time  8    Period  Weeks  Status  New    Target Date  04/10/18            Plan - 03/21/18 1300    Clinical Impression Statement  Pt with difficulting grounding/maintaining balance initially with ambulation (seemingly due to lack of control with bouyant leg); added 2# ankle wts with improved ambulation quality  (stride, speed). RLE demonstrates weakness with R side step with tactile cues/assist to maintain trunk forward positioning. Added hip circles with 2# ankle wts cw/ccw with good tolerance. Increased bicycle time for knee range of motion/decrease stiffness. Contine to progress strength, flexibility and balance to improve all mobility on land. Continue to cue core stabilization throughout session for improved learning.     Rehab Potential  Good    PT Frequency  2x / week    PT Duration  12 weeks    PT Treatment/Interventions  Cryotherapy;Moist Heat;Ultrasound;Gait training;Stair training;Functional mobility training;Therapeutic activities;Therapeutic exercise;Balance training;Neuromuscular re-education;Patient/family education;Orthotic Fit/Training;Manual techniques;Scar mobilization;Dry needling;Energy conservation;Electrical Stimulation;Canalith Repostioning;Vestibular    PT Next Visit Plan  Update outcome measures, BLE strengthening, balance, stretching, Recheck for BPPV as needed    PT Home Exercise Plan  glute sets, LAQ, sit-to-stand with supervision and RW, standing hip aBduction, stretches: sktc, FABER, FADIR, HS    Consulted and Agree with Plan of Care  Patient       Patient will benefit from skilled therapeutic intervention in order to improve the following deficits and impairments:  Abnormal gait, Decreased endurance, Impaired sensation, Decreased activity tolerance, Decreased strength, Pain, Decreased balance, Decreased mobility, Difficulty walking, Improper body mechanics, Decreased range of motion, Decreased coordination, Postural dysfunction  Visit Diagnosis: Difficulty in walking, not elsewhere classified  Muscle weakness (generalized)  Dizziness and giddiness     Problem List Patient Active Problem List   Diagnosis Date Noted  . Allergic state 03/14/2017  . Arthritis 03/14/2017  . Hypertension 03/14/2017  . Osteoporosis, post-menopausal 03/14/2017  . Benign paroxysmal  positional vertigo due to bilateral vestibular disorder 06/30/2015    Larae Grooms 03/21/2018, 1:07 PM  [place] MAIN [facility] SERVICES [address] [place], [place], [address] Phone: [phone]   Fax:  [phone]  Name: KYELLE URBAS MRN: [id number] Date of Birth: May 29, 1945

## 2018-03-23 ENCOUNTER — Ambulatory Visit: Payer: BLUE CROSS/BLUE SHIELD

## 2018-03-23 DIAGNOSIS — R262 Difficulty in walking, not elsewhere classified: Secondary | ICD-10-CM | POA: Diagnosis not present

## 2018-03-23 DIAGNOSIS — M6281 Muscle weakness (generalized): Secondary | ICD-10-CM

## 2018-03-23 DIAGNOSIS — R42 Dizziness and giddiness: Secondary | ICD-10-CM

## 2018-03-23 NOTE — Therapy (Signed)
[place] MAIN [facility] SERVICES [address] [place], [place], [address] Phone: [phone]   Fax:  [phone]  Physical Therapy Treatment  Patient Details  Name: Jaclyn Hall MRN: [id number] Date of Birth: 1945-10-19 Referring Provider (PT): Merrilee Seashore, NP   Encounter Date: 03/23/2018  PT End of Session - 03/23/18 1221    Visit Number  39    Number of Visits  64    Date for PT Re-Evaluation  04/10/18    Authorization Type  last goals: 03/23/18    PT Start Time  1116    PT Stop Time  1156    PT Time Calculation (min)  40 min    Equipment Utilized During Treatment  Gait belt    Activity Tolerance  Patient tolerated treatment well    Behavior During Therapy  [facility] for tasks assessed/performed       Past Medical History:  Diagnosis Date  . Arthritis   . Asthma   . Colon polyps   . Hypertension   . Osteoporosis   . Vertigo     Past Surgical History:  Procedure Laterality Date  . ABDOMINAL HYSTERECTOMY    . CHOLECYSTECTOMY    . COLONOSCOPY    . COLONOSCOPY N/A 11/22/2014   Procedure: COLONOSCOPY;  Surgeon: Lollie Sails, MD;  Location: [facility] ENDOSCOPY;  Service: Endoscopy;  Laterality: N/A;  . OOPHORECTOMY      There were no vitals filed for this visit.      [facility] PT Assessment - 03/23/18 0001      Ambulation/Gait   Gait Comments  10MWT: 15.09sec    4WW     Standardized Balance Assessment   Standardized Balance Assessment  Five Times Sit to Stand;Berg Balance Test    Five times sit to stand comments   16.48 sec      Berg Balance Test   Sit to Stand  Able to stand without using hands and stabilize independently    Standing Unsupported  Able to stand safely 2 minutes    Sitting with Back Unsupported but Feet Supported on Floor or Stool  Able to sit safely and securely 2 minutes    Stand to Sit  Sits safely with minimal use of hands    Transfers  Able to transfer safely, minor use of hands    Standing Unsupported with  Eyes Closed  Able to stand 10 seconds with supervision   LOB after 10 sec fwd, required modA for recovery   Standing Unsupported with Feet Together  Able to place feet together independently and stand for 1 minute with supervision    From Standing, Reach Forward with Outstretched Arm  Can reach confidently >25 cm (10")   c LOB   From Standing Position, Pick up Object from Floor  Able to pick up shoe, needs supervision    From Standing Position, Turn to Look Behind Over each Shoulder  Needs supervision when turning   LOB to right   Turn 360 Degrees  Needs close supervision or verbal cueing   LOB halfway through turn to left   Standing Unsupported, Alternately Place Feet on Step/Stool  Needs assistance to keep from falling or unable to try   LOB with 2nd step, limited by Left knee pain   Standing Unsupported, One Foot in Front  Loses balance while stepping or standing    Standing on One Leg  Unable to try or needs assist to prevent fall    Total Score  35    Berg comment:  High Falls Risk          TREATMENT  Manual Therapy  Single knee to chest 2x30s hold bilateral; HS stretch 2x30s hold bilateral; FABER stretch 30s hold x 2 bilateral; Sumo squat high adductor stretch (supine) 2x30sec   Neuromuscular Re-education:  5xSTS: 16.48 sec, standard chair, hands free except for final rise  10MWT c 4WW: 15.09sec or 0.79m/s TUG: 14.5sec  Berg Balance Test: 35/56    PT Short Term Goals - 02/20/18 1314      PT SHORT TERM GOAL #1   Title  Patient will be adherent to HEP at least 3x a week to improve functional strength in BLE and balance in order to decrease risk of falls and maintain safety at home.    Baseline  Walking supported, sit <>stands performed most days per patient reports    Time  3    Period  Weeks    Status  On-going    Target Date  02/27/18      PT SHORT TERM GOAL #2   Title  Patient will complete five times sit to stand test in < 24 seconds (2 MCIDs) indicating  increased LE strength and improved balance in order to improve mobility and decrease risk of falls.    Baseline  (10/26/2017) 29 sec; (11/28/2017) 26.8 sec; 12/29/17: 18.6 sec;    Time  3    Period  Weeks    Status  Achieved      PT SHORT TERM GOAL #3   Title  Patient will ambulate with RW, SBA, on level surfaces for >150 feet with no LOB and no sitting breaks in order to demonstrate increased independence and improved mobility.    Baseline  (10/26/2017) <25 feet, (11/28/2017), 375 ft    Time  3    Period  Weeks    Status  Achieved        PT Long Term Goals - 03/23/18 1210      PT LONG TERM GOAL #1   Title  Patient will increase Berg Balance score to 46/56 to demonstrate decreased fall risk during functional activities and maintain safety.    Baseline  (10/26/2017) 23/56; 11/28/2017: 39/56; 12/29/17: 42/56; 02/20/18: 42/56; 03/23/18: 35/56     Time  8    Period  Weeks    Status  On-going      PT LONG TERM GOAL #2   Title  Patient will complete five times sit to stand test in < 15 seconds (2 MCIDs) indicating increased LE strength and improved balance in order to improve mobility and decrease risk of falls.    Baseline  (10/26/2017) 29 sec; (11/28/2017) 26.8 sec; 12/29/17: 18.6s; 02/20/18: 15.9s; 03/23/18: 16.48sec    Time  12    Period  Weeks    Status  On-going    Target Date  04/10/18      PT LONG TERM GOAL #3   Title  Patient will be independent in bending down towards floor and picking up small object (<5 pounds) and then stand back up without loss of balance in order to return to PLOF and meet demands at work.    Baseline  (10/26/2017) not assessed 2/2 BLT precautions; 11/28/2017: able to pick up a cone w/o UE support, no loss of balance    Time  12    Period  Weeks    Status  Achieved    Target Date  01/19/18      PT  LONG TERM GOAL #4   Title  Patient will reduce timed up and go to <11 seconds to reduce fall risk and demonstrate improved transfer/gait ability.    Baseline   10/26/2017: not assessed 2/2 to fatigue/endurance; 11/02/17: 43 seconds; 11/28/2017: 25.1 seconds (RW); 12/29/17: 20.0s; 02/20/18: 20.5s; 03/23/18: 14.5sec    Time  12    Period  Weeks    Status  On-going    Target Date  04/10/18      PT LONG TERM GOAL #5   Title  Pt will increase 10MWT by at least 0.13 m/s in order to demonstrate clinically significant improvement in community ambulation.     Baseline  12/29/17: self-selected: 18s = 0.56 m/s, fastest: 13.0s = 0.77 m/s; 02/20/18: self-selected: 20.2 = 0.50 m/s, fastest: 13.0s = 0.77 m/s; 03/23/18: 0.42m/s    Time  12    Period  Weeks    Status  On-going    Target Date  04/10/18      PT LONG TERM GOAL #6   Title  Pt will increase 6MWT by at least 70m (143ft) in order to demonstrate clinically significant improvement in cardiopulmonary endurance and community ambulation     Baseline  02/20/18: 645'    Time  8    Period  Weeks    Status  On-going    Target Date  04/10/18            Plan - 03/23/18 1222    Clinical Impression Statement  Resumed stretching program this dateto address ongoign tightness in BLE.Tightness improves rapidly except for Right posteiror hip areas, which is more resistance to stretching. Reassessed goals and objective measures this date. Pt demonstrates a general but slight decline in most areas, several LOB during Berg Testing, but a TUG tiem slightly better than previous times. Pt conitnues to report improved mobility overal in daily life, happy with progress she has made with aquatic therapy. Pt will continue to benefit from skilled PT intervention to reduce falls risk, improve activity tolerance and improv eindependence with mobility in the community.     Rehab Potential  Good    PT Frequency  2x / week    PT Duration  12 weeks    PT Treatment/Interventions  Cryotherapy;Moist Heat;Ultrasound;Gait training;Stair training;Functional mobility training;Therapeutic activities;Therapeutic exercise;Balance  training;Neuromuscular re-education;Patient/family education;Orthotic Fit/Training;Manual techniques;Scar mobilization;Dry needling;Energy conservation;Electrical Stimulation;Canalith Repostioning;Vestibular    PT Next Visit Plan  continue with mobility and strengthening program, more emphasis on balance measures. Retest 6MWT if desired. Recheck for BPPV as needed.    PT Home Exercise Plan  glute sets, LAQ, sit-to-stand with supervision and RW, standing hip aBduction, stretches: sktc, FABER, FADIR, HS    Consulted and Agree with Plan of Care  Patient       Patient will benefit from skilled therapeutic intervention in order to improve the following deficits and impairments:  Abnormal gait, Decreased endurance, Impaired sensation, Decreased activity tolerance, Decreased strength, Pain, Decreased balance, Decreased mobility, Difficulty walking, Improper body mechanics, Decreased range of motion, Decreased coordination, Postural dysfunction  Visit Diagnosis: Difficulty in walking, not elsewhere classified  Muscle weakness (generalized)  Dizziness and giddiness     Problem List Patient Active Problem List   Diagnosis Date Noted  . Allergic state 03/14/2017  . Arthritis 03/14/2017  . Hypertension 03/14/2017  . Osteoporosis, post-menopausal 03/14/2017  . Benign paroxysmal positional vertigo due to bilateral vestibular disorder 06/30/2015   12:36 PM, 03/23/18 Etta Grandchild, PT, DPT Physical Therapist - [facility]  [place] [phone]     Etta Grandchild 03/23/2018, 12:27 PM  [place] MAIN [facility] SERVICES [address] [place], [place], [address] Phone: [phone]   Fax:  [phone]  Name: Jaclyn Hall MRN: [id number] Date of Birth: Feb 18, 1945

## 2018-03-27 ENCOUNTER — Ambulatory Visit: Payer: BLUE CROSS/BLUE SHIELD

## 2018-03-27 VITALS — BP 156/80 | HR 77

## 2018-03-27 DIAGNOSIS — M6281 Muscle weakness (generalized): Secondary | ICD-10-CM

## 2018-03-27 DIAGNOSIS — R262 Difficulty in walking, not elsewhere classified: Secondary | ICD-10-CM | POA: Diagnosis not present

## 2018-03-27 NOTE — Therapy (Signed)
[place] MAIN [facility] SERVICES [address] [place], [place], [address] Phone: [phone]   Fax:  [phone]  Physical Therapy Progress Note   Dates of reporting period  02/22/18   to   03/27/18  Patient Details  Name: Jaclyn Hall MRN: [id number] Date of Birth: 01/03/1946 Referring Provider (PT): Merrilee Seashore, NP   Encounter Date: 03/27/2018  PT End of Session - 03/28/18 1344    Visit Number  40    Number of Visits  86    Date for PT Re-Evaluation  04/10/18    Authorization Type  last goals: 03/23/18    PT Start Time  1357    PT Stop Time  1430    PT Time Calculation (min)  33 min    Equipment Utilized During Treatment  Gait belt    Activity Tolerance  Patient tolerated treatment well    Behavior During Therapy  [facility] for tasks assessed/performed       Past Medical History:  Diagnosis Date  . Arthritis   . Asthma   . Colon polyps   . Hypertension   . Osteoporosis   . Vertigo     Past Surgical History:  Procedure Laterality Date  . ABDOMINAL HYSTERECTOMY    . CHOLECYSTECTOMY    . COLONOSCOPY    . COLONOSCOPY N/A 11/22/2014   Procedure: COLONOSCOPY;  Surgeon: Lollie Sails, MD;  Location: [facility] ENDOSCOPY;  Service: Endoscopy;  Laterality: N/A;  . OOPHORECTOMY      Vitals:   03/27/18 1359  BP: (!) 156/80  Pulse: 77  SpO2: 96%    Subjective Assessment - 03/27/18 1359    Subjective  Pt reports that she feels like she tweaked her L hip after moving in an akward fashion. She complains of 4/10 L hip pain currently. No specific questions or concerns.    Patient is accompained by:  Family member    Pertinent History  Patient is 73 year old female with diagnosis of spinal cord compression at T4/5 s/p laminectomy with decreased mobility and function. PMH significant for hypertension, osteoporosis, arthritis, and BPPV.     Currently in Pain?  Yes    Pain Score  4     Pain Location  Hip    Pain Orientation  Left    Pain  Descriptors / Indicators  Aching    Pain Type  Acute pain    Pain Onset  In the past 7 days            TREATMENT   Therapeutic Exercise: Seated marches with manual resistance 2 x 10; Seated LAQ with manual resistance 2 x 10; Seated HS curls with green tband 2 x 10; Seated heel raises with manual resistance 2 x 10; Sit to stand from low mat table without UE support x 10;   Gait Training Gait trainingwith lofstrand crutches500'in hallway.Pt demonstrates good sequencing with crutches today with only occasional misplaced crutch. orizontal head turns performed as well as multitalking and gait speed changes. She does get fatigued during ambulation, no seated rest break this session during ambulation, just after   Pt educated throughout session about proper posture and technique with exercises. Improved exercise technique, movement at target joints, use of target muscles after min to mod verbal, visual, tactile cues.   Pt arrived late for her appointment so session was abbreviated due to scheduling limitations. Goals were reassessed at last session.. Pt demonstrates a general but slight decline in most areas but a  TUG time slightly better than previous times. Pt conitnues to report improved mobility overal in daily life, happy with progress she has made with aquatic therapy. Pt will continue to benefit from skilled PT intervention to reduce falls risk, improve activity tolerance and improve independence with mobility in the community.                          PT Short Term Goals - 02/20/18 1314      PT SHORT TERM GOAL #1   Title  Patient will be adherent to HEP at least 3x a week to improve functional strength in BLE and balance in order to decrease risk of falls and maintain safety at home.    Baseline  Walking supported, sit <>stands performed most days per patient reports    Time  3    Period  Weeks    Status  On-going    Target Date  02/27/18       PT SHORT TERM GOAL #2   Title  Patient will complete five times sit to stand test in < 24 seconds (2 MCIDs) indicating increased LE strength and improved balance in order to improve mobility and decrease risk of falls.    Baseline  (10/26/2017) 29 sec; (11/28/2017) 26.8 sec; 12/29/17: 18.6 sec;    Time  3    Period  Weeks    Status  Achieved      PT SHORT TERM GOAL #3   Title  Patient will ambulate with RW, SBA, on level surfaces for >150 feet with no LOB and no sitting breaks in order to demonstrate increased independence and improved mobility.    Baseline  (10/26/2017) <25 feet, (11/28/2017), 375 ft    Time  3    Period  Weeks    Status  Achieved        PT Long Term Goals - 03/23/18 1210      PT LONG TERM GOAL #1   Title  Patient will increase Berg Balance score to 46/56 to demonstrate decreased fall risk during functional activities and maintain safety.    Baseline  (10/26/2017) 23/56; 11/28/2017: 39/56; 12/29/17: 42/56; 02/20/18: 42/56; 03/23/18: 35/56     Time  8    Period  Weeks    Status  On-going      PT LONG TERM GOAL #2   Title  Patient will complete five times sit to stand test in < 15 seconds (2 MCIDs) indicating increased LE strength and improved balance in order to improve mobility and decrease risk of falls.    Baseline  (10/26/2017) 29 sec; (11/28/2017) 26.8 sec; 12/29/17: 18.6s; 02/20/18: 15.9s; 03/23/18: 16.48sec    Time  12    Period  Weeks    Status  On-going    Target Date  04/10/18      PT LONG TERM GOAL #3   Title  Patient will be independent in bending down towards floor and picking up small object (<5 pounds) and then stand back up without loss of balance in order to return to PLOF and meet demands at work.    Baseline  (10/26/2017) not assessed 2/2 BLT precautions; 11/28/2017: able to pick up a cone w/o UE support, no loss of balance    Time  12    Period  Weeks    Status  Achieved    Target Date  01/19/18      PT LONG TERM GOAL #4   Title  Patient will reduce  timed up and go to <11 seconds to reduce fall risk and demonstrate improved transfer/gait ability.    Baseline  10/26/2017: not assessed 2/2 to fatigue/endurance; 11/02/17: 43 seconds; 11/28/2017: 25.1 seconds (RW); 12/29/17: 20.0s; 02/20/18: 20.5s; 03/23/18: 14.5sec    Time  12    Period  Weeks    Status  On-going    Target Date  04/10/18      PT LONG TERM GOAL #5   Title  Pt will increase 10MWT by at least 0.13 m/s in order to demonstrate clinically significant improvement in community ambulation.     Baseline  12/29/17: self-selected: 18s = 0.56 m/s, fastest: 13.0s = 0.77 m/s; 02/20/18: self-selected: 20.2 = 0.50 m/s, fastest: 13.0s = 0.77 m/s; 03/23/18: 0.67m/s    Time  12    Period  Weeks    Status  On-going    Target Date  04/10/18      PT LONG TERM GOAL #6   Title  Pt will increase 6MWT by at least 66m (137ft) in order to demonstrate clinically significant improvement in cardiopulmonary endurance and community ambulation     Baseline  02/20/18: 645'    Time  8    Period  Weeks    Status  On-going    Target Date  04/10/18            Plan - 03/28/18 1345    Clinical Impression Statement  Pt arrived late for her appointment so session was abbreviated due to scheduling limitations. Goals were reassessed at last session.. Pt demonstrates a general but slight decline in most areas but a TUG time slightly better than previous times. Pt conitnues to report improved mobility overal in daily life, happy with progress she has made with aquatic therapy. Pt will continue to benefit from skilled PT intervention to reduce falls risk, improve activity tolerance and improve independence with mobility in the community.       Rehab Potential  Good    PT Frequency  2x / week    PT Duration  12 weeks    PT Treatment/Interventions  Cryotherapy;Moist Heat;Ultrasound;Gait training;Stair training;Functional mobility training;Therapeutic activities;Therapeutic exercise;Balance training;Neuromuscular  re-education;Patient/family education;Orthotic Fit/Training;Manual techniques;Scar mobilization;Dry needling;Energy conservation;Electrical Stimulation;Canalith Repostioning;Vestibular    PT Next Visit Plan  Retest 6MWT, continue with mobility and strengthening program, more emphasis on balance measures. Recheck for BPPV as needed.    PT Home Exercise Plan  glute sets, LAQ, sit-to-stand with supervision and RW, standing hip aBduction, stretches: sktc, FABER, FADIR, HS    Consulted and Agree with Plan of Care  Patient       Patient will benefit from skilled therapeutic intervention in order to improve the following deficits and impairments:  Abnormal gait, Decreased endurance, Impaired sensation, Decreased activity tolerance, Decreased strength, Pain, Decreased balance, Decreased mobility, Difficulty walking, Improper body mechanics, Decreased range of motion, Decreased coordination, Postural dysfunction  Visit Diagnosis: Difficulty in walking, not elsewhere classified  Muscle weakness (generalized)     Problem List Patient Active Problem List   Diagnosis Date Noted  . Allergic state 03/14/2017  . Arthritis 03/14/2017  . Hypertension 03/14/2017  . Osteoporosis, post-menopausal 03/14/2017  . Benign paroxysmal positional vertigo due to bilateral vestibular disorder 06/30/2015   Phillips Grout PT, DPT, GCS  Violanda Bobeck 03/28/2018, 2:01 PM  [place] MAIN [facility] SERVICES [address] [place], [place], [address] Phone: [phone]   Fax:  [phone]  Name: JEREMY MCLAMB MRN: [id number] Date of  Birth: 1945/10/25

## 2018-03-29 ENCOUNTER — Ambulatory Visit: Payer: BLUE CROSS/BLUE SHIELD

## 2018-04-04 ENCOUNTER — Ambulatory Visit: Payer: BLUE CROSS/BLUE SHIELD

## 2018-04-04 ENCOUNTER — Other Ambulatory Visit: Payer: Self-pay

## 2018-04-04 DIAGNOSIS — R262 Difficulty in walking, not elsewhere classified: Secondary | ICD-10-CM

## 2018-04-04 DIAGNOSIS — M6281 Muscle weakness (generalized): Secondary | ICD-10-CM

## 2018-04-04 DIAGNOSIS — R42 Dizziness and giddiness: Secondary | ICD-10-CM

## 2018-04-04 NOTE — Therapy (Signed)
[place] MAIN [facility] SERVICES [address] [place], [place], [address] Phone: [phone]   Fax:  [phone]  Physical Therapy Treatment  Patient Details  Name: Jaclyn Hall MRN: [id number] Date of Birth: 24-Nov-1945 Referring Provider (PT): Merrilee Seashore, NP   Encounter Date: 04/04/2018  PT End of Session - 04/04/18 1410    Visit Number  41    Number of Visits  21    Date for PT Re-Evaluation  04/10/18    Authorization Type  last goals: 03/23/18    PT Start Time  1355    PT Stop Time  1430    PT Time Calculation (min)  35 min    Equipment Utilized During Treatment  Gait belt    Activity Tolerance  Patient tolerated treatment well    Behavior During Therapy  [facility] for tasks assessed/performed       Past Medical History:  Diagnosis Date  . Arthritis   . Asthma   . Colon polyps   . Hypertension   . Osteoporosis   . Vertigo     Past Surgical History:  Procedure Laterality Date  . ABDOMINAL HYSTERECTOMY    . CHOLECYSTECTOMY    . COLONOSCOPY    . COLONOSCOPY N/A 11/22/2014   Procedure: COLONOSCOPY;  Surgeon: Lollie Sails, MD;  Location: [facility] ENDOSCOPY;  Service: Endoscopy;  Laterality: N/A;  . OOPHORECTOMY      There were no vitals filed for this visit.  Subjective Assessment - 04/04/18 1408    Subjective  Pt reports that is having bilateral knee pain. She saw her PCP for a physical and he ordered plain film radiographs of both knees. She denies pain at rest but reports knee pain in standing. She states that the pain gets up to 8/10. No specific questions or concerns.    Patient is accompained by:  Family member    Pertinent History  Patient is 73 year old female with diagnosis of spinal cord compression at T4/5 s/p laminectomy with decreased mobility and function. PMH significant for hypertension, osteoporosis, arthritis, and BPPV.     Currently in Pain?  No/denies   Only pain in standing   Pain Onset  --          TREATMENT   Therapeutic Exercise: Hooklying marches alternating LE x 15 each; Hooklying clams with manual resistance x 15; Hooklying adductor squeeze x 15;   Canalith Repositioning Maneuver Tested horizontal and posterior canal with roll test and Dix-Hallpike respectively due to complaints of some persistent vertigo. Negative Dix-Hallpike testing. Pt has R pure horizontal geotrophic nystagmus in R roll test position with concurrent vertigo. Unable to observe during the L roll test. Pt treated for possible R horizontal canal canalithiasis with Lempert roll. One minute holds in each position. Roll testing after treatment is negative bilaterally.    Pt educated throughout session about proper posture and technique with exercises. Improved exercise technique, movement at target joints, use of target muscles after min to mod verbal, visual, tactile cues.   Treated pt today for possible R horizontal canalithiasis with resolution during retesting. She reports increased knee pain in weightbearing so focused on supine strengthening. She arrived late for session so time was limited.  Pt encouraged to continue HEPandfollow-up as scheduled.Pt presents withdeficits in strength, activity tolerance, motor control, and balance and will continue to benefit from skilled therapeutic intervention.                   PT  Short Term Goals - 02/20/18 1314      PT SHORT TERM GOAL #1   Title  Patient will be adherent to HEP at least 3x a week to improve functional strength in BLE and balance in order to decrease risk of falls and maintain safety at home.    Baseline  Walking supported, sit <>stands performed most days per patient reports    Time  3    Period  Weeks    Status  On-going    Target Date  02/27/18      PT SHORT TERM GOAL #2   Title  Patient will complete five times sit to stand test in < 24 seconds (2 MCIDs) indicating increased LE strength and improved balance in  order to improve mobility and decrease risk of falls.    Baseline  (10/26/2017) 29 sec; (11/28/2017) 26.8 sec; 12/29/17: 18.6 sec;    Time  3    Period  Weeks    Status  Achieved      PT SHORT TERM GOAL #3   Title  Patient will ambulate with RW, SBA, on level surfaces for >150 feet with no LOB and no sitting breaks in order to demonstrate increased independence and improved mobility.    Baseline  (10/26/2017) <25 feet, (11/28/2017), 375 ft    Time  3    Period  Weeks    Status  Achieved        PT Long Term Goals - 03/23/18 1210      PT LONG TERM GOAL #1   Title  Patient will increase Berg Balance score to 46/56 to demonstrate decreased fall risk during functional activities and maintain safety.    Baseline  (10/26/2017) 23/56; 11/28/2017: 39/56; 12/29/17: 42/56; 02/20/18: 42/56; 03/23/18: 35/56     Time  8    Period  Weeks    Status  On-going      PT LONG TERM GOAL #2   Title  Patient will complete five times sit to stand test in < 15 seconds (2 MCIDs) indicating increased LE strength and improved balance in order to improve mobility and decrease risk of falls.    Baseline  (10/26/2017) 29 sec; (11/28/2017) 26.8 sec; 12/29/17: 18.6s; 02/20/18: 15.9s; 03/23/18: 16.48sec    Time  12    Period  Weeks    Status  On-going    Target Date  04/10/18      PT LONG TERM GOAL #3   Title  Patient will be independent in bending down towards floor and picking up small object (<5 pounds) and then stand back up without loss of balance in order to return to PLOF and meet demands at work.    Baseline  (10/26/2017) not assessed 2/2 BLT precautions; 11/28/2017: able to pick up a cone w/o UE support, no loss of balance    Time  12    Period  Weeks    Status  Achieved    Target Date  01/19/18      PT LONG TERM GOAL #4   Title  Patient will reduce timed up and go to <11 seconds to reduce fall risk and demonstrate improved transfer/gait ability.    Baseline  10/26/2017: not assessed 2/2 to fatigue/endurance;  11/02/17: 43 seconds; 11/28/2017: 25.1 seconds (RW); 12/29/17: 20.0s; 02/20/18: 20.5s; 03/23/18: 14.5sec    Time  12    Period  Weeks    Status  On-going    Target Date  04/10/18      PT LONG  TERM GOAL #5   Title  Pt will increase 10MWT by at least 0.13 m/s in order to demonstrate clinically significant improvement in community ambulation.     Baseline  12/29/17: self-selected: 18s = 0.56 m/s, fastest: 13.0s = 0.77 m/s; 02/20/18: self-selected: 20.2 = 0.50 m/s, fastest: 13.0s = 0.77 m/s; 03/23/18: 0.74m/s    Time  12    Period  Weeks    Status  On-going    Target Date  04/10/18      PT LONG TERM GOAL #6   Title  Pt will increase 6MWT by at least 83m (192ft) in order to demonstrate clinically significant improvement in cardiopulmonary endurance and community ambulation     Baseline  02/20/18: 645'    Time  8    Period  Weeks    Status  On-going    Target Date  04/10/18            Plan - 04/04/18 1411    Clinical Impression Statement  Treated pt today for possible R horizontal canalithiasis with resolution during retesting. She reports increased knee pain in weightbearing so focused on supine strengthening. She arrived late for session so time was limited.  Pt encouraged to continue HEPandfollow-up as scheduled.Pt presents withdeficits in strength, activity tolerance, motor control, and balance and will continue to benefit from skilled therapeutic intervention.    Rehab Potential  Good    PT Frequency  2x / week    PT Duration  12 weeks    PT Treatment/Interventions  Cryotherapy;Moist Heat;Ultrasound;Gait training;Stair training;Functional mobility training;Therapeutic activities;Therapeutic exercise;Balance training;Neuromuscular re-education;Patient/family education;Orthotic Fit/Training;Manual techniques;Scar mobilization;Dry needling;Energy conservation;Electrical Stimulation;Canalith Repostioning;Vestibular    PT Next Visit Plan  Continue with mobility and strengthening program, more  emphasis on balance measures. Recheck for BPPV as needed.    PT Home Exercise Plan  glute sets, LAQ, sit-to-stand with supervision and RW, standing hip aBduction, stretches: sktc, FABER, FADIR, HS    Consulted and Agree with Plan of Care  Patient       Patient will benefit from skilled therapeutic intervention in order to improve the following deficits and impairments:  Abnormal gait, Decreased endurance, Impaired sensation, Decreased activity tolerance, Decreased strength, Pain, Decreased balance, Decreased mobility, Difficulty walking, Improper body mechanics, Decreased range of motion, Decreased coordination, Postural dysfunction  Visit Diagnosis: Difficulty in walking, not elsewhere classified  Muscle weakness (generalized)  Dizziness and giddiness     Problem List Patient Active Problem List   Diagnosis Date Noted  . Allergic state 03/14/2017  . Arthritis 03/14/2017  . Hypertension 03/14/2017  . Osteoporosis, post-menopausal 03/14/2017  . Benign paroxysmal positional vertigo due to bilateral vestibular disorder 06/30/2015   Phillips Grout PT, DPT, GCS  Verta Riedlinger 04/05/2018, 2:00 PM  [place] MAIN [facility] SERVICES [address] [place], [place], [address] Phone: [phone]   Fax:  [phone]  Name: Jaclyn Hall MRN: [id number] Date of Birth: 1945-09-21

## 2018-04-06 ENCOUNTER — Ambulatory Visit: Payer: BLUE CROSS/BLUE SHIELD

## 2018-04-06 ENCOUNTER — Other Ambulatory Visit: Payer: Self-pay

## 2018-04-06 DIAGNOSIS — R262 Difficulty in walking, not elsewhere classified: Secondary | ICD-10-CM

## 2018-04-06 DIAGNOSIS — M6281 Muscle weakness (generalized): Secondary | ICD-10-CM

## 2018-04-06 DIAGNOSIS — R42 Dizziness and giddiness: Secondary | ICD-10-CM

## 2018-04-06 NOTE — Therapy (Signed)
[place] MAIN [facility] SERVICES [address] [place], [place], [address] Phone: [phone]   Fax:  [phone]  Physical Therapy Treatment/Recertification  Patient Details  Name: Jaclyn Hall  MRN: [id number] Date of Birth: 06/05/1945 Referring Provider (PT): Merrilee Seashore, NP   Encounter Date: 04/06/2018  PT End of Session - 04/10/18 1540    Visit Number  42    Number of Visits  69    Date for PT Re-Evaluation  06/29/18    Authorization Type  last goals: 03/23/18    PT Start Time  1118    PT Stop Time  1200    PT Time Calculation (min)  42 min    Equipment Utilized During Treatment  Gait belt    Activity Tolerance  Patient tolerated treatment well    Behavior During Therapy  [facility] for tasks assessed/performed       Past Medical History:  Diagnosis Date  . Arthritis   . Asthma   . Colon polyps   . Hypertension   . Osteoporosis   . Vertigo     Past Surgical History:  Procedure Laterality Date  . ABDOMINAL HYSTERECTOMY    . CHOLECYSTECTOMY    . COLONOSCOPY    . COLONOSCOPY N/A 11/22/2014   Procedure: COLONOSCOPY;  Surgeon: Lollie Sails, MD;  Location: [facility] ENDOSCOPY;  Service: Endoscopy;  Laterality: N/A;  . OOPHORECTOMY      There were no vitals filed for this visit.  Subjective Assessment - 04/10/18 1247    Subjective  Pt reports that she continues to having bilateral knee pain.  She saw the orthopedist yesterday who said that she has significant arthritis in both knees, worse in the L knee. She is going to start with conservative management including newly prescribed Voltaren gel. No specific questions or concerns upon arrival today.    Patient is accompained by:  Family member    Pertinent History  Patient is 73 year old female with diagnosis of spinal cord compression at T4/5 s/p laminectomy with decreased mobility and function. PMH significant for hypertension, osteoporosis, arthritis, and BPPV.     Currently in Pain?   Yes    Pain Score  2     Pain Location  Knee    Pain Orientation  Right;Left    Pain Descriptors / Indicators  Aching    Pain Type  Acute pain          TREATMENT   Therapeutic Exercise: Seated marches alternating LE x 15 each; Seated clams with manual resistance x 15; Seated adductor squeeze x 15; Standing marches with 2.5# ankle weights (AW) x 10 bilateral; Standing HS curls with 2.5# AW x 10 bilateral; Standing hip abduction with 2.5# AW x 10 bilateral; Standing hip extension with 2.5# AW x 10 bilateral; Sidestepping in // bars with 2.5# AW  Tested horizontal and posterior canal with roll test and Dix-Hallpike respectively which is negative bilaterally; Hooklying SLR x 10 bilateral; Hooklying bridges x 10; Supine pball crunch 3s hold 2 x 10;   Neuromuscular Re-education  Tested horizontal and posterior canals with roll test and Dix-Hallpike test respectively and they are negative bilaterally. Education provided about what to do if symptoms recur.    Pt educated throughout session about proper posture and technique with exercises. Improved exercise technique, movement at target joints, use of target muscles after min to mod verbal, visual, tactile cues.   Pt demonstrates a general but slight decline in most areas but a  TUG time slightly better than previous times when goals were last updated. Pt conitnues to report improved mobility overal in daily life, happy with progress she has made with aquatic therapy. She has been somewhat limited in her exercise lately during therapy sessions due to bilateral knee pain. Will continue to progress exercise and balance training. Pt will continue to benefit from skilled PT intervention to reduce falls risk, improve activity tolerance and improve independence with mobility in the community.                        PT Short Term Goals - 02/20/18 1314      PT SHORT TERM GOAL #1   Title  Patient will be adherent to  HEP at least 3x a week to improve functional strength in BLE and balance in order to decrease risk of falls and maintain safety at home.    Baseline  Walking supported, sit <>stands performed most days per patient reports    Time  3    Period  Weeks    Status  On-going    Target Date  02/27/18      PT SHORT TERM GOAL #2   Title  Patient will complete five times sit to stand test in < 24 seconds (2 MCIDs) indicating increased LE strength and improved balance in order to improve mobility and decrease risk of falls.    Baseline  (10/26/2017) 29 sec; (11/28/2017) 26.8 sec; 12/29/17: 18.6 sec;    Time  3    Period  Weeks    Status  Achieved      PT SHORT TERM GOAL #3   Title  Patient will ambulate with RW, SBA, on level surfaces for >150 feet with no LOB and no sitting breaks in order to demonstrate increased independence and improved mobility.    Baseline  (10/26/2017) <25 feet, (11/28/2017), 375 ft    Time  3    Period  Weeks    Status  Achieved        PT Long Term Goals - 03/23/18 1210      PT LONG TERM GOAL #1   Title  Patient will increase Berg Balance score to 46/56 to demonstrate decreased fall risk during functional activities and maintain safety.    Baseline  (10/26/2017) 23/56; 11/28/2017: 39/56; 12/29/17: 42/56; 02/20/18: 42/56; 03/23/18: 35/56     Time  8    Period  Weeks    Status  On-going      PT LONG TERM GOAL #2   Title  Patient will complete five times sit to stand test in < 15 seconds (2 MCIDs) indicating increased LE strength and improved balance in order to improve mobility and decrease risk of falls.    Baseline  (10/26/2017) 29 sec; (11/28/2017) 26.8 sec; 12/29/17: 18.6s; 02/20/18: 15.9s; 03/23/18: 16.48sec    Time  12    Period  Weeks    Status  On-going    Target Date  04/10/18      PT LONG TERM GOAL #3   Title  Patient will be independent in bending down towards floor and picking up small object (<5 pounds) and then stand back up without loss of balance in order to  return to PLOF and meet demands at work.    Baseline  (10/26/2017) not assessed 2/2 BLT precautions; 11/28/2017: able to pick up a cone w/o UE support, no loss of balance    Time  12    Period  Weeks    Status  Achieved    Target Date  01/19/18      PT LONG TERM GOAL #4   Title  Patient will reduce timed up and go to <11 seconds to reduce fall risk and demonstrate improved transfer/gait ability.    Baseline  10/26/2017: not assessed 2/2 to fatigue/endurance; 11/02/17: 43 seconds; 11/28/2017: 25.1 seconds (RW); 12/29/17: 20.0s; 02/20/18: 20.5s; 03/23/18: 14.5sec    Time  12    Period  Weeks    Status  On-going    Target Date  04/10/18      PT LONG TERM GOAL #5   Title  Pt will increase 10MWT by at least 0.13 m/s in order to demonstrate clinically significant improvement in community ambulation.     Baseline  12/29/17: self-selected: 18s = 0.56 m/s, fastest: 13.0s = 0.77 m/s; 02/20/18: self-selected: 20.2 = 0.50 m/s, fastest: 13.0s = 0.77 m/s; 03/23/18: 0.31m/s    Time  12    Period  Weeks    Status  On-going    Target Date  04/10/18      PT LONG TERM GOAL #6   Title  Pt will increase 6MWT by at least 50m (150ft) in order to demonstrate clinically significant improvement in cardiopulmonary endurance and community ambulation     Baseline  02/20/18: 645'    Time  8    Period  Weeks    Status  On-going    Target Date  04/10/18            Plan - 04/10/18 1538    Clinical Impression Statement  Pt demonstrates a general but slight decline in most areas but a TUG time slightly better than previous times when goals were last updated. Pt conitnues to report improved mobility overal in daily life, happy with progress she has made with aquatic therapy. She has been somewhat limited in her exercise lately during therapy sessions due to bilateral knee pain. Will continue to progress exercise and balance training. Pt will continue to benefit from skilled PT intervention to reduce falls risk, improve  activity tolerance and improve independence with mobility in the community.     Rehab Potential  Good    PT Frequency  2x / week    PT Duration  12 weeks    PT Treatment/Interventions  Cryotherapy;Moist Heat;Ultrasound;Gait training;Stair training;Functional mobility training;Therapeutic activities;Therapeutic exercise;Balance training;Neuromuscular re-education;Patient/family education;Orthotic Fit/Training;Manual techniques;Scar mobilization;Dry needling;Energy conservation;Electrical Stimulation;Canalith Repostioning;Vestibular    PT Next Visit Plan  Continue with mobility and strengthening program, more emphasis on balance measures. Recheck for BPPV as needed.    PT Home Exercise Plan  glute sets, LAQ, sit-to-stand with supervision and RW, standing hip aBduction, stretches: sktc, FABER, FADIR, HS    Consulted and Agree with Plan of Care  Patient       Patient will benefit from skilled therapeutic intervention in order to improve the following deficits and impairments:  Abnormal gait, Decreased endurance, Impaired sensation, Decreased activity tolerance, Decreased strength, Pain, Decreased balance, Decreased mobility, Difficulty walking, Improper body mechanics, Decreased range of motion, Decreased coordination, Postural dysfunction  Visit Diagnosis: Difficulty in walking, not elsewhere classified  Muscle weakness (generalized)  Dizziness and giddiness     Problem List Patient Active Problem List   Diagnosis Date Noted  . Allergic state 03/14/2017  . Arthritis 03/14/2017  . Hypertension 03/14/2017  . Osteoporosis, post-menopausal 03/14/2017  . Benign paroxysmal positional vertigo due to bilateral vestibular disorder 06/30/2015   Lyndel Safe Ileta Ofarrell PT, DPT, GCS  Yamilet Mcfayden 04/10/2018, 3:45 PM  [place] MAIN [facility] SERVICES [address] [place], [place], [address] Phone: [phone]   Fax:  [phone]  Name: Jaclyn Hall MRN:  [id number] Date of Birth: [birth year]

## 2018-04-11 ENCOUNTER — Ambulatory Visit: Payer: BLUE CROSS/BLUE SHIELD

## 2018-04-11 NOTE — Therapy (Signed)
[place] MAIN [facility] SERVICES [address] [place], [place], [address] Phone: [phone]   Fax:  [phone]  Patient Details  Name: Jaclyn Hall MRN: [id number] Date of Birth: [birth year] Referring Provider:  No ref. provider found  Encounter Date: 04/11/2018   PT spoke with patient over the phone to notify of outpatient physical therapy clinic closure secondary to COVID-19 precautions. Pt does not have any questions at this time. She reports that she is continuing her home program. Therapist requested patient call if needed with any questions or concerns regarding her home physical therapy program while clinic is closed. Patient verbalized understanding.   Phillips Grout PT, DPT, GCS  Kailena Lubas 04/11/2018, 10:58 AM  [place] MAIN [facility] SERVICES [address] [place], [place], [address] Phone: [phone]   Fax:  [phone]

## 2018-04-12 NOTE — Addendum Note (Signed)
Addended by: Roxana Hires D on: 04/12/2018 02:59 PM   Modules accepted: Orders

## 2018-04-12 NOTE — Addendum Note (Signed)
Addended by: Roxana Hires D on: 04/12/2018 03:05 PM   Modules accepted: Orders

## 2018-04-12 NOTE — Addendum Note (Signed)
Addended by: Roxana Hires D on: 04/12/2018 03:08 PM   Modules accepted: Orders

## 2018-04-13 ENCOUNTER — Ambulatory Visit: Payer: BLUE CROSS/BLUE SHIELD

## 2018-04-17 ENCOUNTER — Ambulatory Visit: Payer: BLUE CROSS/BLUE SHIELD

## 2018-04-20 ENCOUNTER — Ambulatory Visit: Payer: Medicare Other | Attending: Family Medicine

## 2018-04-24 ENCOUNTER — Ambulatory Visit: Payer: Medicare Other

## 2018-04-27 ENCOUNTER — Ambulatory Visit: Payer: Medicare Other

## 2018-05-01 ENCOUNTER — Ambulatory Visit: Payer: Medicare Other

## 2018-05-01 NOTE — Therapy (Signed)
[place] MAIN [facility] SERVICES [address] [place], [place], [address] Phone: [phone]   Fax:  [phone]  Patient Details  Name: Jaclyn Hall MRN: [id number] Date of Birth: 16-Nov-1945 Referring Provider:  No ref. provider found  Encounter Date: 05/01/2018  The [facility] outpatient clinics are closed at this time due to the COVID-19 epidemic. The patient was contacted in regards to their therapy services. The patient was sleeping so spoke with husband. Husband reports that pt may be interested in either telehealth or home health therapy. Husband will have pt return call to clinic to discuss further.     Lyndel Safe Densel Kronick PT, DPT, GCS  Saren Corkern 05/01/2018, 1:58 PM  [place] MAIN [facility] SERVICES [address] [place], [place], [address] Phone: [phone]   Fax:  [phone]

## 2018-05-01 NOTE — Therapy (Signed)
[place] MAIN [facility] SERVICES [address] [place], [place], [address] Phone: [phone]   Fax:  [phone]  Patient Details  Name: Jaclyn Hall MRN: [id number] Date of Birth: 01-27-1945 Referring Provider:  No ref. provider found  Encounter Date: 05/01/2018  Pt returned call to therapist and states that she is not interested in telehealth at this time but would be interested in establishing with home health PT until clinic can be re-opened. Given the patient's status and needs, the referring physician is being contacted to request a home health referral in order to help provide continued rehabilitation services care for the patient.     Phillips Grout PT, DPT, GCS  Jaclyn Hall 05/01/2018, 4:13 PM  [place] MAIN [facility] SERVICES [address] [place], [place], [address] Phone: [phone]   Fax:  [phone]

## 2019-05-24 ENCOUNTER — Encounter: Payer: Self-pay | Admitting: Physical Therapy

## 2019-05-24 ENCOUNTER — Other Ambulatory Visit: Payer: Self-pay

## 2019-05-24 ENCOUNTER — Ambulatory Visit: Payer: Medicare HMO | Attending: Sports Medicine | Admitting: Physical Therapy

## 2019-05-24 DIAGNOSIS — M6281 Muscle weakness (generalized): Secondary | ICD-10-CM | POA: Diagnosis present

## 2019-05-24 DIAGNOSIS — G8929 Other chronic pain: Secondary | ICD-10-CM | POA: Diagnosis present

## 2019-05-24 DIAGNOSIS — R42 Dizziness and giddiness: Secondary | ICD-10-CM | POA: Insufficient documentation

## 2019-05-24 DIAGNOSIS — M545 Low back pain: Secondary | ICD-10-CM | POA: Diagnosis not present

## 2019-05-24 DIAGNOSIS — R262 Difficulty in walking, not elsewhere classified: Secondary | ICD-10-CM

## 2019-05-24 NOTE — Therapy (Signed)
[place] [facility] [address], [place], [address] Phone: [phone]   Fax:  [phone]  Physical Therapy Evaluation  Patient Details  Name: Jaclyn Hall MRN: [id number] Date of Birth: Apr 03, 1945 No data recorded  Encounter Date: 05/24/2019  PT End of Session - 05/24/19 1719    Visit Number  1    Number of Visits  17    Date for PT Re-Evaluation  07/19/19    PT Start Time  0500    PT Stop Time  0600    PT Time Calculation (min)  60 min    Activity Tolerance  Patient tolerated treatment well    Behavior During Therapy  [facility] for tasks assessed/performed       Past Medical History:  Diagnosis Date  . Arthritis   . Asthma   . Colon polyps   . Hypertension   . Osteoporosis   . Vertigo     Past Surgical History:  Procedure Laterality Date  . ABDOMINAL HYSTERECTOMY    . CHOLECYSTECTOMY    . COLONOSCOPY    . COLONOSCOPY N/A 11/22/2014   Procedure: COLONOSCOPY;  Surgeon: Lollie Sails, MD;  Location: [facility] ENDOSCOPY;  Service: Endoscopy;  Laterality: N/A;  . OOPHORECTOMY      There were no vitals filed for this visit.   OBJECTIVE  Mental Status Patient is oriented to person, place and time.  Recent memory is intact.  Remote memory is intact.  Attention span and concentration are intact.  Expressive speech is intact.  Patient's fund of knowledge is within normal limits for educational level.  SENSATION: Grossly intact to light touch bilateral LEs as determined by testing dermatomes L2-S2 Proprioception and hot/cold testing deferred on this date   MUSCULOSKELETAL: Tremor: None Bulk: Normal Tone: Normal No visible step-off along spinal column  Posture Lumbar lordosis: decreased Lower crossed syndrome (tight hip flexors and erector spinae; weak gluts and abs): negative  Gait SPC with R trendelenberg, compensated L. Decreased foot clearance bilat.   Palpation  TTP at bilat superior  glute fibers and QL    Strength (out of 5) R/L 5/5 Hip flexion 4+/4+ Hip ER 5/5 Hip IR 4-/4 Hip abduction 4/5 Hip adduction 4-/4- Hip extension 5/5 Knee extension 5/5 Knee flexion 5/5 Ankle dorsiflexion 5/5 Ankle plantarflexion 5 Trunk flexion 5 Trunk extension 5/5 Trunk rotation  *Indicates pain   AROM (degrees) R/L (all movements include overpressure unless otherwise stated) Lumbar forward flexion (65): 25% limited Lumbar extension (30): 50% limited Lumbar lateral flexion (25): WNL Thoracic and Lumbar rotation (30 degrees):  WNL bilat Hip IR (0-45): R: 2d L: 10d Hip ER (0-45): WNL bilat Hip Flexion (0-125): WNL bilat Hip Abduction (0-40): WNL bilat Hip extension (0-15): 10d bilat *Indicates pain   PROM (degrees) PROM = AROM Hip IR R 100% limited L 90%limited  Repeated Movements Centralization with prone prop and peripheralization with forward flex   Muscle Length Hamstrings: Negative bilat Ely: Negative bilat Thomas: Negative bilat Ober: Negative bilat   Passive Accessory Intervertebral Motion (PAIVM) Pt denies reproduction of back pain with CPA L1-L5 and UPA bilaterally L1-L5. Generally hypomobile throughout  Passive Physiological Intervertebral Motion (PPIVM) Normal flexion and extension with PPIVM testing   SPECIAL TESTS Lumbar Radiculopathy and Discogenic: Centralization and Peripheralization (SN 92, -LR 0.12): Positive centralization with prone prop Slump (SN 83, -LR 0.32): Negative bilat SLR (SN 92, -LR 0.29): Negative Crossed SLR (SP 90): Negative  Facet Joint: Extension-Rotation (SN 100, -  LR 0.0): Negative bilat  Lumbar Spinal Stenosis: Lumbar quadrant (SN 70): Negative bilat  Hip: FABER (SN 81): positive bilat FADIR (SN 94): R: positive L negative Hip scour (SN 50): R: positive L negative  SIJ:  Thigh Thrust (SN 88, -LR 0.18) : Negative bilat  Piriformis Syndrome: FAIR Test (SN 88, SP 83): Negative bilat  Functional  Tasks Squatting: Bilat heel raise, increased thoracic kyphosis Forward Step-Down Test: Unable, increased hip drop decreased stability Lateral Step-Down Test: Unable bilat, increased hip drop and knee valgus 5xSTS 15sec SLS R: 2sec L3sec BERG 48/56  Ther-Ex Patient able to demonstrate a set of the following therex with accuracy following demo and min cuing, and verbalize understanding of education on parameters, condition and anatomy. Access Code: [email or web address] Prone Press Up on Elbows - 1 x daily - 7 x weekly - 35min hold Standing Lumbar Extension - 8 x daily - 7 x weekly - 10 reps Seated Figure 4 Piriformis Stretch - 3 x daily - 7 x weekly - 30sec hold          PT Education - 05/24/19 1718    Education Details  Patient was educated on diagnosis, anatomy and pathology involved, prognosis, role of PT, and was given an HEP, demonstrating exercise with proper form following verbal and tactile cues, and was given a paper hand out to continue exercise at home. Pt was educated on and agreed to plan of care.    Person(s) Educated  Patient    Methods  Explanation;Demonstration;Verbal cues;Tactile cues;Handout    Comprehension  Verbalized understanding;Returned demonstration;Verbal cues required;Tactile cues required;Need further instruction       PT Short Term Goals - 05/25/19 0756      PT SHORT TERM GOAL #1   Title  Pt will be independent with HEP in order to improve strength and decrease back pain in order to improve pain-free function at home and work.    Baseline  05/24/19 HEP given    Time  4    Period  Weeks    Status  New        PT Long Term Goals - 05/25/19 0757      PT LONG TERM GOAL #1   Title  Patient will increase FOTO score to 71 to demonstrate predicted increase in functional mobility to complete ADLs    Baseline  05/24/19 55    Time  8    Period  Weeks    Status  New      PT LONG TERM GOAL #2   Title  Pt will decrease worst back pain as reported on NPRS by at least 2  points in order to demonstrate clinically significant reduction in back pain.    Baseline  05/24/19 10/10    Time  8    Period  Weeks    Status  New      PT LONG TERM GOAL #3   Title  Pt will decrease 5TSTS by at least 3 seconds in order to demonstrate clinically significant improvement in LE strength    Baseline  05/24/19 15sec    Time  8    Period  Weeks    Status  New      PT LONG TERM GOAL #4   Title  Patient will demonstrate bilat SLS of 11sec or more to demonstrate age-matched norms of reduced fall risk    Baseline  05/24/19 R 2sec L 3sec    Time  8    Period  Weeks  Status  New             Plan - 05/24/19 1802    Clinical Impression Statement  Pt is a 74 year old female presenting with LBP and bilat hip pain. Impairments in LBP, decreased hip mobility, decreased hip and core strength, decreased static and dynamic balance, and decreased activity tolerance. Activity limitations in prlonged walking/standing, lifting, squatting; inhibiting full participation in ind household ADLs. Would benefit from skilled PT to address above deficits and promote optimal return to PLOF.    Personal Factors and Comorbidities  Age;Time since onset of injury/illness/exacerbation;Comorbidity 1;Comorbidity 2;Fitness;Past/Current Experience;Education    Comorbidities  OA, HTN, osteoporosis    Examination-Activity Limitations  Lift;Stairs;Squat;Transfers;Stand    Examination-Participation Restrictions  Church;Cleaning;Community Activity;Meal Prep;Yard Work    [employer]  Evolving/Moderate complexity    Clinical Decision Making  Moderate    Rehab Potential  Good    PT Frequency  2x / week    PT Duration  8 weeks    PT Treatment/Interventions  Cryotherapy;Moist Heat;Ultrasound;Gait training;Stair training;Functional mobility training;Therapeutic activities;Therapeutic exercise;Balance training;Neuromuscular re-education;Patient/family education;Orthotic Fit/Training;Manual  techniques;Scar mobilization;Dry needling;Energy conservation;Electrical Stimulation;Canalith Repostioning;Vestibular    PT Next Visit Plan  Increase mobility, assess repeated motion    PT Home Exercise Plan  lumbar ext, prone prop, piriformis stretch    Consulted and Agree with Plan of Care  Patient       Patient will benefit from skilled therapeutic intervention in order to improve the following deficits and impairments:  Abnormal gait, Decreased endurance, Impaired sensation, Decreased activity tolerance, Decreased strength, Pain, Decreased balance, Decreased mobility, Difficulty walking, Improper body mechanics, Decreased range of motion, Decreased coordination, Postural dysfunction, Impaired flexibility  Visit Diagnosis: Chronic bilateral low back pain without sciatica  Difficulty in walking, not elsewhere classified  Muscle weakness (generalized)  Dizziness and giddiness     Problem List Patient Active Problem List   Diagnosis Date Noted  . Allergic state 03/14/2017  . Arthritis 03/14/2017  . Hypertension 03/14/2017  . Osteoporosis, post-menopausal 03/14/2017  . Benign paroxysmal positional vertigo due to bilateral vestibular disorder 06/30/2015   Shelton Silvas PT, DPT Shelton Silvas 05/25/2019, 8:03 AM  [place] [facility] [address] [place], [address] Phone: [phone]   Fax:  [phone]  Name: Jaclyn Hall MRN: [id number] Date of Birth: Mar 11, 1945

## 2019-05-29 ENCOUNTER — Ambulatory Visit: Payer: Medicare HMO | Admitting: Physical Therapy

## 2019-05-29 ENCOUNTER — Encounter: Payer: Self-pay | Admitting: Physical Therapy

## 2019-05-29 ENCOUNTER — Other Ambulatory Visit: Payer: Self-pay

## 2019-05-29 DIAGNOSIS — M6281 Muscle weakness (generalized): Secondary | ICD-10-CM

## 2019-05-29 DIAGNOSIS — M545 Low back pain, unspecified: Secondary | ICD-10-CM

## 2019-05-29 DIAGNOSIS — R262 Difficulty in walking, not elsewhere classified: Secondary | ICD-10-CM

## 2019-05-29 DIAGNOSIS — R42 Dizziness and giddiness: Secondary | ICD-10-CM

## 2019-05-29 DIAGNOSIS — G8929 Other chronic pain: Secondary | ICD-10-CM

## 2019-05-29 NOTE — Therapy (Addendum)
[place] [facility] [address], [place], [address] Phone: [phone]   Fax:  [phone]  Physical Therapy Treatment  Patient Details  Name: Jaclyn Hall MRN: [id number] Date of Birth: June 19, 1945 No data recorded  Encounter Date: 05/29/2019    Past Medical History:  Diagnosis Date  . Arthritis   . Asthma   . Colon polyps   . Hypertension   . Osteoporosis   . Vertigo     Past Surgical History:  Procedure Laterality Date  . ABDOMINAL HYSTERECTOMY    . CHOLECYSTECTOMY    . COLONOSCOPY    . COLONOSCOPY N/A 11/22/2014   Procedure: COLONOSCOPY;  Surgeon: Lollie Sails, MD;  Location: [facility] ENDOSCOPY;  Service: Endoscopy;  Laterality: N/A;  . OOPHORECTOMY      There were no vitals filed for this visit.       Ther-Ex Nustep L2 seat setting 7 UE setting 10 SPM above 60 for gentle motion and strengthening Piriformis stretch 30sec bilat HEP review Post pelvic tilt x5 with great carry over of core activation Carry over of TA activation to bridge x10 with good form following cuing; bridge with RTB at knees for increased hip abd 2x 10 with min cuing to maintain abd tension with good carry over Alt prone hip ext x10; alt supermans 2x 10 with cuing to prevent rotation with good carry over Mini squat 3x 10/9/8 with UE flex with patient reporting good activation of TA and good carry over of all cuing for technique Standing hip abd unilateral UE support 2x 10 with increased difficulty with R SLS stance with cuing for posture, decent carry over                       PT Short Term Goals - 05/25/19 0756      PT SHORT TERM GOAL #1   Title  Pt will be independent with HEP in order to improve strength and decrease back pain in order to improve pain-free function at home and work.    Baseline  05/24/19 HEP given    Time  4    Period  Weeks    Status  New        PT Long Term Goals - 05/25/19  0757      PT LONG TERM GOAL #1   Title  Patient will increase FOTO score to 71 to demonstrate predicted increase in functional mobility to complete ADLs    Baseline  05/24/19 55    Time  8    Period  Weeks    Status  New      PT LONG TERM GOAL #2   Title  Pt will decrease worst back pain as reported on NPRS by at least 2 points in order to demonstrate clinically significant reduction in back pain.    Baseline  05/24/19 10/10    Time  8    Period  Weeks    Status  New      PT LONG TERM GOAL #3   Title  Pt will decrease 5TSTS by at least 3 seconds in order to demonstrate clinically significant improvement in LE strength    Baseline  05/24/19 15sec    Time  8    Period  Weeks    Status  New      PT LONG TERM GOAL #4   Title  Patient will demonstrate bilat SLS of 11sec or more to demonstrate  age-matched norms of reduced fall risk    Baseline  05/24/19 R 2sec L 3sec    Time  8    Period  Weeks    Status  New              Patient will benefit from skilled therapeutic intervention in order to improve the following deficits and impairments:  Abnormal gait, Decreased endurance, Impaired sensation, Decreased activity tolerance, Decreased strength, Pain, Decreased balance, Decreased mobility, Difficulty walking, Improper body mechanics, Decreased range of motion, Decreased coordination, Postural dysfunction, Impaired flexibility  Visit Diagnosis: Difficulty in walking, not elsewhere classified  Muscle weakness (generalized)  Dizziness and giddiness  Chronic bilateral low back pain without sciatica     Problem List Patient Active Problem List   Diagnosis Date Noted  . Allergic state 03/14/2017  . Arthritis 03/14/2017  . Hypertension 03/14/2017  . Osteoporosis, post-menopausal 03/14/2017  . Benign paroxysmal positional vertigo due to bilateral vestibular disorder 06/30/2015   Shelton Silvas PT, DPT Shelton Silvas 05/31/2019, 10:00 AM  [place] [facility] [address], [place], [address] Phone: [phone]   Fax:  [phone]  Name: KAY BOECK MRN: [id number] Date of Birth: 10-20-1945

## 2019-05-31 ENCOUNTER — Encounter: Payer: Self-pay | Admitting: Physical Therapy

## 2019-05-31 ENCOUNTER — Ambulatory Visit: Payer: Medicare HMO | Admitting: Physical Therapy

## 2019-05-31 ENCOUNTER — Other Ambulatory Visit: Payer: Self-pay

## 2019-05-31 DIAGNOSIS — R262 Difficulty in walking, not elsewhere classified: Secondary | ICD-10-CM

## 2019-05-31 DIAGNOSIS — M6281 Muscle weakness (generalized): Secondary | ICD-10-CM

## 2019-05-31 DIAGNOSIS — R42 Dizziness and giddiness: Secondary | ICD-10-CM

## 2019-05-31 DIAGNOSIS — G8929 Other chronic pain: Secondary | ICD-10-CM

## 2019-05-31 DIAGNOSIS — M545 Low back pain: Secondary | ICD-10-CM | POA: Diagnosis not present

## 2019-05-31 NOTE — Therapy (Signed)
[place] [facility] [address], [place], [address] Phone: [phone]   Fax:  [phone]  Physical Therapy Treatment  Patient Details  Name: Jaclyn Hall MRN: [id number] Date of Birth: [birth year] No data recorded  Encounter Date: 05/31/2019  PT End of Session - 05/31/19 0955    Visit Number  3    Number of Visits  17    Date for PT Re-Evaluation  07/19/19    PT Start Time  0950    PT Stop Time  1028    PT Time Calculation (min)  38 min    Activity Tolerance  Patient tolerated treatment well    Behavior During Therapy  [facility] for tasks assessed/performed       Past Medical History:  Diagnosis Date  . Arthritis   . Asthma   . Colon polyps   . Hypertension   . Osteoporosis   . Vertigo     Past Surgical History:  Procedure Laterality Date  . ABDOMINAL HYSTERECTOMY    . CHOLECYSTECTOMY    . COLONOSCOPY    . COLONOSCOPY N/A 11/22/2014   Procedure: COLONOSCOPY;  Surgeon: Lollie Sails, MD;  Location[facility] ENDOSCOPY;  Service: Endoscopy;  Laterality: N/A;  . OOPHORECTOMY      There were no vitals filed for this visit.  Subjective Assessment - 05/31/19 0953    Subjective  Reports that her back is feeling better, but that her L knee is continuing to give her some pain with the weather.    Pertinent History  Patient is a 74 year female presenting with LBP and bilat knee pain. Pertinent history of T4/5 laminectomy Nov 2019. Patient reports LBP over the past 3 weeks with insideous onset. Reports LBP radiates into bilat buttock, reporting this pain is sore and dull/achy. Pain is aggravated by prolonged sitting (does report this is more d/t LE stiffness and neuropathy), but reports that pain comes on randomly, though she thinks pain is worse at the end of the day. She cannot pinpoint any aggravating activies, but does report when it comes on she is unable to complete any household tasks. Worst pain in past week  10/10 best: 0/10. She is retired, but likes walk often outside about 74mins, and cook. Uses a quadcane that she has used since spinal surgery. Patient lives with husband and son next door with 4 steps to enter home with handrail, tub/shower. Denies falls in past 43months. Pt denies N/V, B&B changes, unexplained weight fluctuation, saddle paresthesia, fever, night sweats, or unrelenting night pain at this time.    Limitations  Lifting;House hold activities;Walking;Standing    How long can you sit comfortably?  65min    How long can you stand comfortably?  52min    How long can you walk comfortably?  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[employer]), return to [employer] (independent)    Pain Onset  More than a month ago    Pain Onset  1 to 4 weeks ago       Ther-Ex Nustep L2 seat setting 7 UE setting 10 SPM above 60 for gentle motion and strengthening Piriformis stretch 30sec bilat SKTC x30sec bilat Bridge x10 with some increased LBP activation; second set of 10 with x10 glute set prior with patient reporting more glute than LB activation Alt supermans 3x 10 with  cuing to prevent rotation through core activation with good carry over Mini squat 2x 10 with UE flex with patient good carry over of proper technique Lateral band walks in mini squat  RTB 4x 37ft with min cuing for eccentric control  Standing hip abd unilateral UE support 2x 10 with increased difficulty with R SLS stance with cuing for posture, decent carry over                          PT Education - 05/31/19 0955    Education Details  Therex form/technique    Person(s) Educated  Patient    Methods  Explanation;Demonstration;Tactile cues;Verbal cues    Comprehension  Verbalized understanding;Returned demonstration;Verbal cues required       PT Short Term Goals - 05/25/19 0756      PT SHORT TERM GOAL #1    Title  Pt will be independent with HEP in order to improve strength and decrease back pain in order to improve pain-free function at home and work.    Baseline  05/24/19 HEP given    Time  4    Period  Weeks    Status  New        PT Long Term Goals - 05/25/19 0757      PT LONG TERM GOAL #1   Title  Patient will increase FOTO score to 71 to demonstrate predicted increase in functional mobility to complete ADLs    Baseline  05/24/19 55    Time  8    Period  Weeks    Status  New      PT LONG TERM GOAL #2   Title  Pt will decrease worst back pain as reported on NPRS by at least 2 points in order to demonstrate clinically significant reduction in back pain.    Baseline  05/24/19 10/10    Time  8    Period  Weeks    Status  New      PT LONG TERM GOAL #3   Title  Pt will decrease 5TSTS by at least 3 seconds in order to demonstrate clinically significant improvement in LE strength    Baseline  05/24/19 15sec    Time  8    Period  Weeks    Status  New      PT LONG TERM GOAL #4   Title  Patient will demonstrate bilat SLS of 11sec or more to demonstrate age-matched norms of reduced fall risk    Baseline  05/24/19 R 2sec L 3sec    Time  8    Period  Weeks    Status  New            Plan - 05/31/19 1005    Clinical Impression Statement  PT continued therex progression for increased hip and core stability/strength with success. Patient is able to comply with all cuing for proper technique and muscle activation with good motivation throughout session, without increased pain. PT will continue progression as able.    Personal Factors and Comorbidities  Age;Time since onset of injury/illness/exacerbation;Comorbidity 1;Comorbidity 2;Fitness;Past/Current Experience;Education    Comorbidities  OA, HTN, osteoporosis    Examination-Activity Limitations  Lift;Stairs;Squat;Transfers;Stand    Examination-Participation Restrictions  Church;Cleaning;Community Activity;Meal Prep;Yard Work     Stability/Clinical Decision Making  Evolving/Moderate complexity    Clinical Decision Making  Moderate    Rehab Potential  Good    PT Frequency  2x / week    PT Duration  8 weeks    PT Treatment/Interventions  Cryotherapy;Moist Heat;Ultrasound;Gait training;Stair training;Functional mobility training;Therapeutic activities;Therapeutic exercise;Balance training;Neuromuscular re-education;Patient/family education;Orthotic Fit/Training;Manual techniques;Scar mobilization;Dry needling;Energy conservation;Electrical Stimulation;Canalith Repostioning;Vestibular    PT Next Visit Plan  Increase mobility, assess repeated motion    PT Home Exercise Plan  lumbar ext, prone prop, piriformis stretch    Consulted and Agree with Plan of Care  Patient       Patient will benefit from skilled therapeutic intervention in order to improve the following deficits and impairments:  Abnormal gait, Decreased endurance, Impaired sensation, Decreased activity tolerance, Decreased strength, Pain, Decreased balance, Decreased mobility, Difficulty walking, Improper body mechanics, Decreased range of motion, Decreased coordination, Postural dysfunction, Impaired flexibility  Visit Diagnosis: Difficulty in walking, not elsewhere classified  Muscle weakness (generalized)  Dizziness and giddiness  Chronic bilateral low back pain without sciatica     Problem List Patient Active Problem List   Diagnosis Date Noted  . Allergic state 03/14/2017  . Arthritis 03/14/2017  . Hypertension 03/14/2017  . Osteoporosis, post-menopausal 03/14/2017  . Benign paroxysmal positional vertigo due to bilateral vestibular disorder 06/30/2015   Shelton Silvas PT, DPT Shelton Silvas 05/31/2019, 10:28 AM  [place] [facility] [address] [place], [address] Phone: [phone]   Fax:  [phone]  Name: Jaclyn Hall MRN: [id number] Date of Birth: [birth year]

## 2019-06-04 ENCOUNTER — Ambulatory Visit: Payer: Medicare HMO | Admitting: Physical Therapy

## 2019-06-07 ENCOUNTER — Other Ambulatory Visit: Payer: Self-pay

## 2019-06-07 ENCOUNTER — Encounter: Payer: Self-pay | Admitting: Physical Therapy

## 2019-06-07 ENCOUNTER — Ambulatory Visit: Payer: Medicare HMO | Admitting: Physical Therapy

## 2019-06-07 DIAGNOSIS — R262 Difficulty in walking, not elsewhere classified: Secondary | ICD-10-CM

## 2019-06-07 DIAGNOSIS — M545 Low back pain, unspecified: Secondary | ICD-10-CM

## 2019-06-07 DIAGNOSIS — G8929 Other chronic pain: Secondary | ICD-10-CM

## 2019-06-07 DIAGNOSIS — R42 Dizziness and giddiness: Secondary | ICD-10-CM

## 2019-06-07 DIAGNOSIS — M6281 Muscle weakness (generalized): Secondary | ICD-10-CM

## 2019-06-07 NOTE — Therapy (Signed)
[facility] [address] [place], [address] Phone: [phone]   Fax:  [phone]  Physical Therapy Treatment  Patient Details  Name: Jaclyn Hall MRN: [id number] Date of Birth: 1945-10-11 No data recorded  Encounter Date: 06/07/2019  PT End of Session - 06/07/19 1037    Visit Number  4    Number of Visits  17    PT Start Time  1033    PT Stop Time  1111    PT Time Calculation (min)  38 min    Activity Tolerance  Patient tolerated treatment well    Behavior During Therapy  [facility] for tasks assessed/performed       Past Medical History:  Diagnosis Date  . Arthritis   . Asthma   . Colon polyps   . Hypertension   . Osteoporosis   . Vertigo     Past Surgical History:  Procedure Laterality Date  . ABDOMINAL HYSTERECTOMY    . CHOLECYSTECTOMY    . COLONOSCOPY    . COLONOSCOPY N/A 11/22/2014   Procedure: COLONOSCOPY;  Surgeon: Lollie Sails, MD;  Location: [facility] ENDOSCOPY;  Service: Endoscopy;  Laterality: N/A;  . OOPHORECTOMY      There were no vitals filed for this visit.  Subjective Assessment - 06/07/19 1035    Subjective  Reports that her back is not too bad, and that her L knee is giving her about 4/10 pain today. Compliance with HEP.    Pertinent History  Patient is a 74 year female presenting with LBP and bilat knee pain. Pertinent history of T4/5 laminectomy Nov 2019. Patient reports LBP over the past 3 weeks with insideous onset. Reports LBP radiates into bilat buttock, reporting this pain is sore and dull/achy. Pain is aggravated by prolonged sitting (does report this is more d/t LE stiffness and neuropathy), but reports that pain comes on randomly, though she thinks pain is worse at the end of the day. She cannot pinpoint any aggravating activies, but does report when it comes on she is unable to complete any household tasks. Worst pain in past week 10/10 best: 0/10. She is retired, but  likes walk often outside about 34mins, and cook. Uses a quadcane that she has used since spinal surgery. Patient lives with husband and son next door with 4 steps to enter home with handrail, tub/shower. Denies falls in past 21months. Pt denies N/V, B&B changes, unexplained weight fluctuation, saddle paresthesia, fever, night sweats, or unrelenting night pain at this time.    Limitations  Lifting;House hold activities;Walking;Standing    How long can you sit comfortably?  13min    How long can you stand comfortably?  77min    How long can you walk comfortably?  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)    Pain Onset  More than a month ago       Ther-Ex Nustep L2 seat setting 7 UE setting 10 38mins SPM above 60 for gentle motion and strengthening SKTC x30sec bilat; patient reports tension in the groin with R SKTC Hooklying butterfly stretch x76min hold Bridge 2x 10; with GTB at knees for abd activation x10; with glute set prior for hip ext activation with good carry over Alt supermans 3x 10 with cuing to prevent rotation through core activation with good  carry over Mini squat with UE flex x10; with GTB at knees 2x 10/6  with patient good carry over of proper technique Lateral step up onto 4in step x10; 6in step 2x 10 each LE with min cuing for LE alignment with good carry over                          PT Education - 06/07/19 1036    Education Details  Therex form, technique    Person(s) Educated  Patient    Methods  Explanation;Demonstration;Verbal cues    Comprehension  Verbalized understanding;Returned demonstration;Verbal cues required       PT Short Term Goals - 05/25/19 0756      PT SHORT TERM GOAL #1   Title  Pt will be independent with HEP in order to improve strength and decrease back pain in order to improve pain-free  function at home and work.    Baseline  05/24/19 HEP given    Time  4    Period  Weeks    Status  New        PT Long Term Goals - 05/25/19 0757      PT LONG TERM GOAL #1   Title  Patient will increase FOTO score to 71 to demonstrate predicted increase in functional mobility to complete ADLs    Baseline  05/24/19 55    Time  8    Period  Weeks    Status  New      PT LONG TERM GOAL #2   Title  Pt will decrease worst back pain as reported on NPRS by at least 2 points in order to demonstrate clinically significant reduction in back pain.    Baseline  05/24/19 10/10    Time  8    Period  Weeks    Status  New      PT LONG TERM GOAL #3   Title  Pt will decrease 5TSTS by at least 3 seconds in order to demonstrate clinically significant improvement in LE strength    Baseline  05/24/19 15sec    Time  8    Period  Weeks    Status  New      PT LONG TERM GOAL #4   Title  Patient will demonstrate bilat SLS of 11sec or more to demonstrate age-matched norms of reduced fall risk    Baseline  05/24/19 R 2sec L 3sec    Time  8    Period  Weeks    Status  New            Plan - 06/07/19 1109    Clinical Impression Statement  PT continued therex progression for increased hip and core strength with good success. Patien tis able to comply with all cuing for proper posture/LE alignment and technique of therex with good motivation throughout session, without increased pain. PT will continue progression as able.    Personal Factors and Comorbidities  Age;Time since onset of injury/illness/exacerbation;Comorbidity 1;Comorbidity 2;Fitness;Past/Current Experience;Education    Comorbidities  OA, HTN, osteoporosis    Examination-Activity Limitations  Lift;Stairs;Squat;Transfers;Stand    Examination-Participation Restrictions  Church;Cleaning;Community Activity;Meal Prep;Yard Work    [employer]chant [employer] officer  Evolving/Moderate complexity    Clinical Decision Making  Moderate    Rehab Potential   Good    PT Frequency  2x / week    PT Duration  8 weeks    PT Treatment/Interventions  Cryotherapy;Moist Heat;Ultrasound;Gait training;Stair training;Functional mobility training;Therapeutic activities;Therapeutic  exercise;Balance training;Neuromuscular re-education;Patient/family education;Orthotic Fit/Training;Manual techniques;Scar mobilization;Dry needling;Energy conservation;Electrical Stimulation;Canalith Repostioning;Vestibular    PT Next Visit Plan  Increase mobility, assess repeated motion    PT Home Exercise Plan  lumbar ext, prone prop, piriformis stretch    Consulted and Agree with Plan of Care  Patient       Patient will benefit from skilled therapeutic intervention in order to improve the following deficits and impairments:  Abnormal gait, Decreased endurance, Impaired sensation, Decreased activity tolerance, Decreased strength, Pain, Decreased balance, Decreased mobility, Difficulty walking, Improper body mechanics, Decreased range of motion, Decreased coordination, Postural dysfunction, Impaired flexibility  Visit Diagnosis: Difficulty in walking, not elsewhere classified  Muscle weakness (generalized)  Dizziness and giddiness  Chronic bilateral low back pain without sciatica     Problem List Patient Active Problem List   Diagnosis Date Noted  . Allergic state 03/14/2017  . Arthritis 03/14/2017  . Hypertension 03/14/2017  . Osteoporosis, post-menopausal 03/14/2017  . Benign paroxysmal positional vertigo due to bilateral vestibular disorder 06/30/2015   Shelton Silvas PT, DPT Shelton Silvas 06/07/2019, 11:12 AM  [place] [address] [place], [address] Phone: [phone]   Fax:  [phone]  Name: Jaclyn Hall MRN: [id number] Date of Birth: 06/27/1945

## 2019-06-11 ENCOUNTER — Other Ambulatory Visit: Payer: Self-pay

## 2019-06-11 ENCOUNTER — Ambulatory Visit: Payer: Medicare HMO | Admitting: Physical Therapy

## 2019-06-11 ENCOUNTER — Encounter: Payer: Medicare Other | Admitting: Physical Therapy

## 2019-06-11 ENCOUNTER — Encounter: Payer: Self-pay | Admitting: Physical Therapy

## 2019-06-11 DIAGNOSIS — M545 Low back pain: Secondary | ICD-10-CM | POA: Diagnosis not present

## 2019-06-11 DIAGNOSIS — G8929 Other chronic pain: Secondary | ICD-10-CM

## 2019-06-11 DIAGNOSIS — M6281 Muscle weakness (generalized): Secondary | ICD-10-CM

## 2019-06-11 DIAGNOSIS — R262 Difficulty in walking, not elsewhere classified: Secondary | ICD-10-CM

## 2019-06-11 NOTE — Therapy (Signed)
[place] [facility] [address], [place], [address] Phone: [phone]   Fax:  [phone]  Physical Therapy Treatment  Patient Details  Name: Jaclyn Hall MRN: [id number] Date of Birth: 10-18-1945 No data recorded  Encounter Date: 06/11/2019  PT End of Session - 06/11/19 0905    Visit Number  5    Number of Visits  17    Date for PT Re-Evaluation  07/19/19    PT Start Time  0902    PT Stop Time  0940    PT Time Calculation (min)  38 min    Activity Tolerance  Patient tolerated treatment well    Behavior During Therapy  [facility] for tasks assessed/performed       Past Medical History:  Diagnosis Date  . Arthritis   . Asthma   . Colon polyps   . Hypertension   . Osteoporosis   . Vertigo     Past Surgical History:  Procedure Laterality Date  . ABDOMINAL HYSTERECTOMY    . CHOLECYSTECTOMY    . COLONOSCOPY    . COLONOSCOPY N/A 11/22/2014   Procedure: COLONOSCOPY;  Surgeon: Lollie Sails, MD;  Location: [facility] ENDOSCOPY;  Service: Endoscopy;  Laterality: N/A;  . OOPHORECTOMY      There were no vitals filed for this visit.  Subjective Assessment - 06/11/19 0903    Subjective  Patient reports that she is a little tired today because she did a lot of cooking yesterday and that she didn't have any LBP over the weekend, which she is pleased with. Good compliance with HEP.    Pertinent History  Patient is a 74 year female presenting with LBP and bilat knee pain. Pertinent history of T4/5 laminectomy Nov 2019. Patient reports LBP over the past 3 weeks with insideous onset. Reports LBP radiates into bilat buttock, reporting this pain is sore and dull/achy. Pain is aggravated by prolonged sitting (does report this is more d/t LE stiffness and neuropathy), but reports that pain comes on randomly, though she thinks pain is worse at the end of the day. She cannot pinpoint any aggravating activies, but does report when it  comes on she is unable to complete any household tasks. Worst pain in past week 10/10 best: 0/10. She is retired, but likes walk often outside about 34mins, and cook. Uses a quadcane that she has used since spinal surgery. Patient lives with husband and son next door with 4 steps to enter home with handrail, tub/shower. Denies falls in past 17months. Pt denies N/V, B&B changes, unexplained weight fluctuation, saddle paresthesia, fever, night sweats, or unrelenting night pain at this time.    Limitations  Lifting;House hold activities;Walking;Standing    How long can you sit comfortably?  23min    How long can you stand comfortably?  69min    How long can you walk comfortably?  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)    Pain Onset  More than a month ago    Pain Onset  1 to 4 weeks ago         Ther-Ex Nustep L2 seat setting 7 UE setting 10 58mins SPM above 60 for gentle motion and strengthening SKTC x30sec bilat reports this "feels good" Hooklying butterfly stretch x64min hold Bridge with GTB at knees for abd  activation 3 x10; with min cuing to maintain abd with good carry over Post pelvic tilt x15 3sec hold with demo and max cuing initially for proper technique and core activation with good carry over Dead bug position ball squeeze 3x 5 with good carry over following demo and cuing, with patient reporting she "feels this in her core" Mini squatwith GTB at knees x10; with GTB and holding 4# DB bilat UE flex  Lateral step up onto  6in step 2x 10 each LE with min cuing for LE alignment with good carry over                         PT Education - 06/11/19 0905    Education Details  therex form, technique    Person(s) Educated  Patient    Methods  Explanation;Demonstration;Verbal cues    Comprehension  Verbalized  understanding;Returned demonstration;Verbal cues required       PT Short Term Goals - 05/25/19 0756      PT SHORT TERM GOAL #1   Title  Pt will be independent with HEP in order to improve strength and decrease back pain in order to improve pain-free function at home and work.    Baseline  05/24/19 HEP given    Time  4    Period  Weeks    Status  New        PT Long Term Goals - 05/25/19 0757      PT LONG TERM GOAL #1   Title  Patient will increase FOTO score to 71 to demonstrate predicted increase in functional mobility to complete ADLs    Baseline  05/24/19 55    Time  8    Period  Weeks    Status  New      PT LONG TERM GOAL #2   Title  Pt will decrease worst back pain as reported on NPRS by at least 2 points in order to demonstrate clinically significant reduction in back pain.    Baseline  05/24/19 10/10    Time  8    Period  Weeks    Status  New      PT LONG TERM GOAL #3   Title  Pt will decrease 5TSTS by at least 3 seconds in order to demonstrate clinically significant improvement in LE strength    Baseline  05/24/19 15sec    Time  8    Period  Weeks    Status  New      PT LONG TERM GOAL #4   Title  Patient will demonstrate bilat SLS of 11sec or more to demonstrate age-matched norms of reduced fall risk    Baseline  05/24/19 R 2sec L 3sec    Time  8    Period  Weeks    Status  New            Plan - 06/11/19 [id number]    Clinical Impression Statement  PT continued therex progression for increased hip and core strength, with graded exercise approach with good success. Paitent is able to complete all therex progression without increased pain, with good motivation throughout session. Patient is able to comply with all cuing for proper therex technique, and muscle activation. PT will continue progression as able.    Personal Factors and Comorbidities  Age;Time since onset of injury/illness/exacerbation;Comorbidity 1;Comorbidity 2;Fitness;Past/Current Experience;Education     Comorbidities  OA, HTN, osteoporosis    Examination-Activity Limitations  Lift;Stairs;Squat;Transfers;Stand    Examination-Participation  Restrictions  Church;Cleaning;Community Activity;Meal Prep;Yard Work    [employer]chant [employer] officer  Evolving/Moderate complexity    Clinical Decision Making  Moderate    Rehab Potential  Good    PT Frequency  2x / week    PT Duration  8 weeks    PT Treatment/Interventions  Cryotherapy;Moist Heat;Ultrasound;Gait training;Stair training;Functional mobility training;Therapeutic activities;Therapeutic exercise;Balance training;Neuromuscular re-education;Patient/family education;Orthotic Fit/Training;Manual techniques;Scar mobilization;Dry needling;Energy conservation;Electrical Stimulation;Canalith Repostioning;Vestibular    PT Next Visit Plan  Increase mobility, assess repeated motion    PT Home Exercise Plan  lumbar ext, prone prop, piriformis stretch    Consulted and Agree with Plan of Care  Patient       Patient will benefit from skilled therapeutic intervention in order to improve the following deficits and impairments:  Abnormal gait, Decreased endurance, Impaired sensation, Decreased activity tolerance, Decreased strength, Pain, Decreased balance, Decreased mobility, Difficulty walking, Improper body mechanics, Decreased range of motion, Decreased coordination, Postural dysfunction, Impaired flexibility  Visit Diagnosis: Difficulty in walking, not elsewhere classified  Muscle weakness (generalized)  Chronic bilateral low back pain without sciatica     Problem List Patient Active Problem List   Diagnosis Date Noted  . Allergic state 03/14/2017  . Arthritis 03/14/2017  . Hypertension 03/14/2017  . Osteoporosis, post-menopausal 03/14/2017  . Benign paroxysmal positional vertigo due to bilateral vestibular disorder 06/30/2015   Shelton Silvas PT, DPT Shelton Silvas 06/11/2019, 9:39 AM  [place]  [facility] [address] [place], [address] Phone: [phone]   Fax:  [phone]  Name: Jaclyn Hall MRN: [id number] Date of Birth: 19-Feb-1945

## 2019-06-13 ENCOUNTER — Ambulatory Visit: Payer: Medicare HMO | Admitting: Physical Therapy

## 2019-06-13 ENCOUNTER — Encounter: Payer: Self-pay | Admitting: Physical Therapy

## 2019-06-13 ENCOUNTER — Other Ambulatory Visit: Payer: Self-pay

## 2019-06-13 DIAGNOSIS — M6281 Muscle weakness (generalized): Secondary | ICD-10-CM

## 2019-06-13 DIAGNOSIS — G8929 Other chronic pain: Secondary | ICD-10-CM

## 2019-06-13 DIAGNOSIS — R42 Dizziness and giddiness: Secondary | ICD-10-CM

## 2019-06-13 DIAGNOSIS — M545 Low back pain: Secondary | ICD-10-CM | POA: Diagnosis not present

## 2019-06-13 DIAGNOSIS — R262 Difficulty in walking, not elsewhere classified: Secondary | ICD-10-CM

## 2019-06-13 NOTE — Therapy (Signed)
[place] [facility] [address] [place], [address] Phone: [phone]   Fax:  [phone]  Physical Therapy Treatment  Patient Details  Name: Jaclyn Hall MRN: [id number] Date of Birth: [birth year] No data recorded  Encounter Date: 06/13/2019  PT End of Session - 06/13/19 1035    Visit Number  6    Number of Visits  17    Date for PT Re-Evaluation  07/19/19    PT Start Time  1030    PT Stop Time  1110    PT Time Calculation (min)  40 min    Activity Tolerance  Patient tolerated treatment well    Behavior During Therapy  [facility] for tasks assessed/performed       Past Medical History:  Diagnosis Date  . Arthritis   . Asthma   . Colon polyps   . Hypertension   . Osteoporosis   . Vertigo     Past Surgical History:  Procedure Laterality Date  . ABDOMINAL HYSTERECTOMY    . CHOLECYSTECTOMY    . COLONOSCOPY    . COLONOSCOPY N/A 11/22/2014   Procedure: COLONOSCOPY;  Surgeon: Lollie Sails, MD;  Location: [facility] ENDOSCOPY;  Service: Endoscopy;  Laterality: N/A;  . OOPHORECTOMY      There were no vitals filed for this visit.  Subjective Assessment - 06/13/19 1033    Subjective  Reports no pain today, good compliance with HEP.    Pertinent History  Patient is a 74 year female presenting with LBP and bilat knee pain. Pertinent history of T4/5 laminectomy Nov 2019. Patient reports LBP over the past 3 weeks with insideous onset. Reports LBP radiates into bilat buttock, reporting this pain is sore and dull/achy. Pain is aggravated by prolonged sitting (does report this is more d/t LE stiffness and neuropathy), but reports that pain comes on randomly, though she thinks pain is worse at the end of the day. She cannot pinpoint any aggravating activies, but does report when it comes on she is unable to complete any household tasks. Worst pain in past week 10/10 best: 0/10. She is retired, but likes walk often outside  about 63mins, and cook. Uses a quadcane that she has used since spinal surgery. Patient lives with husband and son next door with 4 steps to enter home with handrail, tub/shower. Denies falls in past 53months. Pt denies N/V, B&B changes, unexplained weight fluctuation, saddle paresthesia, fever, night sweats, or unrelenting night pain at this time.    Limitations  Lifting;House hold activities;Walking;Standing    How long can you sit comfortably?  77min    How long can you stand comfortably?  32min    How long can you walk comfortably?  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)    Pain Onset  More than a month ago       Ther-Ex Nustep L2 seat setting 7 UE setting 124minsSPM above 60 for gentle motion and strengthening SKTC x30sec bilat reports this "feels good" Hooklying butterfly stretch x66min hold Prone alt superman 3x 10 with min cuing for core activation with good carry over Post pelvic tilt x15 3sec hold with demo and max cuing initially for proper technique and core activation with good carry over Dead bug position ball squeeze 3x 5/6 with good carry over following demo  and cuing, with patient reporting she "feels this in her core" Mini squatwith GTB at knees 3x 10 with cuing to "pop up" from stand for quick stand with good carry over Palloff press in mini squat 5# 2x 10 each side with demo and max cuing initially for proper technique with good carry over                          PT Education - 06/13/19 1035    Education Details  therex form/technique    Person(s) Educated  Patient    Methods  Explanation;Demonstration;Verbal cues    Comprehension  Verbalized understanding;Verbal cues required;Returned demonstration       PT Short Term Goals - 05/25/19 0756      PT SHORT TERM GOAL #1   Title  Pt will be independent  with HEP in order to improve strength and decrease back pain in order to improve pain-free function at home and work.    Baseline  05/24/19 HEP given    Time  4    Period  Weeks    Status  New        PT Long Term Goals - 05/25/19 0757      PT LONG TERM GOAL #1   Title  Patient will increase FOTO score to 71 to demonstrate predicted increase in functional mobility to complete ADLs    Baseline  05/24/19 55    Time  8    Period  Weeks    Status  New      PT LONG TERM GOAL #2   Title  Pt will decrease worst back pain as reported on NPRS by at least 2 points in order to demonstrate clinically significant reduction in back pain.    Baseline  05/24/19 10/10    Time  8    Period  Weeks    Status  New      PT LONG TERM GOAL #3   Title  Pt will decrease 5TSTS by at least 3 seconds in order to demonstrate clinically significant improvement in LE strength    Baseline  05/24/19 15sec    Time  8    Period  Weeks    Status  New      PT LONG TERM GOAL #4   Title  Patient will demonstrate bilat SLS of 11sec or more to demonstrate age-matched norms of reduced fall risk    Baseline  05/24/19 R 2sec L 3sec    Time  8    Period  Weeks    Status  New            Plan - 06/13/19 1037    Clinical Impression Statement  PT continued therex progression for increased hip and core strength with good success. Patient is able to comply with all cuing for proper technique of therex with good motivation throughout session. PT will continue progression as able.    Personal Factors and Comorbidities  Age;Time since onset of injury/illness/exacerbation;Comorbidity 1;Comorbidity 2;Fitness;Past/Current Experience;Education    Comorbidities  OA, HTN, osteoporosis    Examination-Activity Limitations  Lift;Stairs;Squat;Transfers;Stand    Examination-Participation Restrictions  Church;Cleaning;Community Activity;Meal Prep;Yard Work    [employer]chant [employer] officer  Evolving/Moderate complexity    Clinical  Decision Making  Moderate    Rehab Potential  Good    PT Frequency  2x / week    PT Duration  8 weeks    PT Treatment/Interventions  Cryotherapy;Moist Heat;Ultrasound;Gait training;Stair training;Functional mobility  training;Therapeutic activities;Therapeutic exercise;Balance training;Neuromuscular re-education;Patient/family education;Orthotic Fit/Training;Manual techniques;Scar mobilization;Dry needling;Energy conservation;Electrical Stimulation;Canalith Repostioning;Vestibular    PT Next Visit Plan  Increase mobility, assess repeated motion    PT Home Exercise Plan  lumbar ext, prone prop, piriformis stretch    Consulted and Agree with Plan of Care  Patient       Patient will benefit from skilled therapeutic intervention in order to improve the following deficits and impairments:  Abnormal gait, Decreased endurance, Impaired sensation, Decreased activity tolerance, Decreased strength, Pain, Decreased balance, Decreased mobility, Difficulty walking, Improper body mechanics, Decreased range of motion, Decreased coordination, Postural dysfunction, Impaired flexibility  Visit Diagnosis: Difficulty in walking, not elsewhere classified  Muscle weakness (generalized)  Chronic bilateral low back pain without sciatica  Dizziness and giddiness     Problem List Patient Active Problem List   Diagnosis Date Noted  . Allergic state 03/14/2017  . Arthritis 03/14/2017  . Hypertension 03/14/2017  . Osteoporosis, post-menopausal 03/14/2017  . Benign paroxysmal positional vertigo due to bilateral vestibular disorder 06/30/2015   Shelton Silvas PT, DPT Shelton Silvas 06/13/2019, 11:13 AM  [place] [facility] [address], [place], [address] Phone: [phone]   Fax:  [phone]  Name: Jaclyn Hall MRN: [id number] Date of Birth: 18-Feb-1945

## 2019-06-19 ENCOUNTER — Encounter: Payer: Self-pay | Admitting: Physical Therapy

## 2019-06-19 ENCOUNTER — Ambulatory Visit: Payer: Medicare HMO | Attending: Sports Medicine | Admitting: Physical Therapy

## 2019-06-19 ENCOUNTER — Other Ambulatory Visit: Payer: Self-pay

## 2019-06-19 DIAGNOSIS — G8929 Other chronic pain: Secondary | ICD-10-CM | POA: Diagnosis present

## 2019-06-19 DIAGNOSIS — R262 Difficulty in walking, not elsewhere classified: Secondary | ICD-10-CM | POA: Insufficient documentation

## 2019-06-19 DIAGNOSIS — R42 Dizziness and giddiness: Secondary | ICD-10-CM | POA: Insufficient documentation

## 2019-06-19 DIAGNOSIS — M545 Low back pain, unspecified: Secondary | ICD-10-CM

## 2019-06-19 DIAGNOSIS — M6281 Muscle weakness (generalized): Secondary | ICD-10-CM | POA: Insufficient documentation

## 2019-06-19 NOTE — Therapy (Signed)
[place] [facility] [address], [place], [address] Phone: [phone]   Fax:  [phone]  Physical Therapy Treatment  Patient Details  Name: Jaclyn Hall MRN: [id number] Date of Birth: 09-12-1945 No data recorded  Encounter Date: 06/19/2019  PT End of Session - 06/19/19 1039    Visit Number  7    Number of Visits  17    Date for PT Re-Evaluation  07/19/19    PT Start Time  1030    PT Stop Time  1110    PT Time Calculation (min)  40 min    Activity Tolerance  Patient tolerated treatment well    Behavior During Therapy  [facility] for tasks assessed/performed       Past Medical History:  Diagnosis Date  . Arthritis   . Asthma   . Colon polyps   . Hypertension   . Osteoporosis   . Vertigo     Past Surgical History:  Procedure Laterality Date  . ABDOMINAL HYSTERECTOMY    . CHOLECYSTECTOMY    . COLONOSCOPY    . COLONOSCOPY N/A 11/22/2014   Procedure: COLONOSCOPY;  Surgeon: Lollie Sails, MD;  Location: [facility] ENDOSCOPY;  Service: Endoscopy;  Laterality: N/A;  . OOPHORECTOMY      There were no vitals filed for this visit.  Subjective Assessment - 06/19/19 1034    Subjective  Reports she did fairly well this weekend, with some decreased pain overall. L knee pain is 4/10, with no LBP this am. Compliance with HEP.    Pertinent History  Patient is a 74 year female presenting with LBP and bilat knee pain. Pertinent history of T4/5 laminectomy Nov 2019. Patient reports LBP over the past 3 weeks with insideous onset. Reports LBP radiates into bilat buttock, reporting this pain is sore and dull/achy. Pain is aggravated by prolonged sitting (does report this is more d/t LE stiffness and neuropathy), but reports that pain comes on randomly, though she thinks pain is worse at the end of the day. She cannot pinpoint any aggravating activies, but does report when it comes on she is unable to complete any household tasks.  Worst pain in past week 10/10 best: 0/10. She is retired, but likes walk often outside about 78mins, and cook. Uses a quadcane that she has used since spinal surgery. Patient lives with husband and son next door with 4 steps to enter home with handrail, tub/shower. Denies falls in past 46months. Pt denies N/V, B&B changes, unexplained weight fluctuation, saddle paresthesia, fever, night sweats, or unrelenting night pain at this time.    Limitations  Lifting;House hold activities;Walking;Standing    How long can you sit comfortably?  77min    How long can you stand comfortably?  47min    How long can you walk comfortably?  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)    Pain Onset  More than a month ago    Pain Onset  1 to 4 weeks ago       Ther-Ex Nustep L2 seat setting 7 UE setting 158minsSPM above 60 for gentle motion and strengthening SKTC x30sec bilat reports this "feels good" Hooklying butterfly stretch x21min hold Dead bug position core contraction x6 Alt dead bug LEs only 3x September 19, 2004 with demo and heavy cuing initially with good carry  over following  Mini squatwith GTB at knees 3x 10 with cuing to "pop up" from stand for quick stand with good carry over SLS trials R: 3sec  L: 4sec; Tandem L 25sec R 25sec;  Narrow BOS: 30sec; on foam: 30sec; on foam eyes closed 11sec Palloff press in mini squat 5# 3x 10 each side with demo and max cuing initially for proper technique with good carry over                          PT Education - 06/19/19 1036    Education Details  therex form/technique    Person(s) Educated  Patient    Methods  Explanation;Demonstration;Verbal cues    Comprehension  Verbalized understanding;Returned demonstration;Verbal cues required       PT Short Term Goals - 05/25/19 0756      PT SHORT TERM GOAL #1    Title  Pt will be independent with HEP in order to improve strength and decrease back pain in order to improve pain-free function at home and work.    Baseline  05/24/19 HEP given    Time  4    Period  Weeks    Status  New        PT Long Term Goals - 05/25/19 0757      PT LONG TERM GOAL #1   Title  Patient will increase FOTO score to 71 to demonstrate predicted increase in functional mobility to complete ADLs    Baseline  05/24/19 55    Time  8    Period  Weeks    Status  New      PT LONG TERM GOAL #2   Title  Pt will decrease worst back pain as reported on NPRS by at least 2 points in order to demonstrate clinically significant reduction in back pain.    Baseline  05/24/19 10/10    Time  8    Period  Weeks    Status  New      PT LONG TERM GOAL #3   Title  Pt will decrease 5TSTS by at least 3 seconds in order to demonstrate clinically significant improvement in LE strength    Baseline  05/24/19 15sec    Time  8    Period  Weeks    Status  New      PT LONG TERM GOAL #4   Title  Patient will demonstrate bilat SLS of 11sec or more to demonstrate age-matched norms of reduced fall risk    Baseline  05/24/19 R 2sec L 3sec    Time  8    Period  Weeks    Status  New            Plan - 06/19/19 1054    Clinical Impression Statement  PT continued therex progression for increased hip/core strength and balance with good success. Patient is able to comply with all cuing for proper tecnique of therex and is motivated throughout session, without increased pain, good noted muscle fatigue. PT encouraged patient to practice SLS and tandem stance balance at home, with verbalized understanding. PT will continue progression able.    Personal Factors and Comorbidities  Age;Time since onset of injury/illness/exacerbation;Comorbidity 1;Comorbidity 2;Fitness;Past/Current Experience;Education    Comorbidities  OA, HTN, osteoporosis    Examination-Activity Limitations  Lift;Stairs;Squat;Transfers;Stand     Examination-Participation Restrictions  Church;Cleaning;Community Activity;Meal Prep;Yard Work    Public affairs consultant  Moderate    Rehab Potential  Good    PT Frequency  2x / week    PT Duration  8 weeks    PT Treatment/Interventions  Cryotherapy;Moist Heat;Ultrasound;Gait training;Stair training;Functional mobility training;Therapeutic activities;Therapeutic exercise;Balance training;Neuromuscular re-education;Patient/family education;Orthotic Fit/Training;Manual techniques;Scar mobilization;Dry needling;Energy conservation;Electrical Stimulation;Canalith Repostioning;Vestibular    PT Next Visit Plan  Increase mobility, assess repeated motion    PT Home Exercise Plan  lumbar ext, prone prop, piriformis stretch    Consulted and Agree with Plan of Care  Patient       Patient will benefit from skilled therapeutic intervention in order to improve the following deficits and impairments:  Abnormal gait, Decreased endurance, Impaired sensation, Decreased activity tolerance, Decreased strength, Pain, Decreased balance, Decreased mobility, Difficulty walking, Improper body mechanics, Decreased range of motion, Decreased coordination, Postural dysfunction, Impaired flexibility  Visit Diagnosis: Difficulty in walking, not elsewhere classified  Muscle weakness (generalized)  Chronic bilateral low back pain without sciatica     Problem List Patient Active Problem List   Diagnosis Date Noted  . Allergic state 03/14/2017  . Arthritis 03/14/2017  . Hypertension 03/14/2017  . Osteoporosis, post-menopausal 03/14/2017  . Benign paroxysmal positional vertigo due to bilateral vestibular disorder 06/30/2015   Shelton Silvas PT, DPT Shelton Silvas 06/19/2019, 11:10 AM  [place] [facility] [address], [place], [address] Phone: [phone]   Fax:   [phone]  Name: Jaclyn Hall MRN: [id number] Date of Birth: 11-13-1945

## 2019-06-27 ENCOUNTER — Encounter: Payer: Self-pay | Admitting: Physical Therapy

## 2019-06-27 ENCOUNTER — Ambulatory Visit: Payer: Medicare HMO | Admitting: Physical Therapy

## 2019-06-27 ENCOUNTER — Other Ambulatory Visit: Payer: Self-pay

## 2019-06-27 DIAGNOSIS — G8929 Other chronic pain: Secondary | ICD-10-CM

## 2019-06-27 DIAGNOSIS — R262 Difficulty in walking, not elsewhere classified: Secondary | ICD-10-CM

## 2019-06-27 DIAGNOSIS — M6281 Muscle weakness (generalized): Secondary | ICD-10-CM

## 2019-06-27 NOTE — Therapy (Signed)
[place] [facility] [address] [place], [address] Phone: [phone]   Fax:  [phone]  Physical Therapy Treatment  Patient Details  Name: Jaclyn Hall MRN: [id number] Date of Birth: June 23, 1945 No data recorded  Encounter Date: 06/27/2019  PT End of Session - 06/27/19 1115    Visit Number  8    Number of Visits  17    Date for PT Re-Evaluation  07/19/19    PT Start Time  1113    PT Stop Time  1153    PT Time Calculation (min)  40 min    Activity Tolerance  Patient tolerated treatment well    Behavior During Therapy  [facility] for tasks assessed/performed       Past Medical History:  Diagnosis Date  . Arthritis   . Asthma   . Colon polyps   . Hypertension   . Osteoporosis   . Vertigo     Past Surgical History:  Procedure Laterality Date  . ABDOMINAL HYSTERECTOMY    . CHOLECYSTECTOMY    . COLONOSCOPY    . COLONOSCOPY N/A 11/22/2014   Procedure: COLONOSCOPY;  Surgeon: Lollie Sails, MD;  Location: [facility] ENDOSCOPY;  Service: Endoscopy;  Laterality: N/A;  . OOPHORECTOMY      There were no vitals filed for this visit.  Subjective Assessment - 06/27/19 1112    Subjective  Patient reports she kept her grandchildren this weekend and reports she had a good time. Continues to report LBP is better and has 4/10 L knee pain. Compliance with HEP.    Pertinent History  Patient is a 74 year female presenting with LBP and bilat knee pain. Pertinent history of T4/5 laminectomy Nov 2019. Patient reports LBP over the past 3 weeks with insideous onset. Reports LBP radiates into bilat buttock, reporting this pain is sore and dull/achy. Pain is aggravated by prolonged sitting (does report this is more d/t LE stiffness and neuropathy), but reports that pain comes on randomly, though she thinks pain is worse at the end of the day. She cannot pinpoint any aggravating activies, but does report when it comes on she is unable to  complete any household tasks. Worst pain in past week 10/10 best: 0/10. She is retired, but likes walk often outside about 68mins, and cook. Uses a quadcane that she has used since spinal surgery. Patient lives with husband and son next door with 4 steps to enter home with handrail, tub/shower. Denies falls in past 67months. Pt denies N/V, B&B changes, unexplained weight fluctuation, saddle paresthesia, fever, night sweats, or unrelenting night pain at this time.    Limitations  Lifting;House hold activities;Walking;Standing    How long can you sit comfortably?  63min    How long can you stand comfortably?  104min    How long can you walk comfortably?  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)    Pain Onset  More than a month ago    Pain Onset  1 to 4 weeks ago       Ther-Ex Nustep L2 seat setting 7 UE setting 130minsSPM above 60 for gentle motion and strengthening SKTC x30sec bilat reports this "feels good" Hooklying butterfly stretch x62min hold Dead bug position core contraction with ball x6 Alt dead bug LEs only 3x July 17, 2006 with demo  and heavy cuing initially with good carry over following  Mini squatwith GTB at knees2x 10 with min cuing for set up technique with good carry over following Lateral step up onto 6in step 2x 8 each LE with UE support for balance Tandem walk 4x 40ft with modifications and minA occasionally to prevent LOB  Palloff press in mini squat 5# 3x 10 each side with cuing for set up with good carry over from last session                           PT Education - 06/27/19 1115    Education Details  therex form/technique    Person(s) Educated  Patient    Methods  Explanation;Demonstration;Verbal cues    Comprehension  Verbal cues required;Returned demonstration;Verbalized understanding       PT Short  Term Goals - 05/25/19 0756      PT SHORT TERM GOAL #1   Title  Pt will be independent with HEP in order to improve strength and decrease back pain in order to improve pain-free function at home and work.    Baseline  05/24/19 HEP given    Time  4    Period  Weeks    Status  New        PT Long Term Goals - 05/25/19 0757      PT LONG TERM GOAL #1   Title  Patient will increase FOTO score to 71 to demonstrate predicted increase in functional mobility to complete ADLs    Baseline  05/24/19 55    Time  8    Period  Weeks    Status  New      PT LONG TERM GOAL #2   Title  Pt will decrease worst back pain as reported on NPRS by at least 2 points in order to demonstrate clinically significant reduction in back pain.    Baseline  05/24/19 10/10    Time  8    Period  Weeks    Status  New      PT LONG TERM GOAL #3   Title  Pt will decrease 5TSTS by at least 3 seconds in order to demonstrate clinically significant improvement in LE strength    Baseline  05/24/19 15sec    Time  8    Period  Weeks    Status  New      PT LONG TERM GOAL #4   Title  Patient will demonstrate bilat SLS of 11sec or more to demonstrate age-matched norms of reduced fall risk    Baseline  05/24/19 R 2sec L 3sec    Time  8    Period  Weeks    Status  New            Plan - 06/27/19 1126    Clinical Impression Statement  PT continued therex progression for increased hip/core strength and dynamic balance with good success. Patient is able to comply with all cuing for proper technique of therex progression with good motivation; no increased pain. Patient reports she can tell she is making strong progress and is no longer using her cane at home. PT will continue progression as able.    Personal Factors and Comorbidities  Age;Time since onset of injury/illness/exacerbation;Comorbidity 1;Comorbidity 2;Fitness;Past/Current Experience;Education    Comorbidities  OA, HTN, osteoporosis    Examination-Activity Limitations   Lift;Stairs;Squat;Transfers;Stand    Examination-Participation Restrictions  Church;Cleaning;Community Activity;Meal Prep;Yard Work    Merchant navy officer  Evolving/Moderate complexity    Clinical Decision Making  Moderate    Rehab Potential  Good    PT Frequency  2x / week    PT Duration  8 weeks    PT Treatment/Interventions  Cryotherapy;Moist Heat;Ultrasound;Gait training;Stair training;Functional mobility training;Therapeutic activities;Therapeutic exercise;Balance training;Neuromuscular re-education;Patient/family education;Orthotic Fit/Training;Manual techniques;Scar mobilization;Dry needling;Energy conservation;Electrical Stimulation;Canalith Repostioning;Vestibular    PT Next Visit Plan  Increase mobility, assess repeated motion    PT Home Exercise Plan  lumbar ext, prone prop, piriformis stretch    Consulted and Agree with Plan of Care  Patient       Patient will benefit from skilled therapeutic intervention in order to improve the following deficits and impairments:  Abnormal gait, Decreased endurance, Impaired sensation, Decreased activity tolerance, Decreased strength, Pain, Decreased balance, Decreased mobility, Difficulty walking, Improper body mechanics, Decreased range of motion, Decreased coordination, Postural dysfunction, Impaired flexibility  Visit Diagnosis: Difficulty in walking, not elsewhere classified  Muscle weakness (generalized)  Chronic bilateral low back pain without sciatica     Problem List Patient Active Problem List   Diagnosis Date Noted  . Allergic state 03/14/2017  . Arthritis 03/14/2017  . Hypertension 03/14/2017  . Osteoporosis, post-menopausal 03/14/2017  . Benign paroxysmal positional vertigo due to bilateral vestibular disorder 06/30/2015   Durwin Reges DPT Durwin Reges 06/27/2019, 11:54 AM  [place] [facility] [address], [place], [address] Phone:  [phone]   Fax:  [phone]  Name: Jaclyn Hall MRN: [id number] Date of Birth: March 10, 1945

## 2019-06-29 ENCOUNTER — Ambulatory Visit: Payer: Medicare HMO | Admitting: Physical Therapy

## 2019-06-29 ENCOUNTER — Encounter: Payer: Self-pay | Admitting: Physical Therapy

## 2019-06-29 ENCOUNTER — Other Ambulatory Visit: Payer: Self-pay

## 2019-06-29 DIAGNOSIS — G8929 Other chronic pain: Secondary | ICD-10-CM

## 2019-06-29 DIAGNOSIS — R262 Difficulty in walking, not elsewhere classified: Secondary | ICD-10-CM | POA: Diagnosis not present

## 2019-06-29 DIAGNOSIS — R42 Dizziness and giddiness: Secondary | ICD-10-CM

## 2019-06-29 DIAGNOSIS — M6281 Muscle weakness (generalized): Secondary | ICD-10-CM

## 2019-06-29 NOTE — Therapy (Signed)
[place] [facility] [address] [place], [address] Phone: [phone]   Fax:  [phone]  Physical Therapy Treatment  Patient Details  Name: Jaclyn Hall MRN: [id number] Date of Birth: Jan 17, 1946 No data recorded  Encounter Date: 06/29/2019   PT End of Session - 06/29/19 0923    Visit Number 9    Number of Visits 17    Date for PT Re-Evaluation 07/19/19    PT Start Time 0920    PT Stop Time 1000    PT Time Calculation (min) 40 min    Equipment Utilized During Treatment Gait belt    Activity Tolerance Patient tolerated treatment well    Behavior During Therapy WFL for tasks assessed/performed           Past Medical History:  Diagnosis Date   Arthritis    Asthma    Colon polyps    Hypertension    Osteoporosis    Vertigo     Past Surgical History:  Procedure Laterality Date   ABDOMINAL HYSTERECTOMY     CHOLECYSTECTOMY     COLONOSCOPY     COLONOSCOPY N/A 11/22/2014   Procedure: COLONOSCOPY;  Surgeon: Lollie Sails, MD;  Location: [facility] ENDOSCOPY;  Service: Endoscopy;  Laterality: N/A;   OOPHORECTOMY      There were no vitals filed for this visit.   Subjective Assessment - 06/29/19 0921    Subjective Patient reports her L knee is bothering her a little bit today. Overall doing well. No LBP, compliance with HEP.    Pertinent History Patient is a 74 year female presenting with LBP and bilat knee pain. Pertinent history of T4/5 laminectomy Nov 2019. Patient reports LBP over the past 3 weeks with insideous onset. Reports LBP radiates into bilat buttock, reporting this pain is sore and dull/achy. Pain is aggravated by prolonged sitting (does report this is more d/t LE stiffness and neuropathy), but reports that pain comes on randomly, though she thinks pain is worse at the end of the day. She cannot pinpoint any aggravating activies, but does report when it comes on she is unable to complete  any household tasks. Worst pain in past week 10/10 best: 0/10. She is retired, but likes walk often outside about 22mins, and cook. Uses a quadcane that she has used since spinal surgery. Patient lives with husband and son next door with 4 steps to enter home with handrail, tub/shower. Denies falls in past 56months. Pt denies N/V, B&B changes, unexplained weight fluctuation, saddle paresthesia, fever, night sweats, or unrelenting night pain at this time.    Limitations Lifting;House hold activities;Walking;Standing    How long can you sit comfortably? 86min    How long can you stand comfortably? 72min    How long can you walk comfortably? 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)    Pain Onset More than a month ago    Pain Onset 1 to 4 weeks ago           Ther-Ex Nustep L3 seat setting 7 UE setting 120minsSPM above 60 for gentle motion and strengthening SKTC x30sec bilat reports this "feels good" Hooklying butterfly stretch x72min hold Seated on theraball oblique twist 3x 10 with good carry over of cuing for technique Seated on theraball alt hip flex 3x 10 Mini squatwith GTB  at knees5# x8; 10# 2x 6 with demo  heavy cuing prior to completing d/t difficulty with maintaining proper squat technique with UE in neutral, good carry over following Tandem walk 4x 82ft with modifications and minA occasionally to prevent LOB  Palloff press in mini squat 5#3x 10 each side with cuing for set up with good carry over from last session                          PT Education - 06/29/19 0923    Education Details therex form/technique    Person(s) Educated Patient    Methods Explanation;Demonstration;Verbal cues    Comprehension Verbalized understanding;Returned demonstration;Verbal cues required            PT Short Term Goals - 05/25/19  0756      PT SHORT TERM GOAL #1   Title Pt will be independent with HEP in order to improve strength and decrease back pain in order to improve pain-free function at home and work.    Baseline 05/24/19 HEP given    Time 4    Period Weeks    Status New             PT Long Term Goals - 05/25/19 0757      PT LONG TERM GOAL #1   Title Patient will increase FOTO score to 71 to demonstrate predicted increase in functional mobility to complete ADLs    Baseline 05/24/19 55    Time 8    Period Weeks    Status New      PT LONG TERM GOAL #2   Title Pt will decrease worst back pain as reported on NPRS by at least 2 points in order to demonstrate clinically significant reduction in back pain.    Baseline 05/24/19 10/10    Time 8    Period Weeks    Status New      PT LONG TERM GOAL #3   Title Pt will decrease 5TSTS by at least 3 seconds in order to demonstrate clinically significant improvement in LE strength    Baseline 05/24/19 15sec    Time 8    Period Weeks    Status New      PT LONG TERM GOAL #4   Title Patient will demonstrate bilat SLS of 11sec or more to demonstrate age-matched norms of reduced fall risk    Baseline 05/24/19 R 2sec L 3sec    Time 8    Period Weeks    Status New                 Plan - 06/29/19 0934    Clinical Impression Statement PT continued therex progression for increased hip/core strength and dynamic balance with success. Patient is able to comply with all cuing for proper technique of therex, and no increased pain with progression, good muscle fatigue. PT will assess d/c readiness next session.    Personal Factors and Comorbidities Age;Time since onset of injury/illness/exacerbation;Comorbidity 1;Comorbidity 2;Fitness;Past/Current Experience;Education    Comorbidities OA, HTN, osteoporosis    Examination-Activity Limitations Lift;Stairs;Squat;Transfers;Stand    Examination-Participation Restrictions Church;Cleaning;Community Activity;Meal Prep;Yard Work     Merchant navy officer Evolving/Moderate complexity    Clinical Decision Making Moderate    Rehab Potential Good    PT Frequency 2x / week    PT Duration 8 weeks    PT Treatment/Interventions Cryotherapy;Moist Heat;Ultrasound;Gait training;Stair training;Functional mobility training;Therapeutic activities;Therapeutic exercise;Balance training;Neuromuscular re-education;Patient/family education;Orthotic Fit/Training;Manual techniques;Scar mobilization;Dry needling;Energy conservation;Electrical Stimulation;Canalith  Repostioning;Vestibular    PT Next Visit Plan Increase mobility, assess repeated motion    PT Home Exercise Plan lumbar ext, prone prop, piriformis stretch    Consulted and Agree with Plan of Care Patient           Patient will benefit from skilled therapeutic intervention in order to improve the following deficits and impairments:  Abnormal gait, Decreased endurance, Impaired sensation, Decreased activity tolerance, Decreased strength, Pain, Decreased balance, Decreased mobility, Difficulty walking, Improper body mechanics, Decreased range of motion, Decreased coordination, Postural dysfunction, Impaired flexibility  Visit Diagnosis: Difficulty in walking, not elsewhere classified  Muscle weakness (generalized)  Chronic bilateral low back pain without sciatica  Dizziness and giddiness     Problem List Patient Active Problem List   Diagnosis Date Noted   Allergic state 03/14/2017   Arthritis 03/14/2017   Hypertension 03/14/2017   Osteoporosis, post-menopausal 03/14/2017   Benign paroxysmal positional vertigo due to bilateral vestibular disorder 06/30/2015   Durwin Reges DPT Durwin Reges 06/29/2019, 9:54 AM  [place] [facility] [address], [place], [address] Phone: [phone]   Fax:  [phone]  Name: Jaclyn Hall MRN: [id number] Date of Birth: 05-May-1945

## 2019-07-03 ENCOUNTER — Ambulatory Visit: Payer: Medicare HMO | Admitting: Physical Therapy

## 2019-07-03 ENCOUNTER — Other Ambulatory Visit: Payer: Self-pay

## 2019-07-03 ENCOUNTER — Encounter: Payer: Self-pay | Admitting: Physical Therapy

## 2019-07-03 DIAGNOSIS — R262 Difficulty in walking, not elsewhere classified: Secondary | ICD-10-CM | POA: Diagnosis not present

## 2019-07-03 DIAGNOSIS — G8929 Other chronic pain: Secondary | ICD-10-CM

## 2019-07-03 DIAGNOSIS — M6281 Muscle weakness (generalized): Secondary | ICD-10-CM

## 2019-07-03 NOTE — Therapy (Signed)
[facility] [address] [place], [address] Phone: [phone]   Fax:  [phone]  Physical Therapy Treatment  Patient Details  Name: Jaclyn Hall MRN: [id number] Date of Birth: [birth year] No data recorded  Encounter Date: 07/03/2019   PT End of Session - 07/03/19 1043    Visit Number 10    Number of Visits 17    Date for PT Re-Evaluation 07/19/19    PT Start Time 1037    PT Stop Time 1115    PT Time Calculation (min) 38 min    Equipment Utilized During Treatment Gait belt    Activity Tolerance Patient tolerated treatment well    Behavior During Therapy WFL for tasks assessed/performed           Past Medical History:  Diagnosis Date  . Arthritis   . Asthma   . Colon polyps   . Hypertension   . Osteoporosis   . Vertigo     Past Surgical History:  Procedure Laterality Date  . ABDOMINAL HYSTERECTOMY    . CHOLECYSTECTOMY    . COLONOSCOPY    . COLONOSCOPY N/A 11/22/2014   Procedure: COLONOSCOPY;  Surgeon: Lollie Sails, MD;  Location: [facility] ENDOSCOPY;  Service: Endoscopy;  Laterality: N/A;  . OOPHORECTOMY      There were no vitals filed for this visit.   Subjective Assessment - 07/03/19 1040    Subjective Patient reports she is feeling good overall, L knee is continuing to bother her less and less. She reports she feels like she is ready to d/c PT to HEP for strength maintenance.    Pertinent History Patient is a 74 year female presenting with LBP and bilat knee pain. Pertinent history of T4/5 laminectomy Nov 2019. Patient reports LBP over the past 3 weeks with insideous onset. Reports LBP radiates into bilat buttock, reporting this pain is sore and dull/achy. Pain is aggravated by prolonged sitting (does report this is more d/t LE stiffness and neuropathy), but reports that pain comes on randomly, though she thinks pain is worse at the end of the day. She cannot pinpoint any aggravating  activies, but does report when it comes on she is unable to complete any household tasks. Worst pain in past week 10/10 best: 0/10. She is retired, but likes walk often outside about 3mns, and cook. Uses a quadcane that she has used since spinal surgery. Patient lives with husband and son next door with 4 steps to enter home with handrail, tub/shower. Denies falls in past 683month Pt denies N/V, B&B changes, unexplained weight fluctuation, saddle paresthesia, fever, night sweats, or unrelenting night pain at this time.    Limitations Lifting;House hold activities;Walking;Standing    How long can you sit comfortably? 6023m   How long can you stand comfortably? 40m41m  How long can you walk comfortably? 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 PT educated  patient on parameters of therex (how/when to inc/decrease intensity, frequency, rep/set range, stretch hold time, and purpose of therex) with verbalized understanding.  Access Code: [email or web address] Exercises Hooklying Single Knee to Chest Stretch - 2 x daily - 7 x weekly - 30-60sec hold Supine Butterfly Groin Stretch - 2 x daily - 7 x weekly - 30-60sec hold Supine Dead Bug with Leg Extension - 1 x daily - 2-3 x weekly - 3 sets - 6-10 reps Supine Bridge with Resistance Band - 1 x daily - 2-3 x weekly - 3 sets - 10 reps Prone Alternating Arm and Leg Lifts - 1 x daily - 2-3 x weekly - 3 sets - 10 reps Squat with Chair Touch and Resistance Loop - 1 x daily - 2-3 x weekly - 3 sets - 10 reps Standing Hip  Abduction Kicks - 1 x daily - 2-3 x weekly - 3 sets - 10 reps Single Leg Stance with Support - 3 x daily - 7 x weekly - 5-10sec hold                            PT Education - 07/03/19 1042    Education Details d/c and HEP recommendations    Person(s) Educated Patient    Methods Explanation;Demonstration;Verbal cues;Handout    Comprehension Verbalized understanding;Returned demonstration;Verbal cues required            PT Short Term Goals - 07/03/19 1043      PT SHORT TERM GOAL #1   Title Pt will be independent with HEP in order to improve strength and decrease back pain in order to improve pain-free function at home and work.    Baseline 05/24/19 HEP given; 07/03/19    Time 4    Period Weeks    Status New             PT Long Term Goals - 07/03/19 1044      PT LONG TERM GOAL #1   Title Patient will increase FOTO score to 71 to demonstrate predicted increase in functional mobility to complete ADLs    Baseline 05/24/19 70    Time 8    Period Weeks    Status Achieved      PT LONG TERM GOAL #2   Title Pt will decrease worst back pain as reported on NPRS by at least 2 points in order to demonstrate clinically significant reduction in back pain.    Baseline 05/24/19 10/10 07/03/19 2/10 LBP on occassion    Time 8    Period Weeks    Status Achieved      PT LONG TERM GOAL #3   Title Pt will decrease 5TSTS by at least 3 seconds in order to demonstrate clinically significant improvement in LE strength    Baseline 05/24/19 15sec 07/03/19 11sec    Time 8    Period Weeks    Status Achieved      PT LONG TERM GOAL #4   Title Patient will demonstrate bilat SLS of 11sec or more to demonstrate age-matched norms of reduced fall risk    Baseline 05/24/19 R 2sec L 3sec; 6/15 R: 4sec L: 5sec    Time 8    Period Weeks    Status Partially Met                 Plan - 07/03/19 1256    Clinical Impression Statement PT re-assessed goals this session, where patient has  met  all goals, except static balance, which she has made good progress towards. At this time, patient is able to safely complete HEP independently for continued maintenance and progress toward goals. Pt given clinic contact info should any further questions or concerns arise. PT to d/c PT    Personal Factors and Comorbidities Age;Time since onset of injury/illness/exacerbation;Comorbidity 1;Comorbidity 2;Fitness;Past/Current Experience;Education    Comorbidities OA, HTN, osteoporosis    Examination-Activity Limitations Lift;Stairs;Squat;Transfers;Stand    Examination-Participation Restrictions Church;Cleaning;Community Activity;Meal Prep;Yard Work    Merchant [employer] officer Evolving/Moderate complexity    Clinical Decision Making Moderate    Rehab Potential Good    PT Frequency 2x / week    PT Duration 8 weeks    PT Treatment/Interventions Cryotherapy;Moist Heat;Ultrasound;Gait training;Stair training;Functional mobility training;Therapeutic activities;Therapeutic exercise;Balance training;Neuromuscular re-education;Patient/family education;Orthotic Fit/Training;Manual techniques;Scar mobilization;Dry needling;Energy conservation;Electrical Stimulation;Canalith Repostioning;Vestibular    PT Next Visit Plan Increase mobility, assess repeated motion    PT Home Exercise Plan lumbar ext, prone prop, piriformis stretch    Consulted and Agree with Plan of Care Patient           Patient will benefit from skilled therapeutic intervention in order to improve the following deficits and impairments:  Abnormal gait, Decreased endurance, Impaired sensation, Decreased activity tolerance, Decreased strength, Pain, Decreased balance, Decreased mobility, Difficulty walking, Improper body mechanics, Decreased range of motion, Decreased coordination, Postural dysfunction, Impaired flexibility  Visit Diagnosis: Difficulty in walking, not elsewhere classified  Muscle weakness (generalized)  Chronic  bilateral low back pain without sciatica     Problem List Patient Active Problem List   Diagnosis Date Noted  . Allergic state 03/14/2017  . Arthritis 03/14/2017  . Hypertension 03/14/2017  . Osteoporosis, post-menopausal 03/14/2017  . Benign paroxysmal positional vertigo due to bilateral vestibular disorder 06/30/2015   Durwin Reges DPT  Durwin Reges 07/03/2019, 1:01 PM  [facility] [address], [place], [address] Phone: [phone]   Fax:  [phone]  Name: JEREMIAH TARPLEY MRN: [id number] Date of Birth: 02/18/1945

## 2019-07-05 ENCOUNTER — Ambulatory Visit: Payer: Medicare HMO | Admitting: Physical Therapy

## 2019-07-10 ENCOUNTER — Ambulatory Visit: Payer: Medicare HMO | Admitting: Physical Therapy

## 2019-07-12 ENCOUNTER — Encounter: Payer: Medicare HMO | Admitting: Physical Therapy

## 2019-07-16 ENCOUNTER — Encounter: Payer: Medicare HMO | Admitting: Physical Therapy

## 2019-07-18 ENCOUNTER — Encounter: Payer: Medicare HMO | Admitting: Physical Therapy

## 2019-07-27 ENCOUNTER — Other Ambulatory Visit: Payer: Self-pay | Admitting: Gastroenterology

## 2019-07-27 ENCOUNTER — Other Ambulatory Visit (HOSPITAL_COMMUNITY): Payer: Self-pay | Admitting: Gastroenterology

## 2019-07-27 DIAGNOSIS — R634 Abnormal weight loss: Secondary | ICD-10-CM

## 2019-08-11 ENCOUNTER — Ambulatory Visit: Payer: Medicare HMO

## 2019-11-13 ENCOUNTER — Other Ambulatory Visit: Payer: Self-pay | Admitting: Family Medicine

## 2019-11-13 DIAGNOSIS — Z1231 Encounter for screening mammogram for malignant neoplasm of breast: Secondary | ICD-10-CM

## 2020-04-08 IMAGING — MR MR LUMBAR SPINE W/O CM
8 of 11 series · 34 of 48 positions shown · non-contrast
Comparison: Abdominal CT 01/27/2007.

CLINICAL DATA: Interscapular pain since pulling a muscle last week.
Numbness, tingling and pain in the lower extremity since yesterday.

EXAM:
MRI THORACIC AND LUMBAR SPINE WITHOUT CONTRAST
TECHNIQUE: Multiplanar and multiecho pulse sequences of the thoracic and lumbar
spine were obtained without intravenous contrast.

[Series 21: T1 · sagittal · 5.0mm · 1.88mm/px · 2 of 9 slices shown (1 of 4)]
[im 1/9]
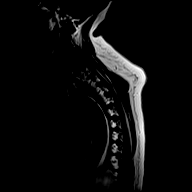
[im 9/9]
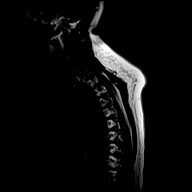

[Series 22: T2 · sagittal · 3.0mm · 1.06mm/px · 3 of 17 slices shown (1 of 4)]
[im 1/17]
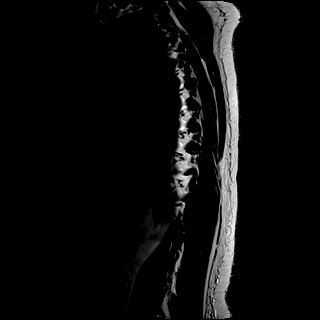
[im 9/17]
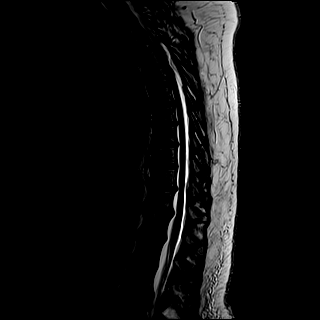
[im 17/17]
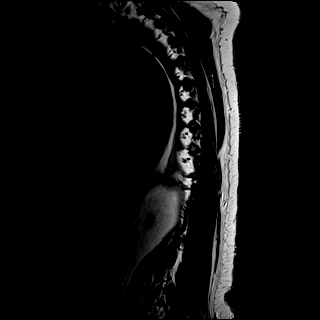

[Series 23: T1 · sagittal · 3.0mm · 1.06mm/px · 3 of 17 slices shown (2 of 4)]
[im 1/17]
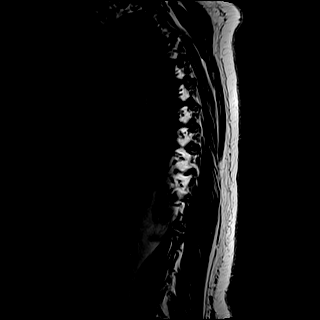
[im 9/17]
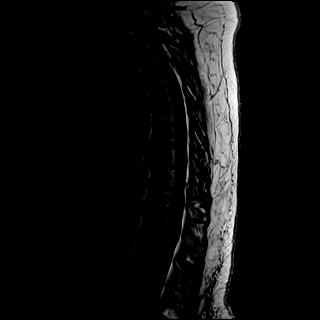
[im 17/17]
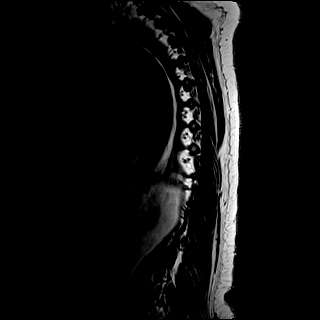

[Series 25: T2 · axial · 4.0mm · 0.59mm/px · z∈[-240,-53]mm · 7 of 39 slices shown (2 of 4)]
[im 1/39]
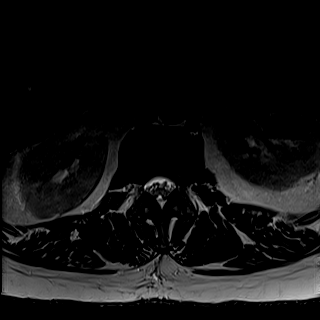
[im 7/39]
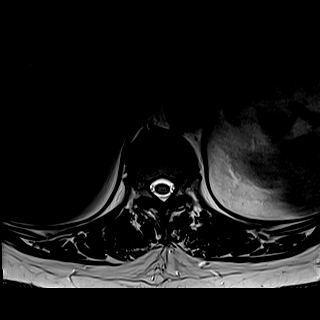
[im 13/39]
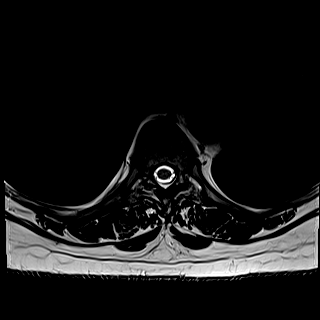
[im 20/39]
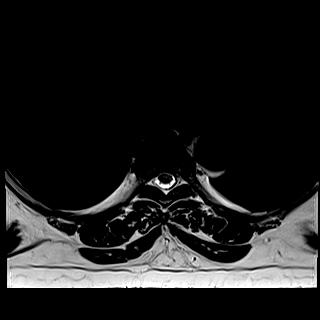
[im 26/39]
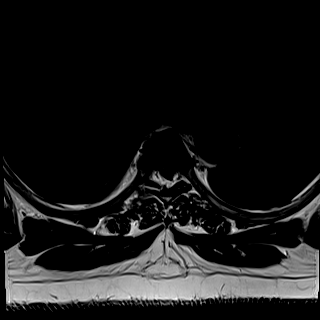
[im 32/39]
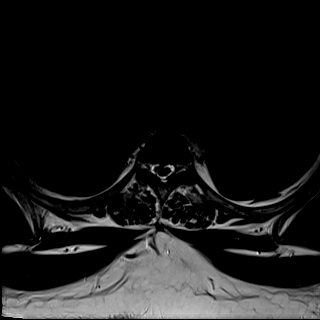
[im 39/39]
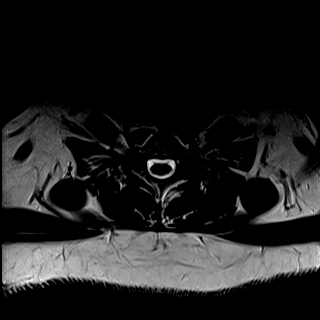

[Series 31: T2 · sagittal · 4.0mm · 0.81mm/px · 3 of 15 slices shown (3 of 4)]
[im 1/15]
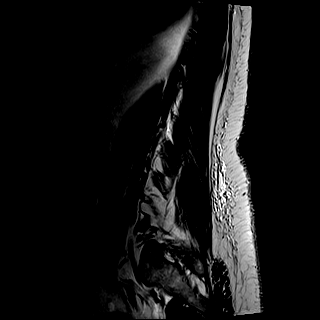
[im 8/15]
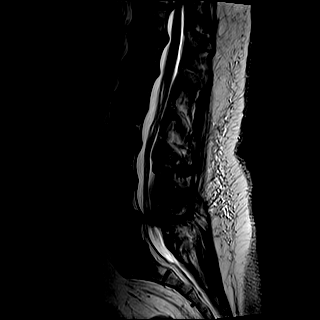
[im 15/15]
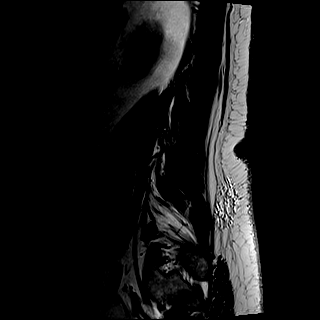

[Series 32: T1 · sagittal · 4.0mm · 0.81mm/px · 3 of 15 slices shown (3 of 4)]
[im 1/15]
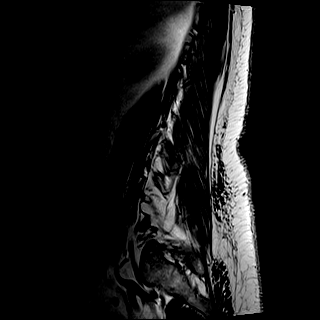
[im 8/15]
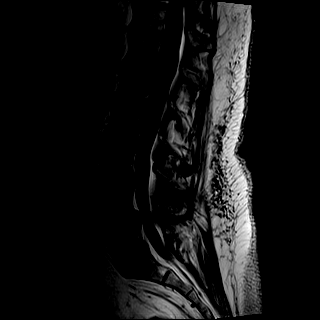
[im 15/15]
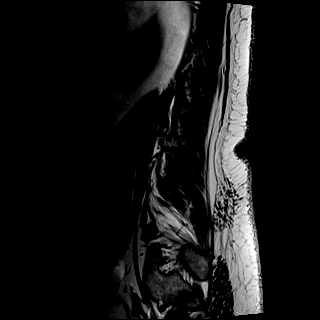

[Series 34: T2 · axial · 4.0mm · 0.78mm/px · z∈[-432,-240]mm · 7 of 36 slices shown (4 of 4)]
[im 1/36]
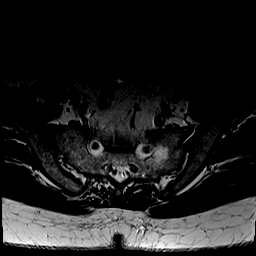
[im 6/36]
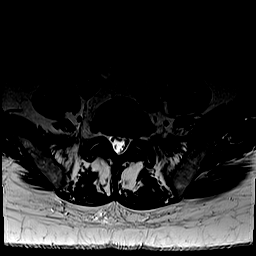
[im 12/36]
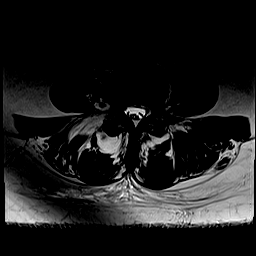
[im 18/36]
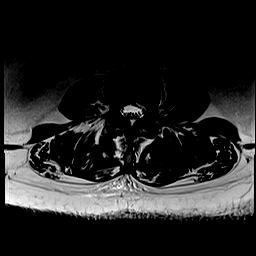
[im 24/36]
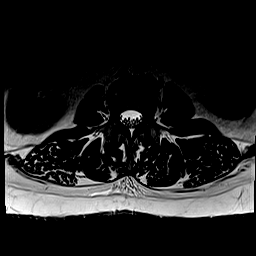
[im 30/36]
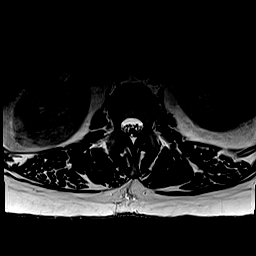
[im 36/36]
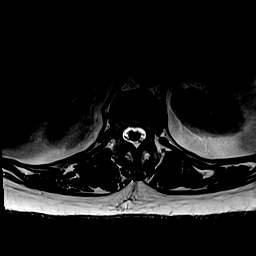

[Series 35: T1 · axial · 4.0mm · 0.39mm/px · z∈[-432,-269]mm · 6 of 36 slices shown (4 of 4)]
[im 1/36]
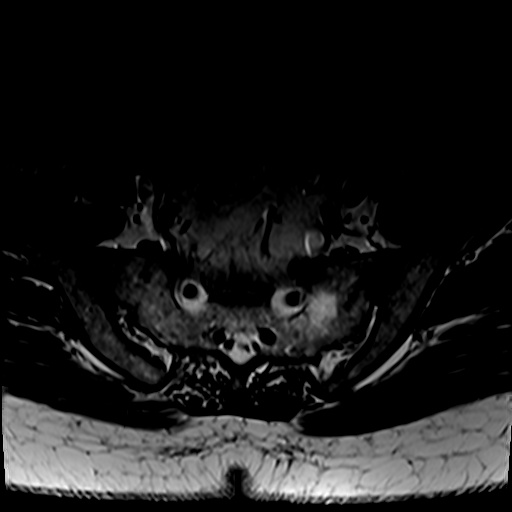
[im 6/36]
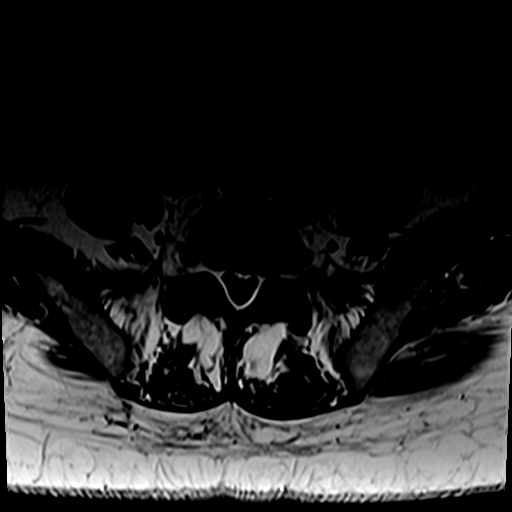
[im 12/36]
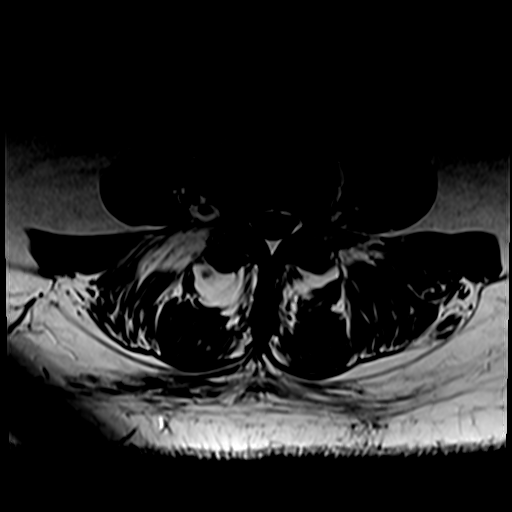
[im 18/36]
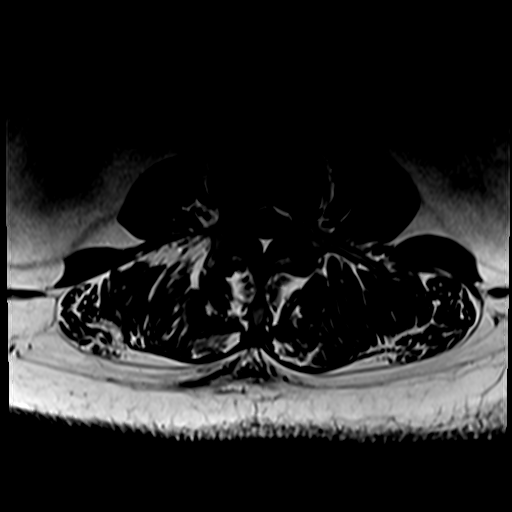
[im 24/36]
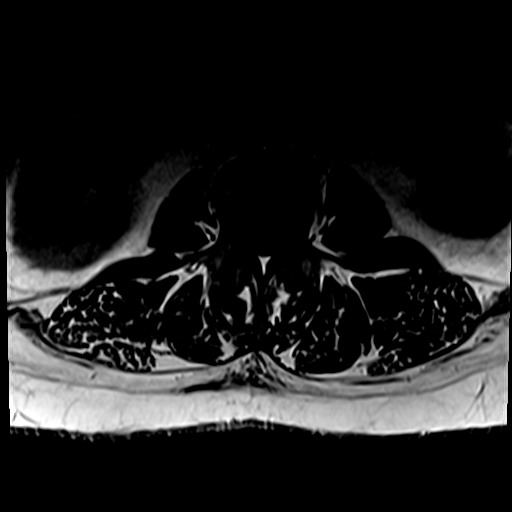
[im 30/36]
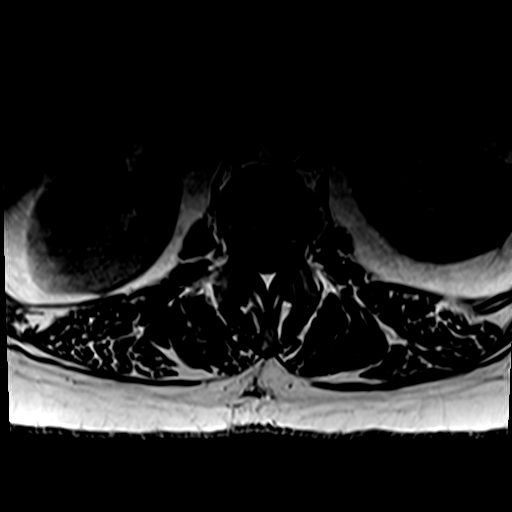

[34 of 48 positions shown; findings below may reference images not displayed]

FINDINGS: MRI THORACIC SPINE FINDINGS

Alignment:  Normal.

Vertebrae: No acute or suspicious osseous findings. There is
asymmetric fluid in the right T4-5 facet joint, best seen on the
sagittal inversion recovery images. No evidence of discitis or
osteomyelitis.

Cord: There is effacement of the CSF surrounding the cord at the T4
and T5 levels associated with mild cord flattening. There is no
abnormal cord signal.

Paraspinal and other soft tissues: There is prominent epidural fat
throughout the thoracic spine. This is most prominent posteriorly at
the T4 and T5 levels where there is mass effect on the thoracic
cord. Sagittal inversion recovery images demonstrate some increased
signal within this epidural fat, suspicious for inflammation related
to the right T4-5 facet joint. Inflammation may extend into the
posterior extrapleural fat on the right. There is no focal fluid
collection.

Disc levels:

As above, there is effacement of the CSF surrounding the cord at the
T4 and T5 levels due to a posterior epidural inflammatory process.
The discs at these levels appear normal. There is no foraminal
compromise. There is asymmetric fluid in the right T4-5 facet joint
with mild surrounding soft tissue inflammation.

There is mild disc bulging and scattered small central disc
protrusions throughout the thoracic spine without resulting cord
deformity or nerve root encroachment.

MRI LUMBAR SPINE FINDINGS

Segmentation: Based on numbering from the cervical spine, there are
5 lumbar type vertebral bodies.

Alignment: Mild S shaped scoliosis, convex to the right at L1-2 and
to the left at L4. 3 mm of degenerative anterolisthesis at L4-5.

Vertebrae: No acute osseous findings. There are mild endplate
degenerative changes at L2-3. No evidence of fracture or pars
defect.

Conus medullaris and cauda equina: Conus extends to the L1 level.
Conus and cauda equina appear normal.

Paraspinal and other soft tissues: Unremarkable.

Disc levels:

L1-2: Normal interspace.

L2-3: Mild loss of disc height with annular disc bulging asymmetric
to the left. Mild narrowing of the left lateral recess and left
foramen without nerve root encroachment.

L3-4: Relatively preserved disc height. Mild disc bulging, facet and
ligamentous hypertrophy. No significant spinal stenosis or nerve
root encroachment.

L4-5: Mild loss of disc height with annular disc bulging eccentric
to the right. There is moderate to severe facet and ligamentous
hypertrophy, accounting for the anterolisthesis. There is resulting
mild spinal stenosis with mild narrowing of the right lateral recess
and right foramen.

L5-S1: Disc height and hydration are maintained. Moderate bilateral
facet hypertrophy. No significant spinal stenosis or nerve root
encroachment.
IMPRESSION: 1. Inflammatory posterior epidural process at T4-5, likely related
to inflammation of the right T4-5 facet joint. This could be due to
infection or noninfectious inflammation. There is resulting mass
effect on upper thoracic cord which may contribute to the patient's
symptoms. Neuro surgical consultation recommended.
2. No evidence of drainable epidural abscess, discitis or
osteomyelitis.
3. No acute findings in the lumbar spine.
4. Mild multifactorial spinal stenosis at L4-5 with severe facet and
ligamentous hypertrophy.
5. These results were called by telephone at the time of
interpretation on 10/04/2017 at [DATE] to Dr. RELINDAS AUAD ,
who verbally acknowledged these results.

## 2020-11-10 ENCOUNTER — Encounter: Payer: Self-pay | Admitting: *Deleted

## 2020-11-11 ENCOUNTER — Ambulatory Visit
Admission: RE | Admit: 2020-11-11 | Discharge: 2020-11-11 | Disposition: A | Discharge status: Home / Self Care | Payer: Medicare HMO | Attending: Gastroenterology | Admitting: Gastroenterology

## 2020-11-11 ENCOUNTER — Encounter
Admission: RE | Disposition: A | Discharge status: Home / Self Care | Payer: Self-pay | Source: Home / Self Care | Attending: Gastroenterology

## 2020-11-11 ENCOUNTER — Ambulatory Visit: Payer: Medicare HMO | Admitting: Anesthesiology

## 2020-11-11 ENCOUNTER — Encounter: Payer: Self-pay | Admitting: *Deleted

## 2020-11-11 DIAGNOSIS — Z79899 Other long term (current) drug therapy: Secondary | ICD-10-CM | POA: Insufficient documentation

## 2020-11-11 DIAGNOSIS — Z791 Long term (current) use of non-steroidal anti-inflammatories (NSAID): Secondary | ICD-10-CM | POA: Insufficient documentation

## 2020-11-11 DIAGNOSIS — K219 Gastro-esophageal reflux disease without esophagitis: Secondary | ICD-10-CM | POA: Diagnosis not present

## 2020-11-11 DIAGNOSIS — I10 Essential (primary) hypertension: Secondary | ICD-10-CM | POA: Insufficient documentation

## 2020-11-11 DIAGNOSIS — D122 Benign neoplasm of ascending colon: Secondary | ICD-10-CM | POA: Insufficient documentation

## 2020-11-11 DIAGNOSIS — Z800 Family history of malignant neoplasm of digestive organs: Secondary | ICD-10-CM | POA: Insufficient documentation

## 2020-11-11 DIAGNOSIS — Z87891 Personal history of nicotine dependence: Secondary | ICD-10-CM | POA: Insufficient documentation

## 2020-11-11 DIAGNOSIS — K640 First degree hemorrhoids: Secondary | ICD-10-CM | POA: Diagnosis not present

## 2020-11-11 DIAGNOSIS — K635 Polyp of colon: Secondary | ICD-10-CM | POA: Insufficient documentation

## 2020-11-11 DIAGNOSIS — K5730 Diverticulosis of large intestine without perforation or abscess without bleeding: Secondary | ICD-10-CM | POA: Insufficient documentation

## 2020-11-11 DIAGNOSIS — Z7982 Long term (current) use of aspirin: Secondary | ICD-10-CM | POA: Insufficient documentation

## 2020-11-11 DIAGNOSIS — D120 Benign neoplasm of cecum: Secondary | ICD-10-CM | POA: Diagnosis not present

## 2020-11-11 DIAGNOSIS — Z1211 Encounter for screening for malignant neoplasm of colon: Secondary | ICD-10-CM | POA: Insufficient documentation

## 2020-11-11 DIAGNOSIS — Z9049 Acquired absence of other specified parts of digestive tract: Secondary | ICD-10-CM | POA: Insufficient documentation

## 2020-11-11 HISTORY — DX: Dyspnea, unspecified: R06.00

## 2020-11-11 HISTORY — PX: COLONOSCOPY WITH PROPOFOL: SHX5780

## 2020-11-11 SURGERY — COLONOSCOPY WITH PROPOFOL
Anesthesia: General

## 2020-11-11 MED ORDER — LIDOCAINE HCL (CARDIAC) PF 100 MG/5ML IV SOSY
PREFILLED_SYRINGE | INTRAVENOUS | Status: DC | PRN
  Administered 2020-11-11: 50 mg via INTRAVENOUS

## 2020-11-11 MED ORDER — SODIUM CHLORIDE 0.9 % IV SOLN: INTRAVENOUS | Status: DC

## 2020-11-11 MED ORDER — PROPOFOL 500 MG/50ML IV EMUL
INTRAVENOUS | Status: DC | PRN
  Administered 2020-11-11: 130 ug/kg/min via INTRAVENOUS

## 2020-11-11 MED ORDER — PROPOFOL 10 MG/ML IV BOLUS
INTRAVENOUS | Status: DC | PRN
  Administered 2020-11-11: 100 mg via INTRAVENOUS

## 2020-11-11 MED ORDER — PROPOFOL 10 MG/ML IV BOLUS
INTRAVENOUS | Status: AC
  Filled 2020-11-11: qty 20

## 2020-11-11 MED ORDER — PROPOFOL 500 MG/50ML IV EMUL
INTRAVENOUS | Status: AC
  Filled 2020-11-11: qty 50

## 2020-11-11 MED ORDER — LIDOCAINE HCL (PF) 2 % IJ SOLN
INTRAMUSCULAR | Status: AC
  Filled 2020-11-11: qty 5

## 2020-11-11 NOTE — Anesthesia Preprocedure Evaluation (Addendum)
Anesthesia Evaluation  Patient identified by MRN, date of birth, ID band Patient awake    Reviewed: Allergy & Precautions, NPO status , Patient's Chart, lab work & pertinent test results  History of Anesthesia Complications Negative for: history of anesthetic complications  Airway Mallampati: III   Neck ROM: Full    Dental   Missing few molars:   Pulmonary asthma , former smoker (quit age 75),    Pulmonary exam normal breath sounds clear to auscultation       Cardiovascular hypertension, Normal cardiovascular exam Rhythm:Regular Rate:Normal  Myocardial perfusion 09/01/18:  LVEF= 68%   FINDINGS:  Regional wall motion: reveals normal myocardial thickening and wall motion.  The overall quality of the study is good.   Artifacts noted: no  Left ventricular cavity: normal.  Perfusion Analysis: SPECT images demonstrate homogeneous tracer distribution throughout the myocardium. Defect type : Normal    Neuro/Psych Vertigo     GI/Hepatic GERD  ,  Endo/Other  Obesity   Renal/GU negative Renal ROS     Musculoskeletal  (+) Arthritis ,   Abdominal   Peds  Hematology negative hematology ROS (+)   Anesthesia Other Findings   Reproductive/Obstetrics                            Anesthesia Physical Anesthesia Plan  ASA: 2  Anesthesia Plan: General   Post-op Pain Management:    Induction: Intravenous  PONV Risk Score and Plan: 3 and Propofol infusion, TIVA and Treatment may vary due to age or medical condition  Airway Management Planned: Natural Airway  Additional Equipment:   Intra-op Plan:   Post-operative Plan:   Informed Consent: I have reviewed the patients History and Physical, chart, labs and discussed the procedure including the risks, benefits and alternatives for the proposed anesthesia with the patient or authorized representative who has indicated his/her understanding and  acceptance.       Plan Discussed with: CRNA  Anesthesia Plan Comments:         Anesthesia Quick Evaluation

## 2020-11-11 NOTE — H&P (Signed)
Outpatient short stay form Pre-procedure 11/11/2020  Jaclyn Rubenstein, MD  Primary Physician: Maryland Pink, MD  Reason for visit:  Surveillance colonoscopy  History of present illness:   75 y/o lady with history of hypertension and GERD here for surveillance colonoscopy due to family history of colon cancer in her brother at age of 68 and personal history of adenomatous polyp on last colonoscopy done about 6 years ago. No blood thinners. History of cholecystectomy and hysterectomy.    Current Facility-Administered Medications:    0.9 %  sodium chloride infusion, , Intravenous, Continuous, Jahvier Aldea, Hilton Cork, MD, Last Rate: 20 mL/hr at 11/11/20 0848, New Bag at 11/11/20 0848  Medications Prior to Admission  Medication Sig Dispense Refill Last Dose   amLODipine (NORVASC) 5 MG tablet Take 5 mg by mouth daily.   11/10/2020   aspirin 81 MG tablet Take 81 mg by mouth daily.   Past Month   fexofenadine (ALLEGRA) 180 MG tablet Take 180 mg by mouth daily.   Past Week   fluticasone (FLONASE) 50 MCG/ACT nasal spray Place 2 sprays into both nostrils daily. 16 g 6 Past Week   hydrOXYzine (ATARAX/VISTARIL) 25 MG tablet Take 25 mg by mouth 3 (three) times daily as needed. Reported on 07/15/2015   Past Week   lisinopril (PRINIVIL,ZESTRIL) 10 MG tablet Take 10 mg by mouth daily.  6 11/10/2020   meclizine (ANTIVERT) 12.5 MG tablet Take 1 tablet (12.5 mg total) by mouth 3 (three) times daily. 30 tablet 0 Past Month   meloxicam (MOBIC) 15 MG tablet Take 15 mg by mouth daily.   11/10/2020   Multiple Vitamins-Minerals (MULTIVITAMIN WITH MINERALS) tablet Take 1 tablet by mouth daily.   Past Week   omega-3 acid ethyl esters (LOVAZA) 1 G capsule Take by mouth daily.   11/10/2020   triamterene-hydrochlorothiazide (MAXZIDE) 75-50 MG tablet Take 75 tablets by mouth daily.   11/10/2020   vitamin B-12 (CYANOCOBALAMIN) 1000 MCG tablet Take 1,000 mcg by mouth daily.   Past Week   fluocinonide ointment (LIDEX) 0.05  % Apply 0.5 application topically as needed.      nystatin cream (MYCOSTATIN) Apply 1 application topically 2 (two) times daily. 30 g 1    omeprazole (PRILOSEC) 20 MG capsule Take 20 mg by mouth daily.        No Known Allergies   Past Medical History:  Diagnosis Date   Arthritis    Asthma    Colon polyps    Dyspnea    Hypertension    Osteoporosis    Vertigo     Review of systems:  Otherwise negative.    Physical Exam  Gen: Alert, oriented. Appears stated age.  HEENT: PERRLA. Lungs: No respiratory distress CV: RRR Abd: soft, benign, no masses Ext: No edema    Planned procedures: Proceed with colonoscopy. The patient understands the nature of the planned procedure, indications, risks, alternatives and potential complications including but not limited to bleeding, infection, perforation, damage to internal organs and possible oversedation/side effects from anesthesia. The patient agrees and gives consent to proceed.  Please refer to procedure notes for findings, recommendations and patient disposition/instructions.     Jaclyn Rubenstein, MD [facility]

## 2020-11-11 NOTE — Interval H&P Note (Signed)
History and Physical Interval Note:  11/11/2020 9:10 AM  Jaclyn Hall  has presented today for surgery, with the diagnosis of HISTORY OF ADENOMATOUS POLYPS.  The various methods of treatment have been discussed with the patient and family. After consideration of risks, benefits and other options for treatment, the patient has consented to  Procedure(s): COLONOSCOPY WITH PROPOFOL (N/A) as a surgical intervention.  The patient's history has been reviewed, patient examined, no change in status, stable for surgery.  I have reviewed the patient's chart and labs.  Questions were answered to the patient's satisfaction.     Lesly Rubenstein  Ok to proceed with colonoscopy

## 2020-11-11 NOTE — Op Note (Signed)
[facility] Gastroenterology Patient Name: Jaclyn Hall Procedure Date: 11/11/2020 9:10 AM MRN: [id number] Account #: [id number] Date of Birth: 20-Jul-1945 Admit Type: Outpatient Age: 75 Room: [facility] ENDO ROOM 1 Gender: Female Note Status: Finalized Instrument Name: Jasper Riling [id number] Procedure:             Colonoscopy Indications:           Surveillance: Personal history of adenomatous polyps                         on last colonoscopy > 5 years ago, Family history of                         colon cancer in a first-degree relative before age 59                         years Providers:             Andrey Farmer MD, MD Medicines:             Monitored Anesthesia Care Complications:         No immediate complications. Estimated blood loss:                         Minimal. Procedure:             Pre-Anesthesia Assessment:                        - Prior to the procedure, a History and Physical was                         performed, and patient medications and allergies were                         reviewed. The patient is competent. The risks and                         benefits of the procedure and the sedation options and                         risks were discussed with the patient. All questions                         were answered and informed consent was obtained.                         Patient identification and proposed procedure were                         verified by the physician, the nurse, the anesthetist                         and the technician in the endoscopy suite. Mental                         Status Examination: alert and oriented. Airway                         Examination: normal oropharyngeal airway and neck  mobility. Respiratory Examination: clear to                         auscultation. CV Examination: normal. Prophylactic                         Antibiotics: The patient does not require prophylactic                          antibiotics. Prior Anticoagulants: The patient has                         taken no previous anticoagulant or antiplatelet                         agents. ASA Grade Assessment: II - A patient with mild                         systemic disease. After reviewing the risks and                         benefits, the patient was deemed in satisfactory                         condition to undergo the procedure. The anesthesia                         plan was to use monitored anesthesia care (MAC).                         Immediately prior to administration of medications,                         the patient was re-assessed for adequacy to receive                         sedatives. The heart rate, respiratory rate, oxygen                         saturations, blood pressure, adequacy of pulmonary                         ventilation, and response to care were monitored                         throughout the procedure. The physical status of the                         patient was re-assessed after the procedure.                        After obtaining informed consent, the colonoscope was                         passed under direct vision. Throughout the procedure,                         the patient's blood pressure, pulse, and oxygen  saturations were monitored continuously. The                         Colonoscope was introduced through the anus and                         advanced to the the cecum, identified by appendiceal                         orifice and ileocecal valve. The colonoscopy was                         performed without difficulty. The patient tolerated                         the procedure well. The quality of the bowel                         preparation was good. Findings:      The perianal and digital rectal examinations were normal.      A 2 mm polyp was found in the cecum. The polyp was sessile. The polyp       was removed with a cold  snare. Resection and retrieval were complete.       Estimated blood loss was minimal.      A 4 mm polyp was found in the ascending colon. The polyp was sessile.       The polyp was removed with a cold snare. Resection and retrieval were       complete. Estimated blood loss was minimal.      A 1 mm polyp was found in the transverse colon. The polyp was sessile.       The polyp was removed with a jumbo cold forceps. Resection and retrieval       were complete. Estimated blood loss was minimal.      Scattered small-mouthed diverticula were found in the sigmoid colon,       descending colon, transverse colon, hepatic flexure and ascending colon.      Internal hemorrhoids were found during retroflexion. The hemorrhoids       were Grade I (internal hemorrhoids that do not prolapse).      The exam was otherwise without abnormality on direct and retroflexion       views. Impression:            - One 2 mm polyp in the cecum, removed with a cold                         snare. Resected and retrieved.                        - One 4 mm polyp in the ascending colon, removed with                         a cold snare. Resected and retrieved.                        - One 1 mm polyp in the transverse colon, removed with  a jumbo cold forceps. Resected and retrieved.                        - Diverticulosis in the sigmoid colon, in the                         descending colon, in the transverse colon, at the                         hepatic flexure and in the ascending colon.                        - Internal hemorrhoids.                        - The examination was otherwise normal on direct and                         retroflexion views. Recommendation:        - Discharge patient to home.                        - Resume previous diet.                        - Continue present medications.                        - Await pathology results.                        - Repeat colonoscopy  for surveillance based on                         pathology results.                        - Return to referring physician as previously                         scheduled. Procedure Code(s):     --- Professional ---                        [phone], Colonoscopy, flexible; with removal of                         tumor(s), polyp(s), or other lesion(s) by snare                         technique                        45380, 90, Colonoscopy, flexible; with biopsy, single                         or multiple Diagnosis Code(s):     --- Professional ---                        Z86.010, Personal history of colonic polyps  K63.5, Polyp of colon                        K64.0, First degree hemorrhoids                        Z80.0, Family history of malignant neoplasm of                         digestive organs                        K57.30, Diverticulosis of large intestine without                         perforation or abscess without bleeding CPT copyright 2019 [employer]. All rights reserved. The codes documented in this report are preliminary and upon coder review may  be revised to meet current compliance requirements. Andrey Farmer MD, MD 11/11/2020 9:42:24 AM Number of Addenda: 0 Note Initiated On: 11/11/2020 9:10 AM Scope Withdrawal Time: 0 hours 12 minutes 22 seconds  Total Procedure Duration: 0 hours 20 minutes 41 seconds  Estimated Blood Loss:  Estimated blood loss was minimal.      [facility]

## 2020-11-11 NOTE — Transfer of Care (Signed)
Immediate Anesthesia Transfer of Care Note  Patient: Jaclyn Hall  Procedure(s) Performed: COLONOSCOPY WITH PROPOFOL  Patient Location: PACU and Endoscopy Unit  Anesthesia Type:General  Level of Consciousness: drowsy and patient cooperative  Airway & Oxygen Therapy: Patient Spontanous Breathing  Post-op Assessment: Report given to RN and Post -op Vital signs reviewed and stable  Post vital signs: Reviewed and stable  Last Vitals:  Vitals Value Taken Time  BP 134/67 11/11/20 0941  Temp    Pulse 74 11/11/20 0942  Resp 16 11/11/20 0942  SpO2 98 % 11/11/20 0942  Vitals shown include unvalidated device data.  Last Pain:  Vitals:   11/11/20 0941  TempSrc:   PainSc: 0-No pain         Complications: No notable events documented.

## 2020-11-11 NOTE — Anesthesia Postprocedure Evaluation (Signed)
Anesthesia Post Note  Patient: Jaclyn Hall  Procedure(s) Performed: COLONOSCOPY WITH PROPOFOL  Patient location during evaluation: PACU Anesthesia Type: General Level of consciousness: awake and alert, oriented and patient cooperative Pain management: pain level controlled Vital Signs Assessment: post-procedure vital signs reviewed and stable Respiratory status: spontaneous breathing, nonlabored ventilation and respiratory function stable Cardiovascular status: blood pressure returned to baseline and stable Postop Assessment: adequate PO intake Anesthetic complications: no   No notable events documented.   Last Vitals:  Vitals:   11/11/20 1001 11/11/20 1011  BP: (!) 143/59 (!) 164/70  Pulse: 100 61  Resp: 12 19  Temp:    SpO2: 97% 95%    Last Pain:  Vitals:   11/11/20 1011  TempSrc:   PainSc: 0-No pain                 Darrin Nipper

## 2020-11-12 ENCOUNTER — Encounter: Payer: Self-pay | Admitting: Gastroenterology

## 2020-11-12 LAB — SURGICAL PATHOLOGY

## 2021-10-08 ENCOUNTER — Encounter: Payer: Self-pay | Admitting: Urology

## 2021-10-08 ENCOUNTER — Ambulatory Visit: Payer: Medicare HMO | Admitting: Urology

## 2021-10-08 VITALS — BP 172/77 | HR 75 | Ht 62.0 in | Wt 188.0 lb

## 2021-10-08 DIAGNOSIS — R102 Pelvic and perineal pain: Secondary | ICD-10-CM | POA: Diagnosis not present

## 2021-10-08 DIAGNOSIS — R3129 Other microscopic hematuria: Secondary | ICD-10-CM

## 2021-10-08 DIAGNOSIS — R1030 Lower abdominal pain, unspecified: Secondary | ICD-10-CM

## 2021-10-08 LAB — URINALYSIS, COMPLETE
Bilirubin, UA: NEGATIVE
Glucose, UA: NEGATIVE
Ketones, UA: NEGATIVE
Nitrite, UA: NEGATIVE
Specific Gravity, UA: 1.02 (ref 1.005–1.030)
Urobilinogen, Ur: 0.2 mg/dL (ref 0.2–1.0)
pH, UA: 6 (ref 5.0–7.5)

## 2021-10-08 LAB — MICROSCOPIC EXAMINATION

## 2021-10-08 NOTE — Progress Notes (Signed)
10/08/2021 10:57 AM   Jaclyn Hall 1945-12-31 [id number]  Referring provider: Maryland Pink, MD [address] [facility] [place],  [place] [address]  Chief Complaint  Patient presents with   Dysuria    HPI: Jaclyn Hall is a 76 y.o. female referred for evaluation of dysuria.  3-4 month history of intermittent lower abdominal discomfort which is described as a pressure sensation located in the midline. Denies dysuria or urinary frequency, urgency or incontinence Denies gross hematuria Lower abdominal discomfort not related to voiding or bladder filling Present asymptomatic Has had 2 negative UAs and urine cultures   PMH: Past Medical History:  Diagnosis Date   Arthritis    Asthma    Colon polyps    Dyspnea    Hypertension    Osteoporosis    Vertigo     Surgical History: Past Surgical History:  Procedure Laterality Date   ABDOMINAL HYSTERECTOMY     CHOLECYSTECTOMY     COLONOSCOPY     COLONOSCOPY N/A 11/22/2014   Procedure: COLONOSCOPY;  Surgeon: Lollie Sails, MD;  Location: [facility] ENDOSCOPY;  Service: Endoscopy;  Laterality: N/A;   COLONOSCOPY WITH PROPOFOL N/A 11/11/2020   Procedure: COLONOSCOPY WITH PROPOFOL;  Surgeon: Lesly Rubenstein, MD;  Location: [facility] ENDOSCOPY;  Service: Endoscopy;  Laterality: N/A;   OOPHORECTOMY      Home Medications:  Allergies as of 10/08/2021   No Known Allergies      Medication List        Accurate as of October 08, 2021 10:57 AM. If you have any questions, ask your nurse or doctor.          amLODipine 5 MG tablet Commonly known as: NORVASC Take 5 mg by mouth daily.   aspirin 81 MG tablet Take 81 mg by mouth daily.   cyanocobalamin 1000 MCG tablet Commonly known as: VITAMIN B12 Take 1,000 mcg by mouth daily.   fexofenadine 180 MG tablet Commonly known as: ALLEGRA Take 180 mg by mouth daily.   fluocinonide ointment 0.05 % Commonly known as: LIDEX Apply 0.5 application topically as  needed.   fluticasone 50 MCG/ACT nasal spray Commonly known as: FLONASE Place 2 sprays into both nostrils daily.   hydrOXYzine 25 MG tablet Commonly known as: ATARAX Take 25 mg by mouth 3 (three) times daily as needed. Reported on 07/15/2015   lisinopril 10 MG tablet Commonly known as: ZESTRIL Take 10 mg by mouth daily.   meclizine 12.5 MG tablet Commonly known as: ANTIVERT Take 1 tablet (12.5 mg total) by mouth 3 (three) times daily.   meloxicam 15 MG tablet Commonly known as: MOBIC Take 15 mg by mouth daily.   multivitamin with minerals tablet Take 1 tablet by mouth daily.   nystatin cream Commonly known as: MYCOSTATIN Apply 1 application topically 2 (two) times daily.   omega-3 acid ethyl esters 1 g capsule Commonly known as: LOVAZA Take by mouth daily.   omeprazole 20 MG capsule Commonly known as: PRILOSEC Take 20 mg by mouth daily.   triamterene-hydrochlorothiazide 75-50 MG tablet Commonly known as: MAXZIDE Take 75 tablets by mouth daily.        Allergies: No Known Allergies  Family History: History reviewed. No pertinent family history.  Social History:  reports that she has quit smoking. She has never used smokeless tobacco. She reports that she does not drink alcohol and does not use drugs.   Physical Exam: BP (!) 172/77   Pulse 75   Ht 5'  2" (1.575 m)   Wt 188 lb (85.3 kg)   BMI 34.39 kg/m   Constitutional:  Alert and oriented, No acute distress. HEENT: [place] AT Respiratory: Normal respiratory effort, no increased work of breathing. GI: Abdomen is soft, nontender, nondistended, no abdominal masses Psychiatric: Normal mood and affect.  Laboratory Data:  Urinalysis Dipstick trace blood/trace protein/trace leukocytes Microscopy 6-10 WBC/3-10 RBC/moderate bacteria   Assessment & Plan:    1.  Lower abdominal pain Undetermined etiology and may not be urologic in origin No dysuria or voiding symptoms Noncontrast CT abdomen pelvis scheduled  and will call with results UA today with pyuria though asymptomatic.  Urine culture was ordered   Abbie Sons, MD  [facility] [address] [place], [place] [address] [phone]

## 2021-10-12 LAB — CULTURE, URINE COMPREHENSIVE

## 2021-11-18 ENCOUNTER — Ambulatory Visit
Admission: RE | Admit: 2021-11-18 | Discharge: 2021-11-18 | Disposition: A | Discharge status: Home / Self Care | Payer: Medicare HMO | Source: Ambulatory Visit | Attending: Urology | Admitting: Urology

## 2021-11-18 DIAGNOSIS — R3129 Other microscopic hematuria: Secondary | ICD-10-CM | POA: Diagnosis present

## 2021-11-18 DIAGNOSIS — R102 Pelvic and perineal pain: Secondary | ICD-10-CM | POA: Diagnosis present

## 2021-11-20 ENCOUNTER — Telehealth: Payer: Self-pay | Admitting: Family Medicine

## 2021-11-20 NOTE — Telephone Encounter (Signed)
Patient notified and voiced understanding.

## 2021-11-20 NOTE — Telephone Encounter (Signed)
-----   Message from Abbie Sons, MD sent at 11/19/2021  8:45 PM EDT ----- CT showed no genitourinary abnormalities.  Recommend PCP follow-up if still having abdominal pain

## 2021-11-26 NOTE — Progress Notes (Signed)
 QUICK REFERENCE INFORMATION: CMS.gov Medicare Wellness Visits  Medicare Annual Wellness Visit  Subjective:   Jaclyn Hall is a 76 y.o. Female who presents for an Annual Wellness Visit.  Patient Active Problem List  Diagnosis  . Hypertension  . Osteoporosis, post-menopausal  . Allergic state  . Arthritis  . Benign paroxysmal positional vertigo due to bilateral vestibular disorder  . Mild intermittent asthma without complication  . Atypical chest pain  . Peripheral edema  . Shortness of breath     Outpatient Medications Prior to Visit  Medication Sig Dispense Refill  . albuterol  90 mcg/actuation inhaler INHALE 2 INHALATIONS INTO THE LUNGS EVERY 4 HOURS AS NEEDED FOR WHEEZE OR FOR SHORTNESS OF BREATH 18 each 1  . amLODIPine  (NORVASC ) 5 MG tablet Take 1 tablet (5 mg total) by mouth 2 (two) times daily 180 tablet 3  . DULoxetine (CYMBALTA) 20 MG DR capsule Take 1 capsule (20 mg total) by mouth once daily 90 capsule 3  . fluocinonide (LIDEX) 0.05 % ointment as needed.   0  . fluticasone  propionate (FLONASE ) 50 mcg/actuation nasal spray SPRAY 2 SPRAYS INTO EACH NOSTRIL EVERY DAY 16 g 0  . fluticasone -umeclidinium-vilanterol (TRELEGY ELLIPTA) 100-62.5-25 mcg inhaler Inhale 1 inhalation into the lungs once daily 1 each 0  . loratadine  (CLARITIN ) 10 mg tablet Take 1 tablet (10 mg total) by mouth once daily 30 tablet 1  . meloxicam (MOBIC) 15 MG tablet Take 1 tablet (15 mg total) by mouth once daily 30 tablet 11  . multivitamin tablet Take 1 tablet by mouth once daily    . omeprazole (PRILOSEC) 20 MG DR capsule Take 1 capsule (20 mg total) by mouth once daily 90 capsule 3  . triamterene -hydroCHLOROthiazide (MAXZIDE) 75-50 mg tablet TAKE 1 TABLET BY MOUTH EVERY DAY 90 tablet 3  . montelukast (SINGULAIR) 10 mg tablet Take 1 tablet (10 mg total) by mouth nightly 30 tablet 11   No facility-administered medications prior to visit.    Social History   Socioeconomic History  . Marital  status: Married  Tobacco Use  . Smoking status: Former  . Smokeless tobacco: Never  Vaping Use  . Vaping Use: Never used  Substance and Sexual Activity  . Alcohol use: No    Alcohol/week: 0.0 standard drinks of alcohol  . Drug use: Never  . Sexual activity: Defer   Social Determinants of Health   Financial Resource Strain: Unknown (11/26/2021)   Overall Financial Resource Strain (CARDIA)   . Difficulty of Paying Living Expenses: Patient refused  Food Insecurity: Unknown (11/26/2021)   Hunger Vital Sign   . Worried About Programme researcher, broadcasting/film/video in the Last Year: Patient refused   . Ran Out of Food in the Last Year: Patient refused  Transportation Needs: No Transportation Needs (11/26/2021)   PRAPARE - Transportation   . Lack of Transportation (Medical): No   . Lack of Transportation (Non-Medical): No  Housing Stability: Unknown (11/26/2021)   Housing Stability Vital Sign   . Unable to Pay for Housing in the Last Year: No   . Unstable Housing in the Last Year: No     Family History  Problem Relation Age of Onset  . High blood pressure (Hypertension) Mother   . Stroke Mother   . Coronary Artery Disease (Blocked arteries around heart) Father   . Leukemia Father   . Myocardial Infarction (Heart attack) Father   . Colon cancer Brother      Past Medical History:  Diagnosis Date  . Allergic  state   . Arthritis   . Asthma without status asthmaticus   . Colon polyp   . Diverticulosis   . Hx of colonic polyp   . Hypertension   . Osteoporosis, post-menopausal   . Spinal cord compression (CMS-HCC) 10/04/2017  . Tubular adenoma of colon 11/22/2014     Recent Hospitalizations? No  Health Habits: Current exercise activities include: housecleaning and walking Exercise: 4-5 times/week. Diet: in general, a healthy diet    How many times in the past year has patient used an illegal drug or used a prescription medication for non-medical reasons? 0 (>=1 is positive) Has the patient  used opioid medication within the last year? No  Current Medical Providers and Suppliers: [facility] Patient Care Team: Valora Lynwood FALCON, MD as PCP - General (Family Medicine) Future Appointments     Date/Time Provider Department Center Visit Type   11/25/2022 8:00 AM [facility] CL LAB   12/02/2022 2:30 PM Valora Lynwood FALCON, MD [facility] CL PHYSICAL      eye doctor  Age-appropriate Screening Schedule: The list below includes current immunization status and future screening recommendations based on patient's age. Orders for these recommended tests are listed in the plan section. The patient has been provided with a written plan. Immunization History  Administered Date(s) Administered  . COVID-19 Moderna Vaccine (1st,2nd,3rd dose = 0.41ml) 03/05/2019, 04/02/2019, 12/19/2019  . Influenza IIV4, IM PF (6 mo+) (FLULAVAL/FLUZONE/FLUARIX QUAD) 11/09/2019  . Influenza IIV4, cell derived (Egg-Free) PF (6 mo+) (Flucelvax QUAD) 10/27/2018, 10/24/2020  . Influenza, IM unspecified 09/28/2016, 11/25/2017  . RZV(>=39YR -OR-19+YRS IF  IMMCOMP) VACCINE (SHINGRIX) 01/04/2020, 03/04/2020, 03/18/2020    Health Maintenance Topics with due status: Overdue     Topic Date Due   Hepatitis C Screen Never done   Pneumococcal Vaccine: 65+ Never done   Adult Tetanus (Td And Tdap) Never done   Mammogram 10/01/2015   COVID-19 Vaccine 09/18/2021   Influenza Vaccine 09/18/2021   Health Maintenance Topics with due status: Not Due     Topic Last Completion Date   DXA Bone Density Scan 11/21/2019   Colorectal Cancer Screening 11/11/2020   Creatinine Level 11/19/2021   Potassium Level 11/19/2021   Lipid Panel 11/19/2021   Serum Calcium  11/19/2021   Diabetes Screening 11/19/2021   Depression Screening 11/26/2021   Medicare Initial or AWV 11/26/2021   Health Maintenance Topics with due status: Completed     Topic Last Completion Date   Shingrix 03/18/2020    Health Maintenance Topics with due status: Aged Out     Topic Date Due   Hib Vaccines Aged Out   Hepatitis A Vaccines Aged Out   HPV Vaccines Aged Out     Depression Screen-PHQ2/9 completed today  PHQ-2   PHQ-2 Over the last 2 weeks, how often have you been bothered by any of the following problems? Little interest or pleasure in doing things: Not At All Feeling down, depressed, or hopeless (or irritable for Teens only)?: Not At All Total Prescreening Score: 0  PHQ-9 (if PHQ >=3) PHQ-9 Over the last 2 weeks, how often have you been bothered by any of the following problems? Total Score =: 0  PHQ-2 Interpretation Values between 0-3 are considered not significant for depression  PHQ-9 Interpretation and Treatment Recommendations:  0-4= None  5-9= Mild / Treatment: Support, educate to call if worse; return in one month  10-14= Moderate / Treatment: Support, watchful waiting; Antidepressant or Psychotherapy  15-19=  Moderately severe / Treatment: Antidepressant OR Psychotherapy  >= 20 = Major depression, severe / Antidepressant AND Psychotherapy  Patient Health Risk Assessment questionnaire ( HRA <redacted file path>) (if patient completed in MyChart or added in flowsheet)    * No data to display          Functional Ability/Safety Screen: 1. Was the patient's timed Get Up and Go Test unsteady or longer than 30 sec? No  How to perform Timed Up and Go test (TUG): [email or web address].castaneda.[email or web address]  2. Does the patient have difficulty with: Shaune index)  Bathing: No   Dressing: No Toileting: No   Transferring: No  Continence: No  Feeding: No   3. Does the home have: Rugs in the hallway: No Grab bars in the bathroom: No Stairs in home: Yes Handrails on the stairs: yes Poor lighting: No  Hearing Evaluation: Does the patient have trouble hearing the television when others do not? No Does the patient strain to hear/understand conversations?  No  Advance Care Planning: Patient has executed an Advance Directive: no  If no, patient was given the opportunity to execute an Advance Directive today? yes  Are the patient's advanced directives in [place]? no This patient has the ability to prepare an Advance Directive: Yes Provider is willing to follow the patient's wishes: Yes  Cognitive Assessment: Cognitive screen used: no problems Results: The patient does not have any evidence of any cognitive problems and denies any change in mood/affect, appearance, speech, memory or motor skills.  Identification of Risk Factors: Risk factors include: weight   Review of Systems  Objective:   Vitals:   11/26/21 1445  BP: (!) 162/70  Pulse: 81  Weight: 85.7 kg (189 lb)  Height: 157.5 cm (5' 2)   Body mass index is 34.57 kg/m. Home vitals:     BP (!) 162/70   Pulse 81   Ht 157.5 cm (5' 2)   Wt 85.7 kg (189 lb)   BMI 34.57 kg/m   General Appearance:    Alert, cooperative, no distress, appears stated age  Head:    Normocephalic, without obvious abnormality, atraumatic  Eyes:    PERRL, conjunctiva/corneas clear, EOM's intact, fundi    benign, both eyes  Ears:    Normal TM's and external ear canals, both ears  Nose:   Nares normal, septum midline, mucosa normal, no drainage    or sinus tenderness  Throat:   Lips, mucosa, and tongue normal; teeth and gums normal  Neck:   Supple, symmetrical, trachea midline, no adenopathy;    thyroid:  no enlargement/tenderness/nodules; no carotid   bruit or JVD  Back:     Symmetric, no curvature, ROM normal, no CVA tenderness  Lungs:     Clear to auscultation bilaterally, respirations unlabored  Chest Wall:    No tenderness or deformity   Heart:    Regular rate and rhythm, S1 and S2 normal, no murmur, rub   or gallop  Breast Exam:    Deferred  Abdomen:     Soft, non-tender, bowel sounds active all four quadrants,    no masses, no organomegaly  Genitalia:    Deferred  Extremities:    Extremities normal, atraumatic, no cyanosis or edema  Pulses:   2+ and symmetric all extremities  Skin:   Skin color, texture, turgor normal, no rashes or lesions  Lymph nodes:   Cervical, supraclavicular, and axillary nodes normal  Neurologic:   CNII-XII intact, normal strength, sensation and reflexes    throughout  Assessment/Plan:   Patient Self-Management and Personalized Health Advice The patient has been provided with information about: weight management  During the course of the visit the patient was educated and counseled about appropriate screening and preventive services including:  Influenza vaccine RSV  The patient's BMI is above the acceptable range; discussed or provided materials on diet/exercise  Diagnoses and all orders for this visit:  Medicare annual wellness visit, subsequent  Routine adult health maintenance Encouraged healthy diet, regular exercise, regular health maintenance Essential hypertension -     CBC w/auto Differential (3 Part); Future -     Lipid Panel w/calc LDL; Future -     Comprehensive Metabolic Panel (CMP); Future -     Urinalysis w/Microscopic; Future Pressure a little elevated today, encouraged her to monitor outside of here Stage 3a chronic kidney disease (CMS-HCC) Stable, slightly improved, continue adequate fluids Prediabetes -     Hemoglobin A1C; Future  Other orders -     Follow up in Primary Care; Future          Follow-up     Future Labs/Procedures Expected by Expires   Follow up in Primary Care [[id number] Custom]  11/27/2022 (Approximate) 05/27/2023   Questions:     Does this order need to be coordinated with another visit at scheduling? The patient will need to schedule this at the front desk before they leave.: No   Who is this follow-up with?: PCP   What type of follow up is needed?: Annual Wellness   Allow telemedicine?: In Person Only   What's the reason for follow up?:           Future Appointments      Date/Time Provider Department Center Visit Type   11/25/2022 8:00 AM [facility] [place] [facility] LAB   12/02/2022 2:30 PM Valora Lynwood FALCON, MD [facility] [place] [facility] PHYSICAL       An after visit summary was provided for the patient either in written format or through MyChart  *Some images could not be shown.

## 2023-02-11 ENCOUNTER — Other Ambulatory Visit: Payer: Self-pay

## 2023-02-11 ENCOUNTER — Emergency Department
Admission: EM | Admit: 2023-02-11 | Discharge: 2023-02-11 | Disposition: A | Discharge status: Home / Self Care | Payer: Medicare HMO | Attending: Emergency Medicine | Admitting: Emergency Medicine

## 2023-02-11 ENCOUNTER — Emergency Department: Payer: Medicare HMO

## 2023-02-11 DIAGNOSIS — R42 Dizziness and giddiness: Secondary | ICD-10-CM | POA: Diagnosis present

## 2023-02-11 DIAGNOSIS — D709 Neutropenia, unspecified: Secondary | ICD-10-CM | POA: Diagnosis not present

## 2023-02-11 LAB — URINALYSIS, ROUTINE W REFLEX MICROSCOPIC
Bilirubin Urine: NEGATIVE
Glucose, UA: NEGATIVE mg/dL
Hgb urine dipstick: NEGATIVE
Ketones, ur: NEGATIVE mg/dL
Leukocytes,Ua: NEGATIVE
Nitrite: NEGATIVE
Protein, ur: NEGATIVE mg/dL
Specific Gravity, Urine: 1.019 (ref 1.005–1.030)
pH: 5 (ref 5.0–8.0)

## 2023-02-11 LAB — BASIC METABOLIC PANEL
Anion gap: 13 (ref 5–15)
BUN: 24 mg/dL — ABNORMAL HIGH (ref 8–23)
CO2: 26 mmol/L (ref 22–32)
Calcium: 10.2 mg/dL (ref 8.9–10.3)
Chloride: 99 mmol/L (ref 98–111)
Creatinine, Ser: 1.08 mg/dL — ABNORMAL HIGH (ref 0.44–1.00)
GFR, Estimated: 53 mL/min — ABNORMAL LOW (ref >60)
Glucose, Bld: 114 mg/dL — ABNORMAL HIGH (ref 70–99)
Potassium: 4 mmol/L (ref 3.5–5.1)
Sodium: 138 mmol/L (ref 135–145)

## 2023-02-11 LAB — CBC WITH DIFFERENTIAL/PLATELET
Abs Immature Granulocytes: 0 10*3/uL (ref 0.00–0.07)
Basophils Absolute: 0 10*3/uL (ref 0.0–0.1)
Basophils Relative: 0 %
Eosinophils Absolute: 0.1 10*3/uL (ref 0.0–0.5)
Eosinophils Relative: 2 %
HCT: 41.5 % (ref 36.0–46.0)
Hemoglobin: 13.2 g/dL (ref 12.0–15.0)
Immature Granulocytes: 0 %
Lymphocytes Relative: 41 %
Lymphs Abs: 1.3 10*3/uL (ref 0.7–4.0)
MCH: 26.5 pg (ref 26.0–34.0)
MCHC: 31.8 g/dL (ref 30.0–36.0)
MCV: 83.2 fL (ref 80.0–100.0)
Monocytes Absolute: 0.3 10*3/uL (ref 0.1–1.0)
Monocytes Relative: 9 %
Neutro Abs: 1.5 10*3/uL — ABNORMAL LOW (ref 1.7–7.7)
Neutrophils Relative %: 48 %
Platelets: 363 10*3/uL (ref 150–400)
RBC: 4.99 MIL/uL (ref 3.87–5.11)
RDW: 13.2 % (ref 11.5–15.5)
WBC: 3.2 10*3/uL — ABNORMAL LOW (ref 4.0–10.5)
nRBC: 0 % (ref 0.0–0.2)

## 2023-02-11 MED ORDER — MECLIZINE HCL 12.5 MG PO TABS: 12.5000 mg | ORAL_TABLET | Freq: Three times a day (TID) | ORAL | 0 refills | Status: AC | PRN

## 2023-02-11 NOTE — ED Triage Notes (Signed)
Pt comes with c/o dizziness and headache. Pt states this has been going on for about month. Pt states it has gotten worse. Pt denies any cp or sob.

## 2023-02-11 NOTE — Discharge Instructions (Addendum)
You have been diagnosed with vertigo, neutropenia, mild decrease of GFR.  Please drink plenty of fluids, take meclizine 3 times per day.  Make an appointment for follow-up with your PCP, for further study of your neutropenia.

## 2023-02-11 NOTE — ED Provider Notes (Signed)
[facility] Provider Note    Event Date/Time   First MD Initiated Contact with Patient 02/11/23 1523     (approximate)   History   Dizziness   HPI  Jaclyn Hall is a 78 y.o. female who presents with history of dizziness in the last 3 days, feeling the room spinning around her.  States she is taking diuretics and amlodipine.  Patient has history of vertigo.  Patient denies nasal congestion, cough, fever, abdominal pain, diarrhea, dysuria.      Physical Exam   Triage Vital Signs: ED Triage Vitals [02/11/23 1231]  Encounter Vitals Group     BP (!) 157/56     Systolic BP Percentile      Diastolic BP Percentile      Pulse Rate 69     Resp 18     Temp 98 F (36.7 C)     Temp src      SpO2 98 %     Weight 180 lb (81.6 kg)     Height 5\' 1"  (1.549 m)     Head Circumference      Peak Flow      Pain Score 0     Pain Loc      Pain Education      Exclude from Growth Chart     Most recent vital signs: Vitals:   02/11/23 1231 02/11/23 1638  BP: (!) 157/56 (!) 146/62  Pulse: 69 70  Resp: 18 18  Temp: 98 F (36.7 C) 98.1 F (36.7 C)  SpO2: 98% 98%     Constitutional: Alert, nondistressed Eyes: Conjunctivae are normal.  PERRLA, full ROM.  Left eye vertical nystagmus Head: Atraumatic. Nose: No congestion/rhinnorhea. Mouth/Throat: Mucous membranes are moist.   Neck: Painless ROM.  Cardiovascular:   Good peripheral circulation. Respiratory: Normal respiratory effort.  No retractions.  Gastrointestinal: Soft and nontender.  Musculoskeletal:  no deformity Neurologic:  MAE spontaneously. No gross focal neurologic deficits are appreciated.  Skin:  Skin is warm, dry and intact. No rash noted. Psychiatric: Mood and affect are normal. Speech and behavior are normal.    ED Results / Procedures / Treatments   Labs (all labs ordered are listed, but only abnormal results are displayed) Labs Reviewed  BASIC METABOLIC PANEL - Abnormal; Notable  for the following components:      Result Value   Glucose, Bld 114 (*)    BUN 24 (*)    Creatinine, Ser 1.08 (*)    GFR, Estimated 53 (*)    All other components within normal limits  CBC WITH DIFFERENTIAL/PLATELET - Abnormal; Notable for the following components:   WBC 3.2 (*)    Neutro Abs 1.5 (*)    All other components within normal limits  URINALYSIS, ROUTINE W REFLEX MICROSCOPIC - Abnormal; Notable for the following components:   Color, Urine YELLOW (*)    APPearance CLEAR (*)    All other components within normal limits     EKG See physician read    RADIOLOGY I independently reviewed and interpreted imaging and agree with radiologists findings.      PROCEDURES:  Critical Care performed:   Procedures   MEDICATIONS ORDERED IN ED: Medications - No data to display   IMPRESSION / MDM / ASSESSMENT AND PLAN / ED COURSE  I reviewed the triage vital signs and the nursing notes.  Differential diagnosis includes, but is not limited to, vertigo, electrolyte disbalance, pseudotumor cerebri, epidural hematoma, Mnire syndrome  Patient's  presentation is most consistent with acute complicated illness / injury requiring diagnostic workup.   Patient's diagnosis is consistent with vertigo, neutropenia, mild decreased GFR. I independently reviewed and interpreted imaging and agree with radiologists findings. Labs are rea reassuring. I did review the patient's allergies and medications. Patient will be discharged home with prescriptions for meclizine. patient is to follow up with PCP as needed or otherwise directed. Patient is given ED precautions to return to the ED for any worsening or new symptoms. Discussed plan of care with patient, answered all of patient's questions, Patient agreeable to plan of care. Advised patient to take medications according to the instructions on the label. Discussed possible side effects of new medications. Patient verbalized understanding. Clinical  Course as of 02/11/23 1733  Fri Feb 11, 2023  1623 CBC with Differential(!) Neutropenia: 3.2 [AE]  1624 Basic metabolic panel(!) BUN 24 elevated, creatinine elevated 1.08, GFR decreased 53 [AE]  1732 Urinalysis, Routine w reflex microscopic -Urine, Clean Catch(!) Within normal limits [AE]    Clinical Course User Index [AE] Gladys Damme, PA-C     FINAL CLINICAL IMPRESSION(S) / ED DIAGNOSES   Final diagnoses:  Dizziness  Neutropenia, unspecified type (HCC)     Rx / DC Orders   ED Discharge Orders          Ordered    meclizine (ANTIVERT) 12.5 MG tablet  3 times daily PRN        02/11/23 1640             Note:  This document was prepared using Dragon voice recognition software and may include unintentional dictation errors.   Gladys Damme, PA-C 02/11/23 1734    Jene Every, MD 02/11/23 [phone]

## 2023-02-11 NOTE — ED Notes (Signed)
See triage notes. Patient c/o dizziness and headaches for the past month.

## 2023-08-29 NOTE — Progress Notes (Signed)
 Chief Complaint: Chief Complaint  Patient presents with  . Follow-up    Subjective: Jaclyn Hall is a 78 y.o. female in today for: Follow-up Patient comes in today for follow-up.  Notes blood pressures at home in the 140s to 170 range.  Does occasionally have vertigo but not as often.  Has not noticed her pulse rate being up much at home.  Taking her medications as directed.  Past Medical History:  Diagnosis Date  . Allergic state   . Arthritis   . Asthma without status asthmaticus (HHS-HCC)   . Colon polyp   . Diverticulosis   . Hx of colonic polyp   . Hypertension   . Osteoporosis, post-menopausal   . Spinal cord compression (CMS/HHS-HCC) 10/04/2017  . Tubular adenoma of colon 11/22/2014   Past Surgical History:  Procedure Laterality Date  . COLONOSCOPY  12/08/2010  . COLONOSCOPY  11/22/2014   Tubular adenoma of colon/Repeat 84yrs/MUS  . LAMINECTOMY THORACIC FOR EXCISON/EVACUATION EXTRADURAL SPINAL LESION Bilateral 10/05/2017   Procedure: LAMINECTOMY FOR EXCISION OR EVACUATION OF INTRASPINAL LESION OTHER THAN NEOPLASM, EXTRADURAL; THORACIC;  Surgeon: [place] Norleen Mighty, MD;  Location: [facility] OR;  Service: Neurosurgery;  Laterality: Bilateral;  . COLONOSCOPY  11/11/2020   Tubular adenomas/PHx CP/Repeat 45yrs/CTL  . CHOLECYSTECTOMY    . HYSTERECTOMY    . OOPHORECTOMY     Family History  Problem Relation Name Age of Onset  . High blood pressure (Hypertension) Mother    . Stroke Mother    . Coronary Artery Disease (Blocked arteries around heart) Father    . Leukemia Father    . Myocardial Infarction (Heart attack) Father    . Stroke Sister    . Colon cancer Brother     Current Outpatient Medications on File Prior to Visit  Medication Sig Dispense Refill  . albuterol  90 mcg/actuation inhaler INHALE 2 INHALATIONS INTO THE LUNGS EVERY 4 HOURS AS NEEDED FOR WHEEZE OR FOR SHORTNESS OF BREATH 18 each 1  . amLODIPine  (NORVASC ) 5 MG tablet TAKE 1 TABLET BY MOUTH TWICE A DAY 180  tablet 3  . DULoxetine (CYMBALTA) 20 MG DR capsule Take 1 capsule (20 mg total) by mouth once daily 90 capsule 3  . fluocinonide (LIDEX) 0.05 % ointment as needed.   0  . fluticasone  propionate (FLONASE ) 50 mcg/actuation nasal spray SPRAY 2 SPRAYS INTO EACH NOSTRIL EVERY DAY 16 g 0  . fluticasone -umeclidinium-vilanterol (TRELEGY ELLIPTA) 100-62.5-25 mcg inhaler Inhale 1 inhalation into the lungs once daily 1 each 0  . loratadine  (CLARITIN ) 10 mg tablet TAKE 1 TABLET BY MOUTH EVERY DAY 90 tablet 1  . meloxicam (MOBIC) 15 MG tablet TAKE 1 TABLET BY MOUTH EVERY DAY 30 tablet 11  . montelukast (SINGULAIR) 10 mg tablet TAKE 1 TABLET BY MOUTH EVERYDAY AT BEDTIME 90 tablet 1  . multivitamin tablet Take 1 tablet by mouth once daily    . omeprazole (PRILOSEC) 20 MG DR capsule TAKE 1 CAPSULE BY MOUTH EVERY DAY 90 capsule 3  . triamterene -hydroCHLOROthiazide (MAXZIDE) 75-50 mg tablet TAKE 1 TABLET BY MOUTH EVERY DAY 90 tablet 3   No current facility-administered medications on file prior to visit.    Review of Systems - 10 systems negative except as in HPI Objective: Vitals:   08/29/23 0935  BP: (!) 160/72  Pulse: 102   Body mass index is 35.9 kg/m.  In general she is in no acute distress, alert and oriented x 3 Lungs are clear to auscultation bilaterally Heart is regular rate and rhythm  without murmurs or gallops, rate rechecked at 96 Extremities without cyanosis clubbing or edema Assessment & Plan: Diagnoses and all orders for this visit:  Essential hypertension -     metoprolol SUCCinate (TOPROL-XL) 25 MG XL tablet; Take 1 tablet (25 mg total) by mouth once daily Will add metoprolol 25 mg, if seeing low heart rates or blood pressures can break in half Mild intermittent asthma without complication (HHS-HCC) Stable, continue current medications, no current problems Prediabetes A1c stable continue to monitor diet for sweets and carbohydrates Benign paroxysmal positional vertigo due to  bilateral vestibular disorder Improved, continue to monitor Other orders -     Follow up in Primary Care        Follow-up     Normal Orders This Visit   Follow up in Primary Care [[id number] Custom]    Questions:   Does this order need to be coordinated with another visit or should it be hidden from the patient portal? If yes to either, the patient will need to stop by the front desk or call to schedule.: No   Who is this follow-up with?: PCP   What type of follow up is needed?: Physical   What's the reason for follow up?:

## 2023-10-26 ENCOUNTER — Emergency Department

## 2023-10-26 ENCOUNTER — Other Ambulatory Visit: Payer: Self-pay

## 2023-10-26 ENCOUNTER — Emergency Department
Admission: EM | Admit: 2023-10-26 | Discharge: 2023-10-26 | Disposition: A | Discharge status: Home / Self Care | Source: Ambulatory Visit

## 2023-10-26 DIAGNOSIS — I10 Essential (primary) hypertension: Secondary | ICD-10-CM | POA: Diagnosis not present

## 2023-10-26 DIAGNOSIS — R197 Diarrhea, unspecified: Secondary | ICD-10-CM | POA: Insufficient documentation

## 2023-10-26 DIAGNOSIS — R109 Unspecified abdominal pain: Secondary | ICD-10-CM | POA: Insufficient documentation

## 2023-10-26 DIAGNOSIS — K760 Fatty (change of) liver, not elsewhere classified: Secondary | ICD-10-CM | POA: Diagnosis not present

## 2023-10-26 DIAGNOSIS — J45909 Unspecified asthma, uncomplicated: Secondary | ICD-10-CM | POA: Insufficient documentation

## 2023-10-26 DIAGNOSIS — K5730 Diverticulosis of large intestine without perforation or abscess without bleeding: Secondary | ICD-10-CM | POA: Diagnosis not present

## 2023-10-26 DIAGNOSIS — R1031 Right lower quadrant pain: Secondary | ICD-10-CM | POA: Diagnosis not present

## 2023-10-26 LAB — COMPREHENSIVE METABOLIC PANEL WITH GFR
ALT: 49 U/L — ABNORMAL HIGH (ref 0–44)
AST: 49 U/L — ABNORMAL HIGH (ref 15–41)
Albumin: 4.2 g/dL (ref 3.5–5.0)
Alkaline Phosphatase: 74 U/L (ref 38–126)
Anion gap: 17 — ABNORMAL HIGH (ref 5–15)
BUN: 23 mg/dL (ref 8–23)
CO2: 21 mmol/L — ABNORMAL LOW (ref 22–32)
Calcium: 9.6 mg/dL (ref 8.9–10.3)
Chloride: 94 mmol/L — ABNORMAL LOW (ref 98–111)
Creatinine, Ser: 1.33 mg/dL — ABNORMAL HIGH (ref 0.44–1.00)
GFR, Estimated: 41 mL/min — ABNORMAL LOW (ref >60)
Glucose, Bld: 119 mg/dL — ABNORMAL HIGH (ref 70–99)
Potassium: 2.9 mmol/L — ABNORMAL LOW (ref 3.5–5.1)
Sodium: 132 mmol/L — ABNORMAL LOW (ref 135–145)
Total Bilirubin: 0.8 mg/dL (ref 0.0–1.2)
Total Protein: 8.1 g/dL (ref 6.5–8.1)

## 2023-10-26 LAB — URINALYSIS, COMPLETE (UACMP) WITH MICROSCOPIC
Bacteria, UA: NONE SEEN
Bilirubin Urine: NEGATIVE
Glucose, UA: NEGATIVE mg/dL
Ketones, ur: NEGATIVE mg/dL
Leukocytes,Ua: NEGATIVE
Nitrite: NEGATIVE
Protein, ur: 30 mg/dL — AB
Specific Gravity, Urine: >1.046 — ABNORMAL HIGH (ref 1.005–1.030)
pH: 6 (ref 5.0–8.0)

## 2023-10-26 LAB — CBC
HCT: 39.7 % (ref 36.0–46.0)
Hemoglobin: 13.8 g/dL (ref 12.0–15.0)
MCH: 29.1 pg (ref 26.0–34.0)
MCHC: 34.8 g/dL (ref 30.0–36.0)
MCV: 83.8 fL (ref 80.0–100.0)
Platelets: 292 K/uL (ref 150–400)
RBC: 4.74 MIL/uL (ref 3.87–5.11)
RDW: 12.4 % (ref 11.5–15.5)
WBC: 4 K/uL (ref 4.0–10.5)
nRBC: 0 % (ref 0.0–0.2)

## 2023-10-26 LAB — MAGNESIUM: Magnesium: 1.9 mg/dL (ref 1.7–2.4)

## 2023-10-26 LAB — LIPASE, BLOOD: Lipase: 31 U/L (ref 11–51)

## 2023-10-26 MED ORDER — OXYCODONE-ACETAMINOPHEN 5-325 MG PO TABS: 1.0000 | ORAL_TABLET | ORAL | 0 refills | Status: AC | PRN

## 2023-10-26 MED ORDER — MORPHINE SULFATE (PF) 4 MG/ML IV SOLN
4.0000 mg | Freq: Once | INTRAVENOUS | Status: AC
  Administered 2023-10-26: 4 mg via INTRAVENOUS
  Filled 2023-10-26: qty 1

## 2023-10-26 MED ORDER — SODIUM CHLORIDE 0.9 % IV BOLUS
1000.0000 mL | Freq: Once | INTRAVENOUS | Status: AC
Start: 2023-10-26 — End: 2023-10-26
  Administered 2023-10-26: 1000 mL via INTRAVENOUS

## 2023-10-26 MED ORDER — ONDANSETRON 4 MG PO TBDP: 4.0000 mg | ORAL_TABLET | Freq: Three times a day (TID) | ORAL | 0 refills | Status: AC | PRN

## 2023-10-26 MED ORDER — POTASSIUM CHLORIDE CRYS ER 20 MEQ PO TBCR
40.0000 meq | EXTENDED_RELEASE_TABLET | Freq: Once | ORAL | Status: AC
Start: 2023-10-26 — End: 2023-10-26
  Administered 2023-10-26: 40 meq via ORAL
  Filled 2023-10-26: qty 2

## 2023-10-26 MED ORDER — IOHEXOL 300 MG/ML  SOLN
80.0000 mL | Freq: Once | INTRAMUSCULAR | Status: AC | PRN
  Administered 2023-10-26: 80 mL via INTRAVENOUS

## 2023-10-26 MED ORDER — MORPHINE SULFATE (PF) 4 MG/ML IV SOLN
4.0000 mg | Freq: Once | INTRAVENOUS | Status: DC
  Filled 2023-10-26: qty 1

## 2023-10-26 MED ORDER — ONDANSETRON HCL 4 MG/2ML IJ SOLN
4.0000 mg | Freq: Once | INTRAMUSCULAR | Status: AC
  Administered 2023-10-26: 4 mg via INTRAVENOUS
  Filled 2023-10-26: qty 2

## 2023-10-26 NOTE — ED Triage Notes (Signed)
 First Nurse Note: Patient to ED from [facility] for RLQ abd pain with  N/V/D. Pt has not ate since Sunday.

## 2023-10-26 NOTE — Discharge Instructions (Signed)

## 2023-10-26 NOTE — ED Triage Notes (Signed)
 See first nurse note. Pt in NAD

## 2023-10-26 NOTE — ED Notes (Signed)
 ONLY ONE MORPHINE  GIVEN:  bottom fell out of tube and it spilled on clothes and computer.  Provider to reorder.

## 2023-10-26 NOTE — ED Provider Notes (Signed)
 [facility] Provider Note    Event Date/Time   First MD Initiated Contact with Patient 10/26/23 1938     (approximate)   History   Abdominal Pain and Diarrhea   HPI  Jaclyn Hall is a 78 y.o. female female with a past medical history of hypertension, BPPV, asthma who presents with 3 days of worsening diarrhea and right-sided abdominal pain.  Patient denies any dark or bloody bowel movements.  Denies any fevers cough or shortness of breath.  Abdominal pain is crampy.  She has a prior history of cholecystectomy . she presents with her significant other who contributes to the history      Physical Exam   Triage Vital Signs: ED Triage Vitals  Encounter Vitals Group     BP 10/26/23 1448 (!) 143/77     Girls Systolic BP Percentile --      Girls Diastolic BP Percentile --      Boys Systolic BP Percentile --      Boys Diastolic BP Percentile --      Pulse Rate 10/26/23 1448 95     Resp 10/26/23 1448 20     Temp 10/26/23 1448 98.6 F (37 C)     Temp Source 10/26/23 1817 Oral     SpO2 10/26/23 1448 99 %     Weight 10/26/23 1448 180 lb (81.6 kg)     Height 10/26/23 1448 5' 1 (1.549 m)     Head Circumference --      Peak Flow --      Pain Score 10/26/23 1448 5     Pain Loc --      Pain Education --      Exclude from Growth Chart --     Most recent vital signs: Vitals:   10/26/23 1817 10/26/23 2232  BP: (!) 129/92 133/86  Pulse: 90 83  Resp: 20 19  Temp: 98.7 F (37.1 C) 98.2 F (36.8 C)  SpO2: 98% 99%    Nursing Triage Note reviewed. Vital signs reviewed and patients oxygen saturation is normoxic  General: Patient is well nourished, well developed, awake and alert, resting comfortably in no acute distress Head: Normocephalic and atraumatic Eyes: Normal inspection, extraocular muscles intact, no conjunctival pallor Ear, nose, throat: Normal external exam Neck: Normal range of motion Respiratory: Patient is in no respiratory distress,  lungs CTAB Cardiovascular: Patient is not tachycardic, RRR without murmur appreciated GI: Abd soft, ttp generally with no guarding or rebound  Back: Normal inspection of the back with good strength and range of motion throughout all ext Extremities: pulses intact with good cap refills, no LE pitting edema or calf tenderness Neuro: The patient is alert and oriented to person, place, and time, appropriately conversive, with 5/5 bilat UE/LE strength, no gross motor or sensory defects noted. Coordination appears to be adequate. Skin: Warm, dry, and intact Psych: normal mood and affect, no SI or HI  ED Results / Procedures / Treatments   Labs (all labs ordered are listed, but only abnormal results are displayed) Labs Reviewed  COMPREHENSIVE METABOLIC PANEL WITH GFR - Abnormal; Notable for the following components:      Result Value   Sodium 132 (*)    Potassium 2.9 (*)    Chloride 94 (*)    CO2 21 (*)    Glucose, Bld 119 (*)    Creatinine, Ser 1.33 (*)    AST 49 (*)    ALT 49 (*)    GFR, Estimated  41 (*)    Anion gap 17 (*)    All other components within normal limits  URINALYSIS, COMPLETE (UACMP) WITH MICROSCOPIC - Abnormal; Notable for the following components:   Color, Urine YELLOW (*)    APPearance CLEAR (*)    Specific Gravity, Urine >1.046 (*)    Hgb urine dipstick MODERATE (*)    Protein, ur 30 (*)    All other components within normal limits  LIPASE, BLOOD  CBC  MAGNESIUM     EKG EKG and rhythm strip are interpreted by myself:   EKG: [Normal sinus rhythm] at heart rate of 80, normal QRS duration, QTc 426, nonspecific ST segments and T waves no ectopy EKG not consistent with Acute STEMI Rhythm strip: NSR in lead II   RADIOLOGY CT abd and pelvis with iv contrast: No acute abnormality on my independent review interpretation radiologist agrees    PROCEDURES:  Critical Care performed: No  Procedures   MEDICATIONS ORDERED IN ED: Medications  morphine  (PF) 4  MG/ML injection 4 mg (4 mg Intravenous Not Given 10/26/23 2038)  sodium chloride  0.9 % bolus 1,000 mL (1,000 mLs Intravenous New Bag/Given 10/26/23 2028)  morphine  (PF) 4 MG/ML injection 4 mg (4 mg Intravenous Given 10/26/23 2030)  ondansetron  (ZOFRAN ) injection 4 mg (4 mg Intravenous Given 10/26/23 2029)  iohexol (OMNIPAQUE) 300 MG/ML solution 80 mL (80 mLs Intravenous Contrast Given 10/26/23 2043)  potassium chloride SA (KLOR-CON M) CR tablet 40 mEq (40 mEq Oral Given 10/26/23 2241)     IMPRESSION / MDM / ASSESSMENT AND PLAN / ED COURSE                                Differential diagnosis includes, but is not limited to, appendicitis colitis diverticulitis, UTI, electrolyte derangement, anemia  ED course: Patient presents and is well-appearing.  Blood work demonstrated a low potassium which was repleted.  She did have a mild acute renal insufficiency and IV fluids were given.  She was not anemic and had no leukocytosis.  Urinalysis was not consistent with infection.  CT abdomen and pelvis with contrast demonstrated no acute abnormality.  Patient adamantly denies any chest pain and EKG was ordered which demonstrated no evidence of acute ischemia.  Patient was able to ambulate tolerate p.o. and feels comfortable returning home.  She has an outpatient appointment scheduled for next month with her primary care physician but was encouraged to call this week to see if something sooner could be arranged.  All questions answered and patient voiced understanding and requested discharge   Clinical Course as of 10/26/23 2342  Wed Oct 26, 2023  2003 WBC: 4.0 No leukocytosis [HD]  2300 Urinalysis, Complete w Microscopic -Urine, Clean Catch(!) Unremarkable will work on discharge [HD]    Clinical Course User Index [HD] Nicholaus Rolland BRAVO, MD   At time of discharge there is no evidence of acute life, limb, vision, or fertility threat. Patient has stable vital signs, pain is well controlled, patient is  ambulatory and p.o. tolerant.  Discharge instructions were completed using the EPIC system. I would refer you to those at this time. All warnings prescriptions follow-up etc. were discussed in detail with the patient. Patient indicates understanding and is agreeable with this plan. All questions answered.  Patient is made aware that they may return to the emergency department for any worsening or new condition or for any other emergency.   -- Risk: 5  This patient has a high risk of morbidity due to further diagnostic testing or treatment. Rationale: This patient's evaluation and management involve a high risk of morbidity due to the potential severity of presenting symptoms, need for diagnostic testing, and/or initiation of treatment that may require close monitoring. The differential includes conditions with potential for significant deterioration or requiring escalation of care. Treatment decisions in the ED, including medication administration, procedural interventions, or disposition planning, reflect this level of risk. COPA: 5 The patient has the following acute or chronic illness/injury that poses a possible threat to life or bodily function: [X] : The patient has a potentially serious acute condition or an acute exacerbation of a chronic illness requiring urgent evaluation and management in the Emergency Department. The clinical presentation necessitates immediate consideration of life-threatening or function-threatening diagnoses, even if they are ultimately ruled out.   FINAL CLINICAL IMPRESSION(S) / ED DIAGNOSES   Final diagnoses:  Diarrhea, unspecified type  Abdominal pain, unspecified abdominal location     Rx / DC Orders   ED Discharge Orders          Ordered    ondansetron  (ZOFRAN -ODT) 4 MG disintegrating tablet  Every 8 hours PRN        10/26/23 2306    oxyCODONE -acetaminophen  (PERCOCET) 5-325 MG tablet  Every 4 hours PRN        10/26/23 2306             Note:   This document was prepared using Dragon voice recognition software and may include unintentional dictation errors.   Nicholaus Rolland BRAVO, MD 10/26/23 [phone]

## 2023-11-15 DIAGNOSIS — H5203 Hypermetropia, bilateral: Secondary | ICD-10-CM | POA: Diagnosis not present

## 2023-11-15 DIAGNOSIS — H259 Unspecified age-related cataract: Secondary | ICD-10-CM | POA: Diagnosis not present

## 2023-11-15 DIAGNOSIS — H52223 Regular astigmatism, bilateral: Secondary | ICD-10-CM | POA: Diagnosis not present

## 2023-11-15 DIAGNOSIS — H524 Presbyopia: Secondary | ICD-10-CM | POA: Diagnosis not present

## 2023-12-05 DIAGNOSIS — I10 Essential (primary) hypertension: Secondary | ICD-10-CM | POA: Diagnosis not present

## 2023-12-05 DIAGNOSIS — H8113 Benign paroxysmal vertigo, bilateral: Secondary | ICD-10-CM | POA: Diagnosis not present

## 2023-12-05 DIAGNOSIS — Z0000 Encounter for general adult medical examination without abnormal findings: Secondary | ICD-10-CM | POA: Diagnosis not present

## 2023-12-05 DIAGNOSIS — J4520 Mild intermittent asthma, uncomplicated: Secondary | ICD-10-CM | POA: Diagnosis not present

## 2023-12-05 DIAGNOSIS — R7303 Prediabetes: Secondary | ICD-10-CM | POA: Diagnosis not present

## 2023-12-05 DIAGNOSIS — Z1331 Encounter for screening for depression: Secondary | ICD-10-CM | POA: Diagnosis not present
